# Patient Record
Sex: Female | Born: 1935 | Race: White | Hispanic: No | Smoking: Former smoker
Health system: Southern US, Community
[De-identification: ages and names within clinical notes are randomized; demographics above are authoritative.]

## PROBLEM LIST (undated history)

## (undated) DIAGNOSIS — R002 Palpitations: Secondary | ICD-10-CM

## (undated) DIAGNOSIS — C50919 Malignant neoplasm of unspecified site of unspecified female breast: Secondary | ICD-10-CM

## (undated) DIAGNOSIS — I129 Hypertensive chronic kidney disease with stage 1 through stage 4 chronic kidney disease, or unspecified chronic kidney disease: Secondary | ICD-10-CM

## (undated) DIAGNOSIS — E782 Mixed hyperlipidemia: Secondary | ICD-10-CM

## (undated) DIAGNOSIS — R7303 Prediabetes: Secondary | ICD-10-CM

## (undated) DIAGNOSIS — I4891 Unspecified atrial fibrillation: Secondary | ICD-10-CM

## (undated) DIAGNOSIS — F419 Anxiety disorder, unspecified: Secondary | ICD-10-CM

## (undated) HISTORY — PX: OTHER SURGICAL HISTORY: SHX169

## (undated) HISTORY — PX: BREAST LUMPECTOMY: SHX2

## (undated) HISTORY — DX: Hypertensive chronic kidney disease with stage 1 through stage 4 chronic kidney disease, or unspecified chronic kidney disease: I12.9

## (undated) HISTORY — DX: Malignant neoplasm of unspecified site of unspecified female breast: C50.919

## (undated) HISTORY — DX: Mixed hyperlipidemia: E78.2

## (undated) HISTORY — DX: Anxiety disorder, unspecified: F41.9

## (undated) HISTORY — PX: CARDIOVASCULAR STRESS TEST: SHX262

## (undated) HISTORY — DX: Palpitations: R00.2

---

## 1996-04-02 HISTORY — PX: BREAST LUMPECTOMY: SHX2

## 1997-09-10 ENCOUNTER — Other Ambulatory Visit: Admission: RE | Admit: 1997-09-10 | Discharge: 1997-09-10 | Payer: Self-pay | Admitting: Hematology and Oncology

## 1998-06-20 ENCOUNTER — Other Ambulatory Visit: Admission: RE | Admit: 1998-06-20 | Discharge: 1998-06-20 | Payer: Self-pay | Admitting: Emergency Medicine

## 1998-09-30 ENCOUNTER — Ambulatory Visit (HOSPITAL_COMMUNITY): Admission: RE | Admit: 1998-09-30 | Discharge: 1998-09-30 | Payer: Self-pay | Admitting: Gastroenterology

## 1998-09-30 ENCOUNTER — Encounter: Payer: Self-pay | Admitting: Gastroenterology

## 1999-06-12 ENCOUNTER — Other Ambulatory Visit: Admission: RE | Admit: 1999-06-12 | Discharge: 1999-06-12 | Payer: Self-pay | Admitting: Emergency Medicine

## 1999-06-27 ENCOUNTER — Encounter: Payer: Self-pay | Admitting: Hematology and Oncology

## 1999-06-27 ENCOUNTER — Encounter: Admission: RE | Admit: 1999-06-27 | Discharge: 1999-06-27 | Payer: Self-pay | Admitting: Hematology and Oncology

## 1999-12-19 ENCOUNTER — Encounter: Admission: RE | Admit: 1999-12-19 | Discharge: 1999-12-19 | Payer: Self-pay | Admitting: Hematology and Oncology

## 1999-12-19 ENCOUNTER — Encounter: Payer: Self-pay | Admitting: Hematology and Oncology

## 2000-06-19 ENCOUNTER — Other Ambulatory Visit: Admission: RE | Admit: 2000-06-19 | Discharge: 2000-06-19 | Payer: Self-pay | Admitting: Emergency Medicine

## 2000-07-02 ENCOUNTER — Encounter: Payer: Self-pay | Admitting: Hematology and Oncology

## 2000-07-02 ENCOUNTER — Ambulatory Visit (HOSPITAL_COMMUNITY): Admission: RE | Admit: 2000-07-02 | Discharge: 2000-07-02 | Payer: Self-pay | Admitting: Hematology and Oncology

## 2000-08-17 ENCOUNTER — Encounter: Payer: Self-pay | Admitting: Emergency Medicine

## 2000-08-17 ENCOUNTER — Emergency Department (HOSPITAL_COMMUNITY): Admission: EM | Admit: 2000-08-17 | Discharge: 2000-08-17 | Payer: Self-pay | Admitting: Emergency Medicine

## 2000-12-19 ENCOUNTER — Encounter: Admission: RE | Admit: 2000-12-19 | Discharge: 2000-12-19 | Payer: Self-pay | Admitting: Hematology and Oncology

## 2000-12-19 ENCOUNTER — Encounter: Payer: Self-pay | Admitting: Hematology and Oncology

## 2001-12-03 ENCOUNTER — Encounter: Payer: Self-pay | Admitting: Hematology and Oncology

## 2001-12-03 ENCOUNTER — Encounter: Admission: RE | Admit: 2001-12-03 | Discharge: 2001-12-03 | Payer: Self-pay | Admitting: Hematology and Oncology

## 2002-05-22 ENCOUNTER — Encounter: Admission: RE | Admit: 2002-05-22 | Discharge: 2002-05-22 | Payer: Self-pay | Admitting: Emergency Medicine

## 2002-05-22 ENCOUNTER — Encounter: Payer: Self-pay | Admitting: Emergency Medicine

## 2002-10-02 ENCOUNTER — Other Ambulatory Visit: Admission: RE | Admit: 2002-10-02 | Discharge: 2002-10-02 | Payer: Self-pay | Admitting: Emergency Medicine

## 2002-12-29 ENCOUNTER — Encounter: Admission: RE | Admit: 2002-12-29 | Discharge: 2002-12-29 | Payer: Self-pay | Admitting: *Deleted

## 2002-12-29 ENCOUNTER — Encounter: Payer: Self-pay | Admitting: *Deleted

## 2003-06-05 ENCOUNTER — Emergency Department (HOSPITAL_COMMUNITY): Admission: EM | Admit: 2003-06-05 | Discharge: 2003-06-05 | Payer: Self-pay | Admitting: Emergency Medicine

## 2004-01-04 ENCOUNTER — Encounter: Admission: RE | Admit: 2004-01-04 | Discharge: 2004-01-04 | Payer: Self-pay | Admitting: Oncology

## 2004-08-16 ENCOUNTER — Ambulatory Visit: Payer: Self-pay | Admitting: Internal Medicine

## 2004-11-05 ENCOUNTER — Emergency Department (HOSPITAL_COMMUNITY): Admission: EM | Admit: 2004-11-05 | Discharge: 2004-11-05 | Payer: Self-pay | Admitting: Emergency Medicine

## 2005-01-31 ENCOUNTER — Encounter: Admission: RE | Admit: 2005-01-31 | Discharge: 2005-01-31 | Payer: Self-pay | Admitting: Internal Medicine

## 2005-02-07 ENCOUNTER — Ambulatory Visit: Payer: Self-pay | Admitting: Internal Medicine

## 2005-02-08 ENCOUNTER — Ambulatory Visit (HOSPITAL_COMMUNITY): Admission: RE | Admit: 2005-02-08 | Discharge: 2005-02-08 | Payer: Self-pay | Admitting: Internal Medicine

## 2005-03-07 DIAGNOSIS — I129 Hypertensive chronic kidney disease with stage 1 through stage 4 chronic kidney disease, or unspecified chronic kidney disease: Secondary | ICD-10-CM

## 2005-03-07 HISTORY — DX: Hypertensive chronic kidney disease with stage 1 through stage 4 chronic kidney disease, or unspecified chronic kidney disease: I12.9

## 2005-08-07 ENCOUNTER — Ambulatory Visit: Payer: Self-pay | Admitting: Internal Medicine

## 2005-08-09 LAB — CBC WITH DIFFERENTIAL/PLATELET
BASO%: 1.1 % (ref 0.0–2.0)
HCT: 36.7 % (ref 34.8–46.6)
LYMPH%: 32.2 % (ref 14.0–48.0)
MCHC: 34.3 g/dL (ref 32.0–36.0)
MCV: 92 fL (ref 81.0–101.0)
MONO#: 0.6 10*3/uL (ref 0.1–0.9)
MONO%: 14 % — ABNORMAL HIGH (ref 0.0–13.0)
NEUT%: 47.4 % (ref 39.6–76.8)
Platelets: 168 10*3/uL (ref 145–400)
RBC: 3.99 10*6/uL (ref 3.70–5.32)

## 2005-08-09 LAB — COMPREHENSIVE METABOLIC PANEL
Alkaline Phosphatase: 65 U/L (ref 39–117)
CO2: 28 mEq/L (ref 19–32)
Creatinine, Ser: 0.8 mg/dL (ref 0.4–1.2)
Glucose, Bld: 123 mg/dL — ABNORMAL HIGH (ref 70–99)
Sodium: 140 mEq/L (ref 135–145)
Total Bilirubin: 0.4 mg/dL (ref 0.3–1.2)
Total Protein: 6.3 g/dL (ref 6.0–8.3)

## 2005-08-09 LAB — CANCER ANTIGEN 27.29: CA 27.29: 43 U/mL — ABNORMAL HIGH (ref 0–39)

## 2006-02-01 ENCOUNTER — Encounter: Admission: RE | Admit: 2006-02-01 | Discharge: 2006-02-01 | Payer: Self-pay | Admitting: Emergency Medicine

## 2006-02-05 ENCOUNTER — Ambulatory Visit: Payer: Self-pay | Admitting: Internal Medicine

## 2006-02-07 ENCOUNTER — Ambulatory Visit (HOSPITAL_COMMUNITY): Admission: RE | Admit: 2006-02-07 | Discharge: 2006-02-07 | Payer: Self-pay | Admitting: Internal Medicine

## 2006-02-07 LAB — CBC WITH DIFFERENTIAL/PLATELET
BASO%: 0.4 % (ref 0.0–2.0)
Basophils Absolute: 0 10*3/uL (ref 0.0–0.1)
EOS%: 2.8 % (ref 0.0–7.0)
Eosinophils Absolute: 0.2 10*3/uL (ref 0.0–0.5)
HCT: 36 % (ref 34.8–46.6)
HGB: 12.4 g/dL (ref 11.6–15.9)
LYMPH%: 23.4 % (ref 14.0–48.0)
MCH: 31.8 pg (ref 26.0–34.0)
MCHC: 34.3 g/dL (ref 32.0–36.0)
MCV: 92.5 fL (ref 81.0–101.0)
MONO#: 0.6 10*3/uL (ref 0.1–0.9)
MONO%: 9.4 % (ref 0.0–13.0)
NEUT#: 3.9 10*3/uL (ref 1.5–6.5)
NEUT%: 64 % (ref 39.6–76.8)
Platelets: 195 10*3/uL (ref 145–400)
RBC: 3.89 10*6/uL (ref 3.70–5.32)
RDW: 13.1 % (ref 11.3–14.5)
WBC: 6.1 10*3/uL (ref 3.9–10.0)
lymph#: 1.4 10*3/uL (ref 0.9–3.3)

## 2006-02-07 LAB — COMPREHENSIVE METABOLIC PANEL
ALT: 16 U/L (ref 0–35)
AST: 18 U/L (ref 0–37)
Albumin: 4.1 g/dL (ref 3.5–5.2)
Alkaline Phosphatase: 70 U/L (ref 39–117)
BUN: 17 mg/dL (ref 6–23)
CO2: 30 mEq/L (ref 19–32)
Calcium: 9.8 mg/dL (ref 8.4–10.5)
Chloride: 101 mEq/L (ref 96–112)
Creatinine, Ser: 0.74 mg/dL (ref 0.40–1.20)
Glucose, Bld: 109 mg/dL — ABNORMAL HIGH (ref 70–99)
Potassium: 4.5 mEq/L (ref 3.5–5.3)
Sodium: 139 mEq/L (ref 135–145)
Total Bilirubin: 0.4 mg/dL (ref 0.3–1.2)
Total Protein: 6.5 g/dL (ref 6.0–8.3)

## 2006-02-07 LAB — CANCER ANTIGEN 27.29: CA 27.29: 34 U/mL (ref 0–39)

## 2006-08-06 ENCOUNTER — Ambulatory Visit: Payer: Self-pay | Admitting: Internal Medicine

## 2006-08-08 LAB — CBC WITH DIFFERENTIAL/PLATELET
Basophils Absolute: 0 10*3/uL (ref 0.0–0.1)
Eosinophils Absolute: 0.2 10*3/uL (ref 0.0–0.5)
HCT: 36 % (ref 34.8–46.6)
HGB: 12.7 g/dL (ref 11.6–15.9)
LYMPH%: 25 % (ref 14.0–48.0)
MONO#: 0.8 10*3/uL (ref 0.1–0.9)
NEUT%: 54.2 % (ref 39.6–76.8)
Platelets: 176 10*3/uL (ref 145–400)
WBC: 4.9 10*3/uL (ref 3.9–10.0)
lymph#: 1.2 10*3/uL (ref 0.9–3.3)

## 2006-08-08 LAB — COMPREHENSIVE METABOLIC PANEL
ALT: 17 U/L (ref 0–35)
BUN: 16 mg/dL (ref 6–23)
CO2: 30 mEq/L (ref 19–32)
Calcium: 9.7 mg/dL (ref 8.4–10.5)
Chloride: 103 mEq/L (ref 96–112)
Creatinine, Ser: 0.73 mg/dL (ref 0.40–1.20)
Glucose, Bld: 79 mg/dL (ref 70–99)
Total Bilirubin: 0.5 mg/dL (ref 0.3–1.2)

## 2006-08-08 LAB — CANCER ANTIGEN 27.29: CA 27.29: 39 U/mL (ref 0–39)

## 2007-02-03 ENCOUNTER — Ambulatory Visit: Payer: Self-pay | Admitting: Internal Medicine

## 2007-02-04 ENCOUNTER — Encounter: Admission: RE | Admit: 2007-02-04 | Discharge: 2007-02-04 | Payer: Self-pay | Admitting: Emergency Medicine

## 2007-02-05 ENCOUNTER — Ambulatory Visit (HOSPITAL_COMMUNITY): Admission: RE | Admit: 2007-02-05 | Discharge: 2007-02-05 | Payer: Self-pay | Admitting: Internal Medicine

## 2007-02-05 LAB — CBC WITH DIFFERENTIAL/PLATELET
BASO%: 1.1 % (ref 0.0–2.0)
Basophils Absolute: 0.1 10*3/uL (ref 0.0–0.1)
EOS%: 4 % (ref 0.0–7.0)
HGB: 12.8 g/dL (ref 11.6–15.9)
MCH: 31.9 pg (ref 26.0–34.0)
MCV: 91.3 fL (ref 81.0–101.0)
MONO%: 16.2 % — ABNORMAL HIGH (ref 0.0–13.0)
RBC: 4.02 10*6/uL (ref 3.70–5.32)
RDW: 13.1 % (ref 11.3–14.5)
lymph#: 1.4 10*3/uL (ref 0.9–3.3)

## 2007-02-05 LAB — COMPREHENSIVE METABOLIC PANEL
ALT: 18 U/L (ref 0–35)
AST: 20 U/L (ref 0–37)
Albumin: 4.2 g/dL (ref 3.5–5.2)
Alkaline Phosphatase: 76 U/L (ref 39–117)
BUN: 21 mg/dL (ref 6–23)
Calcium: 9.8 mg/dL (ref 8.4–10.5)
Chloride: 103 mEq/L (ref 96–112)
Potassium: 3.4 mEq/L — ABNORMAL LOW (ref 3.5–5.3)
Sodium: 141 mEq/L (ref 135–145)
Total Protein: 6.8 g/dL (ref 6.0–8.3)

## 2007-11-03 ENCOUNTER — Encounter: Admission: RE | Admit: 2007-11-03 | Discharge: 2007-11-03 | Payer: Self-pay | Admitting: Family Medicine

## 2008-02-05 ENCOUNTER — Encounter: Admission: RE | Admit: 2008-02-05 | Discharge: 2008-02-05 | Payer: Self-pay | Admitting: Internal Medicine

## 2008-02-10 ENCOUNTER — Ambulatory Visit: Payer: Self-pay | Admitting: Internal Medicine

## 2008-02-12 LAB — CBC WITH DIFFERENTIAL/PLATELET
BASO%: 0.8 % (ref 0.0–2.0)
Basophils Absolute: 0 10*3/uL (ref 0.0–0.1)
EOS%: 3.7 % (ref 0.0–7.0)
Eosinophils Absolute: 0.2 10*3/uL (ref 0.0–0.5)
HCT: 36 % (ref 34.8–46.6)
HGB: 12.4 g/dL (ref 11.6–15.9)
LYMPH%: 22.8 % (ref 14.0–48.0)
MCH: 32 pg (ref 26.0–34.0)
MCHC: 34.4 g/dL (ref 32.0–36.0)
MCV: 92.9 fL (ref 81.0–101.0)
MONO#: 0.9 10*3/uL (ref 0.1–0.9)
MONO%: 15.9 % — ABNORMAL HIGH (ref 0.0–13.0)
NEUT#: 3.1 10*3/uL (ref 1.5–6.5)
NEUT%: 56.8 % (ref 39.6–76.8)
Platelets: 182 10*3/uL (ref 145–400)
RBC: 3.87 10*6/uL (ref 3.70–5.32)
RDW: 13.4 % (ref 11.3–14.5)
WBC: 5.5 10*3/uL (ref 3.9–10.0)
lymph#: 1.2 10*3/uL (ref 0.9–3.3)

## 2008-02-12 LAB — COMPREHENSIVE METABOLIC PANEL
ALT: 18 U/L (ref 0–35)
AST: 20 U/L (ref 0–37)
Albumin: 4.1 g/dL (ref 3.5–5.2)
Alkaline Phosphatase: 63 U/L (ref 39–117)
BUN: 19 mg/dL (ref 6–23)
CO2: 29 mEq/L (ref 19–32)
Calcium: 9.7 mg/dL (ref 8.4–10.5)
Chloride: 100 mEq/L (ref 96–112)
Creatinine, Ser: 0.79 mg/dL (ref 0.40–1.20)
Glucose, Bld: 87 mg/dL (ref 70–99)
Potassium: 3.8 mEq/L (ref 3.5–5.3)
Sodium: 137 mEq/L (ref 135–145)
Total Bilirubin: 0.5 mg/dL (ref 0.3–1.2)
Total Protein: 6.3 g/dL (ref 6.0–8.3)

## 2008-02-12 LAB — CANCER ANTIGEN 27.29: CA 27.29: 42 U/mL — ABNORMAL HIGH (ref 0–39)

## 2008-02-19 ENCOUNTER — Ambulatory Visit (HOSPITAL_COMMUNITY): Admission: RE | Admit: 2008-02-19 | Discharge: 2008-02-19 | Payer: Self-pay | Admitting: Internal Medicine

## 2009-02-03 ENCOUNTER — Ambulatory Visit: Payer: Self-pay | Admitting: Internal Medicine

## 2009-02-07 ENCOUNTER — Ambulatory Visit (HOSPITAL_COMMUNITY): Admission: RE | Admit: 2009-02-07 | Discharge: 2009-02-07 | Payer: Self-pay | Admitting: Internal Medicine

## 2009-02-07 LAB — CBC WITH DIFFERENTIAL/PLATELET
BASO%: 0.7 % (ref 0.0–2.0)
Basophils Absolute: 0 10*3/uL (ref 0.0–0.1)
EOS%: 4.9 % (ref 0.0–7.0)
Eosinophils Absolute: 0.2 10*3/uL (ref 0.0–0.5)
HCT: 36.5 % (ref 34.8–46.6)
HGB: 12.5 g/dL (ref 11.6–15.9)
LYMPH%: 25.8 % (ref 14.0–49.7)
MCH: 32.2 pg (ref 25.1–34.0)
MCHC: 34.3 g/dL (ref 31.5–36.0)
MCV: 93.8 fL (ref 79.5–101.0)
MONO#: 0.8 10*3/uL (ref 0.1–0.9)
MONO%: 15.3 % — ABNORMAL HIGH (ref 0.0–14.0)
NEUT#: 2.7 10*3/uL (ref 1.5–6.5)
NEUT%: 53.3 % (ref 38.4–76.8)
Platelets: 162 10*3/uL (ref 145–400)
RBC: 3.89 10*6/uL (ref 3.70–5.45)
RDW: 13.5 % (ref 11.2–14.5)
WBC: 5 10*3/uL (ref 3.9–10.3)
lymph#: 1.3 10*3/uL (ref 0.9–3.3)

## 2009-02-07 LAB — COMPREHENSIVE METABOLIC PANEL
ALT: 12 U/L (ref 0–35)
AST: 17 U/L (ref 0–37)
Albumin: 4 g/dL (ref 3.5–5.2)
Alkaline Phosphatase: 64 U/L (ref 39–117)
BUN: 19 mg/dL (ref 6–23)
CO2: 31 mEq/L (ref 19–32)
Calcium: 9.6 mg/dL (ref 8.4–10.5)
Chloride: 104 mEq/L (ref 96–112)
Creatinine, Ser: 0.75 mg/dL (ref 0.40–1.20)
Glucose, Bld: 112 mg/dL — ABNORMAL HIGH (ref 70–99)
Potassium: 3.9 mEq/L (ref 3.5–5.3)
Sodium: 141 mEq/L (ref 135–145)
Total Bilirubin: 0.4 mg/dL (ref 0.3–1.2)
Total Protein: 6 g/dL (ref 6.0–8.3)

## 2009-02-07 LAB — CANCER ANTIGEN 27.29: CA 27.29: 38 U/mL (ref 0–39)

## 2009-02-08 ENCOUNTER — Encounter: Admission: RE | Admit: 2009-02-08 | Discharge: 2009-02-08 | Payer: Self-pay | Admitting: Internal Medicine

## 2010-02-09 ENCOUNTER — Encounter: Admission: RE | Admit: 2010-02-09 | Discharge: 2010-02-09 | Payer: Self-pay | Admitting: Internal Medicine

## 2010-03-07 ENCOUNTER — Ambulatory Visit: Payer: Self-pay | Admitting: Internal Medicine

## 2010-03-09 ENCOUNTER — Ambulatory Visit (HOSPITAL_COMMUNITY)
Admission: RE | Admit: 2010-03-09 | Discharge: 2010-03-09 | Payer: Self-pay | Source: Home / Self Care | Admitting: Internal Medicine

## 2010-03-09 LAB — CBC WITH DIFFERENTIAL/PLATELET
BASO%: 0.6 % (ref 0.0–2.0)
Basophils Absolute: 0 10*3/uL (ref 0.0–0.1)
EOS%: 2.5 % (ref 0.0–7.0)
Eosinophils Absolute: 0.2 10*3/uL (ref 0.0–0.5)
HCT: 37.9 % (ref 34.8–46.6)
HGB: 12.7 g/dL (ref 11.6–15.9)
LYMPH%: 34 % (ref 14.0–49.7)
MCH: 30.8 pg (ref 25.1–34.0)
MCHC: 33.5 g/dL (ref 31.5–36.0)
MCV: 92 fL (ref 79.5–101.0)
MONO#: 0.9 10*3/uL (ref 0.1–0.9)
MONO%: 13.7 % (ref 0.0–14.0)
NEUT#: 3.1 10*3/uL (ref 1.5–6.5)
NEUT%: 49.2 % (ref 38.4–76.8)
Platelets: 185 10*3/uL (ref 145–400)
RBC: 4.12 10*6/uL (ref 3.70–5.45)
RDW: 13.4 % (ref 11.2–14.5)
WBC: 6.4 10*3/uL (ref 3.9–10.3)
lymph#: 2.2 10*3/uL (ref 0.9–3.3)

## 2010-03-09 LAB — COMPREHENSIVE METABOLIC PANEL
ALT: 15 U/L (ref 0–35)
AST: 21 U/L (ref 0–37)
Albumin: 4.2 g/dL (ref 3.5–5.2)
Alkaline Phosphatase: 58 U/L (ref 39–117)
BUN: 18 mg/dL (ref 6–23)
CO2: 31 mEq/L (ref 19–32)
Calcium: 9.6 mg/dL (ref 8.4–10.5)
Chloride: 100 mEq/L (ref 96–112)
Creatinine, Ser: 0.67 mg/dL (ref 0.40–1.20)
Glucose, Bld: 115 mg/dL — ABNORMAL HIGH (ref 70–99)
Potassium: 3.9 mEq/L (ref 3.5–5.3)
Sodium: 140 mEq/L (ref 135–145)
Total Bilirubin: 0.4 mg/dL (ref 0.3–1.2)
Total Protein: 6.2 g/dL (ref 6.0–8.3)

## 2010-03-09 LAB — CANCER ANTIGEN 27.29: CA 27.29: 42 U/mL — ABNORMAL HIGH (ref 0–39)

## 2011-01-30 ENCOUNTER — Other Ambulatory Visit: Payer: Self-pay | Admitting: Internal Medicine

## 2011-01-30 DIAGNOSIS — Z1231 Encounter for screening mammogram for malignant neoplasm of breast: Secondary | ICD-10-CM

## 2011-03-13 ENCOUNTER — Ambulatory Visit
Admission: RE | Admit: 2011-03-13 | Discharge: 2011-03-13 | Disposition: A | Discharge status: Home / Self Care | Payer: Medicare Other | Source: Ambulatory Visit | Attending: Internal Medicine | Admitting: Internal Medicine

## 2011-03-13 DIAGNOSIS — Z1231 Encounter for screening mammogram for malignant neoplasm of breast: Secondary | ICD-10-CM

## 2011-03-16 ENCOUNTER — Ambulatory Visit (HOSPITAL_COMMUNITY)
Admission: RE | Admit: 2011-03-16 | Discharge: 2011-03-16 | Disposition: A | Discharge status: Home / Self Care | Payer: Medicare Other | Source: Ambulatory Visit | Attending: Internal Medicine | Admitting: Internal Medicine

## 2011-03-16 ENCOUNTER — Other Ambulatory Visit: Payer: Self-pay | Admitting: Internal Medicine

## 2011-03-16 ENCOUNTER — Other Ambulatory Visit (HOSPITAL_BASED_OUTPATIENT_CLINIC_OR_DEPARTMENT_OTHER): Payer: Medicare Other | Admitting: Lab

## 2011-03-16 DIAGNOSIS — C50919 Malignant neoplasm of unspecified site of unspecified female breast: Secondary | ICD-10-CM | POA: Insufficient documentation

## 2011-03-16 DIAGNOSIS — Z170 Estrogen receptor positive status [ER+]: Secondary | ICD-10-CM

## 2011-03-16 LAB — CBC WITH DIFFERENTIAL/PLATELET
EOS%: 2.3 % (ref 0.0–7.0)
Eosinophils Absolute: 0.2 10*3/uL (ref 0.0–0.5)
LYMPH%: 25.2 % (ref 14.0–49.7)
MCH: 30.3 pg (ref 25.1–34.0)
MCHC: 33.2 g/dL (ref 31.5–36.0)
MCV: 91 fL (ref 79.5–101.0)
MONO%: 13.9 % (ref 0.0–14.0)
NEUT#: 4.1 10*3/uL (ref 1.5–6.5)
Platelets: 185 10*3/uL (ref 145–400)
RBC: 4.23 10*6/uL (ref 3.70–5.45)
RDW: 13.4 % (ref 11.2–14.5)

## 2011-03-16 LAB — COMPREHENSIVE METABOLIC PANEL
ALT: 16 U/L (ref 0–35)
AST: 26 U/L (ref 0–37)
CO2: 28 mEq/L (ref 19–32)
Creatinine, Ser: 0.76 mg/dL (ref 0.50–1.10)
Total Bilirubin: 0.4 mg/dL (ref 0.3–1.2)

## 2011-03-16 LAB — CANCER ANTIGEN 27.29: CA 27.29: 39 U/mL (ref 0–39)

## 2011-03-19 ENCOUNTER — Encounter: Payer: Self-pay | Admitting: Internal Medicine

## 2011-03-19 ENCOUNTER — Telehealth: Payer: Self-pay | Admitting: Internal Medicine

## 2011-03-19 ENCOUNTER — Ambulatory Visit (HOSPITAL_BASED_OUTPATIENT_CLINIC_OR_DEPARTMENT_OTHER): Payer: Medicare Other | Admitting: Internal Medicine

## 2011-03-19 VITALS — BP 138/74 | HR 72 | Temp 96.8°F | Wt 197.7 lb

## 2011-03-19 DIAGNOSIS — C50919 Malignant neoplasm of unspecified site of unspecified female breast: Secondary | ICD-10-CM

## 2011-03-19 DIAGNOSIS — Z853 Personal history of malignant neoplasm of breast: Secondary | ICD-10-CM

## 2011-03-19 DIAGNOSIS — Z923 Personal history of irradiation: Secondary | ICD-10-CM

## 2011-03-19 NOTE — Progress Notes (Signed)
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  PRIOR THERAPY:  1. Status post left breast lumpectomy on 01/31/1998 with left axillary lymph node dissection, and the tumor was ER/PR positive, and there was no positive lymph node identified. 2. Status post radiotherapy to the left breast. 3. Status post 5 years of treatment with tamoxifen, ended 04/2002. 4. Status post 5 years of treatment with Femara 2.5 mg p.o. daily, completed 04/2007.  CURRENT THERAPY: Observation.  INTERVAL HISTORY: Alexandra Luna 77 y.o. female returns to the clinic today for annual followup visit. The patient is doing well today. She has no specific complaints. She denied having any significant chest pain or shortness breath, no cough or hemoptysis, no weight loss or night sweats. She had a recent chest x-ray as well as annual mammogram performaned in addition to blood work and she is here for evaluation and discussion of her lab and imaging studies.  MEDICAL HISTORY: Past Medical History  Diagnosis Date  . Breast cancer     ALLERGIES:  is allergic to adhesive; other; and penicillins.  MEDICATIONS:  Current Outpatient Prescriptions  Medication Sig Dispense Refill  . acetaminophen (TYLENOL) 500 MG tablet Take 500 mg by mouth every 6 (six) hours as needed.        . ALPRAZolam (XANAX) 0.25 MG tablet Take 0.25 mg by mouth at bedtime as needed.        Marland Kitchen atenolol (TENORMIN) 100 MG tablet Take 100 mg by mouth daily.        . calcium carbonate (OS-CAL) 600 MG TABS Take 600 mg by mouth 2 (two) times daily with a meal.        . fish oil-omega-3 fatty acids 1000 MG capsule Take 900 mg by mouth daily.        . hydrochlorothiazide (HYDRODIURIL) 25 MG tablet Take 25 mg by mouth daily.        Marland Kitchen loratadine (CLARITIN) 10 MG tablet Take 10 mg by mouth daily.        . Multiple Vitamins-Minerals (CENTRUM PO) Take by mouth daily.        .  rosuvastatin (CRESTOR) 10 MG tablet Take 10 mg by mouth daily.        . verapamil (COVERA HS) 240 MG (CO) 24 hr tablet Take 240 mg by mouth daily.          SURGICAL HISTORY:  Past Surgical History  Procedure Date  . Breast lumpectomy   . Tonsillectomy   . Cesarean section     x 3    REVIEW OF SYSTEMS:  A comprehensive review of systems was negative.   PHYSICAL EXAMINATION: General appearance: alert, cooperative and no distress Head: Normocephalic, without obvious abnormality, atraumatic Neck: no adenopathy Lymph nodes: Cervical, supraclavicular, and axillary nodes normal. Resp: clear to auscultation bilaterally Cardio: regular rate and rhythm, S1, S2 normal, no murmur, click, rub or gallop GI: soft, non-tender; bowel sounds normal; no masses,  no organomegaly Extremities: extremities normal, atraumatic, no cyanosis or edema Neurologic: Alert and oriented X 3, normal strength and tone. Normal symmetric reflexes. Normal coordination and gait Breast: no palpable masses. ECOG PERFORMANCE STATUS: 0 - Asymptomatic  Blood pressure 138/74, pulse 72, temperature 96.8 F (36 C), weight 197 lb 11.2 oz (89.676 kg).  LABORATORY DATA: Lab Results  Component Value Date   WBC 7.0 03/16/2011   HGB 12.8 03/16/2011   HCT 38.5 03/16/2011   MCV  91.0 03/16/2011   PLT 185 03/16/2011      Chemistry      Component Value Date/Time   NA 137 03/16/2011 1355   NA 137 03/16/2011 1355   K 4.1 03/16/2011 1355   K 4.1 03/16/2011 1355   CL 99 03/16/2011 1355   CL 99 03/16/2011 1355   CO2 28 03/16/2011 1355   CO2 28 03/16/2011 1355   BUN 20 03/16/2011 1355   BUN 20 03/16/2011 1355   CREATININE 0.76 03/16/2011 1355   CREATININE 0.76 03/16/2011 1355      Component Value Date/Time   CALCIUM 10.1 03/16/2011 1355   CALCIUM 10.1 03/16/2011 1355   ALKPHOS 55 03/16/2011 1355   ALKPHOS 55 03/16/2011 1355   AST 26 03/16/2011 1355   AST 26 03/16/2011 1355   ALT 16 03/16/2011 1355   ALT 16  03/16/2011 1355   BILITOT 0.4 03/16/2011 1355   BILITOT 0.4 03/16/2011 1355       RADIOGRAPHIC STUDIES: Dg Chest 2 View  03/16/2011  *RADIOLOGY REPORT*  Clinical Data: Breast cancer.  Chest two-view  Comparison:  03/09/2010.  Findings: Trachea is midline.  Heart size stable.  Biapical pleural thickening.  Probable mild scarring at the lung bases.  Lungs are otherwise clear.  No pleural fluid.  Surgical clips in the left axilla with postoperative changes in the left breast.  IMPRESSION: No acute findings.  Original Report Authenticated By: Reyes Ivan, M.D.   Mm Digital Screening  03/16/2011  DG SCREEN MAMMOGRAM BILATERAL Bilateral CC and MLO view(s) were taken.  DIGITAL SCREENING MAMMOGRAM WITH CAD: There are scattered fibroglandular densities.  No masses or malignant type calcifications are  identified.  Compared with prior studies.  Images were processed with CAD.  IMPRESSION: No specific mammographic evidence of malignancy.  Next screening mammogram is recommended in one  year.  A result letter of this screening mammogram will be mailed directly to the patient.  ASSESSMENT: Negative - BI-RADS 1  Screening mammogram in 1 year. ,    ASSESSMENT: This is a very pleasant 77 years old white female with a history of stage I invasive left breast carcinoma status post left breast lumpectomy followed by radiotherapy as well as 77 years old hormonal treatment. The patient is doing fine and she has no evidence for disease recurrence. I discussed the lab as well as the imaging studies was The patient.   PLAN: I recommended for the patient in followup visit in one year with repeat chest x-ray, lab work as well as mammogram. The patient was advised to call me immediately if she has any concerning symptoms in the interval.   All questions were answered. The patient knows to call the clinic with any problems, questions or concerns. We can certainly see the patient much sooner if necessary.

## 2011-03-19 NOTE — Telephone Encounter (Signed)
gv pt appts for jan 2014 including cxr to be done same day as lab 04/15/2012 @ 2 pm. Per MM/pt lb f/u jan 2014. Per MM no lb needed feb 2013 - lb/cxr/fu jan 2014.

## 2011-08-29 HISTORY — PX: DOPPLER ECHOCARDIOGRAPHY: SHX263

## 2011-10-16 ENCOUNTER — Other Ambulatory Visit: Payer: Self-pay | Admitting: *Deleted

## 2012-03-10 ENCOUNTER — Telehealth: Payer: Self-pay | Admitting: Internal Medicine

## 2012-03-10 ENCOUNTER — Other Ambulatory Visit: Payer: Self-pay | Admitting: Internal Medicine

## 2012-03-10 ENCOUNTER — Other Ambulatory Visit: Payer: Self-pay | Admitting: Medical Oncology

## 2012-03-10 DIAGNOSIS — Z1231 Encounter for screening mammogram for malignant neoplasm of breast: Secondary | ICD-10-CM

## 2012-03-10 NOTE — Progress Notes (Signed)
Pt requests to r.s lab and follow up to a few dasy after her  Mammogram on  jan 17th-Onc tx request sent

## 2012-03-10 NOTE — Telephone Encounter (Signed)
called pt as she had lm to r/s appts to after her mammo....done/aware      Alexandra Luna

## 2012-04-14 ENCOUNTER — Other Ambulatory Visit: Payer: Medicare Other | Admitting: Lab

## 2012-04-17 ENCOUNTER — Encounter (INDEPENDENT_AMBULATORY_CARE_PROVIDER_SITE_OTHER): Payer: Medicare Other | Admitting: Ophthalmology

## 2012-04-17 ENCOUNTER — Ambulatory Visit: Payer: Medicare Other | Admitting: Internal Medicine

## 2012-04-17 DIAGNOSIS — H35039 Hypertensive retinopathy, unspecified eye: Secondary | ICD-10-CM

## 2012-04-17 DIAGNOSIS — H43819 Vitreous degeneration, unspecified eye: Secondary | ICD-10-CM

## 2012-04-17 DIAGNOSIS — H3530 Unspecified macular degeneration: Secondary | ICD-10-CM

## 2012-04-17 DIAGNOSIS — I10 Essential (primary) hypertension: Secondary | ICD-10-CM

## 2012-04-18 ENCOUNTER — Ambulatory Visit
Admission: RE | Admit: 2012-04-18 | Discharge: 2012-04-18 | Disposition: A | Discharge status: Home / Self Care | Payer: Medicare Other | Source: Ambulatory Visit | Attending: Internal Medicine | Admitting: Internal Medicine

## 2012-04-18 ENCOUNTER — Telehealth: Payer: Self-pay | Admitting: Internal Medicine

## 2012-04-18 DIAGNOSIS — Z1231 Encounter for screening mammogram for malignant neoplasm of breast: Secondary | ICD-10-CM

## 2012-04-18 NOTE — Telephone Encounter (Signed)
s/w pt and r/s her appt to 1/27 as dr mkm is out of the office on 1/23    anne

## 2012-04-21 ENCOUNTER — Other Ambulatory Visit (HOSPITAL_BASED_OUTPATIENT_CLINIC_OR_DEPARTMENT_OTHER): Payer: Medicare Other | Admitting: Lab

## 2012-04-21 ENCOUNTER — Ambulatory Visit (HOSPITAL_COMMUNITY)
Admission: RE | Admit: 2012-04-21 | Discharge: 2012-04-21 | Disposition: A | Discharge status: Home / Self Care | Payer: Medicare Other | Source: Ambulatory Visit | Attending: Internal Medicine | Admitting: Internal Medicine

## 2012-04-21 DIAGNOSIS — C50919 Malignant neoplasm of unspecified site of unspecified female breast: Secondary | ICD-10-CM

## 2012-04-21 DIAGNOSIS — Z853 Personal history of malignant neoplasm of breast: Secondary | ICD-10-CM | POA: Insufficient documentation

## 2012-04-21 LAB — COMPREHENSIVE METABOLIC PANEL (CC13)
ALT: 12 U/L (ref 0–55)
Albumin: 3.1 g/dL — ABNORMAL LOW (ref 3.5–5.0)
CO2: 34 mEq/L — ABNORMAL HIGH (ref 22–29)
Calcium: 10.1 mg/dL (ref 8.4–10.4)
Chloride: 102 mEq/L (ref 98–107)
Creatinine: 0.8 mg/dL (ref 0.6–1.1)

## 2012-04-21 LAB — CBC WITH DIFFERENTIAL/PLATELET
BASO%: 0.8 % (ref 0.0–2.0)
HCT: 38.3 % (ref 34.8–46.6)
MCHC: 32.9 g/dL (ref 31.5–36.0)
MONO#: 0.7 10*3/uL (ref 0.1–0.9)
RBC: 4.04 10*6/uL (ref 3.70–5.45)
RDW: 13.7 % (ref 11.2–14.5)
WBC: 4.7 10*3/uL (ref 3.9–10.3)
lymph#: 1.6 10*3/uL (ref 0.9–3.3)
nRBC: 0 % (ref 0–0)

## 2012-04-24 ENCOUNTER — Ambulatory Visit: Payer: Medicare Other | Admitting: Internal Medicine

## 2012-04-28 ENCOUNTER — Ambulatory Visit (HOSPITAL_BASED_OUTPATIENT_CLINIC_OR_DEPARTMENT_OTHER): Payer: Medicare Other | Admitting: Internal Medicine

## 2012-04-28 ENCOUNTER — Encounter: Payer: Self-pay | Admitting: Internal Medicine

## 2012-04-28 ENCOUNTER — Telehealth: Payer: Self-pay | Admitting: Internal Medicine

## 2012-04-28 VITALS — BP 146/76 | HR 72 | Temp 98.7°F | Resp 20 | Ht 66.0 in | Wt 184.9 lb

## 2012-04-28 DIAGNOSIS — Z853 Personal history of malignant neoplasm of breast: Secondary | ICD-10-CM

## 2012-04-28 NOTE — Progress Notes (Signed)
[facility] Telephone:([phone]   [phone]  OFFICE PROGRESS NOTE  Emeterio Reeve, MD [address] 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   PRIOR THERAPY:  1. Status post left breast lumpectomy on 01/31/1998 with left axillary lymph node dissection, and the tumor was ER/PR positive, and there was no positive lymph node identified.  2. Status post radiotherapy to the left breast.  3. Status post 5 years of treatment with tamoxifen, ended 04/2002.  4. Status post 5 years of treatment with Femara 2.5 mg p.o. daily, completed 04/2007.   CURRENT THERAPY: Observation.  INTERVAL HISTORY: Alexandra Luna 79 y.o. female returns to the clinic today for annual followup visit. The patient is feeling fine today with no specific complaints. Unfortunately she lost her husband in October of 2013. The patient denied having any significant weight loss or night sweats. She denied having any chest pain, shortness breath, cough or hemoptysis. She had repeat CBC, comprehensive metabolic panel and chest x-ray performed recently and she is here for evaluation and discussion of her lab and imaging results. The patient also had a recent mammogram that was unremarkable.  MEDICAL HISTORY: Past Medical History  Diagnosis Date  . Breast cancer     ALLERGIES:  is allergic to adhesive; other; and penicillins.  MEDICATIONS:  Current Outpatient Prescriptions  Medication Sig Dispense Refill  . acetaminophen (TYLENOL) 500 MG tablet Take 500 mg by mouth every 6 (six) hours as needed.        . ALPRAZolam (XANAX) 0.25 MG tablet Take 0.25 mg by mouth at bedtime as needed.        Marland Kitchen atenolol (TENORMIN) 100 MG tablet Take 125 mg by mouth daily.       . calcium carbonate (OS-CAL) 600 MG TABS Take 600 mg by mouth 2 (two) times daily with a meal.        . fish oil-omega-3 fatty acids 1000 MG capsule Take 2,400 mg by  mouth daily.       . hydrochlorothiazide (HYDRODIURIL) 25 MG tablet Take 25 mg by mouth daily.        Marland Kitchen loratadine (CLARITIN) 10 MG tablet Take 10 mg by mouth daily.        . Multiple Vitamins-Minerals (ICAPS MV) TABS Take 1 tablet by mouth daily.      . rosuvastatin (CRESTOR) 10 MG tablet Take 10 mg by mouth daily.        . valsartan (DIOVAN) 40 MG tablet Take 40 mg by mouth daily.      . verapamil (COVERA HS) 240 MG (CO) 24 hr tablet Take 240 mg by mouth daily.          SURGICAL HISTORY:  Past Surgical History  Procedure Date  . Breast lumpectomy   . Tonsillectomy   . Cesarean section     x 3    REVIEW OF SYSTEMS:  A comprehensive review of systems was negative.   PHYSICAL EXAMINATION: General appearance: alert, cooperative and no distress Head: Normocephalic, without obvious abnormality, atraumatic Neck: no adenopathy Lymph nodes: Cervical, supraclavicular, and axillary nodes normal. Resp: clear to auscultation bilaterally Cardio: regular rate and rhythm, S1, S2 normal, no murmur, click, rub or gallop GI: soft, non-tender; bowel sounds normal; no masses,  no organomegaly Extremities: extremities normal, atraumatic, no cyanosis or edema Breast exam done with the chappron of my nurse Willette Pa showed no palpable masses  bilaterally and no palpable lymphadenopathy in the axilla.  ECOG PERFORMANCE STATUS: 1 - Symptomatic but completely ambulatory  There were no vitals taken for this visit.  LABORATORY DATA: Lab Results  Component Value Date   WBC 4.7 04/21/2012   HGB 12.6 04/21/2012   HCT 38.3 04/21/2012   MCV 94.8 04/21/2012   PLT 154 04/21/2012      Chemistry      Component Value Date/Time   NA 141 04/21/2012 0925   NA 137 03/16/2011 1355   NA 137 03/16/2011 1355   K 3.6 04/21/2012 0925   K 4.1 03/16/2011 1355   K 4.1 03/16/2011 1355   CL 102 04/21/2012 0925   CL 99 03/16/2011 1355   CL 99 03/16/2011 1355   CO2 34* 04/21/2012 0925   CO2 28 03/16/2011 1355   CO2 28  03/16/2011 1355   BUN 18.0 04/21/2012 0925   BUN 20 03/16/2011 1355   BUN 20 03/16/2011 1355   CREATININE 0.8 04/21/2012 0925   CREATININE 0.76 03/16/2011 1355   CREATININE 0.76 03/16/2011 1355      Component Value Date/Time   CALCIUM 10.1 04/21/2012 0925   CALCIUM 10.1 03/16/2011 1355   CALCIUM 10.1 03/16/2011 1355   ALKPHOS 59 04/21/2012 0925   ALKPHOS 55 03/16/2011 1355   ALKPHOS 55 03/16/2011 1355   AST 19 04/21/2012 0925   AST 26 03/16/2011 1355   AST 26 03/16/2011 1355   ALT 12 04/21/2012 0925   ALT 16 03/16/2011 1355   ALT 16 03/16/2011 1355   BILITOT 0.50 04/21/2012 0925   BILITOT 0.4 03/16/2011 1355   BILITOT 0.4 03/16/2011 1355       RADIOGRAPHIC STUDIES: Dg Chest 2 View  04/21/2012  *RADIOLOGY REPORT*  Clinical Data: Personal history of carcinoma of the breast  CHEST - 2 VIEW  Comparison: 03/16/2011  Findings: The heart and pulmonary vascularity are within normal limits.  The lungs are clear bilaterally.  Postsurgical changes are again noted in the left axilla.  No pneumothorax or sizable effusion is seen.  No bony abnormality is noted.  IMPRESSION: No acute abnormality noted.   Original Report Authenticated By: Alcide Clever, M.D.    Mm Digital Screening  04/18/2012  *RADIOLOGY REPORT*  Clinical Data: Screening.  DIGITAL BILATERAL SCREENING MAMMOGRAM WITH CAD  Comparison:  Previous exams.  FINDINGS:  ACR Breast Density Category 2: There is a scattered fibroglandular pattern.  No suspicious masses, architectural distortion, or calcifications are present.  Images were processed with CAD.  IMPRESSION: No mammographic evidence of malignancy.  A result letter of this screening mammogram will be mailed directly to the patient.  RECOMMENDATION: Screening mammogram in one year. (Code:SM-B-01Y)  BI-RADS CATEGORY 2:  Benign finding(s).   Original Report Authenticated By: Sherian Rein, M.D.     ASSESSMENT: This is a very pleasant 79 years old white female with history of stage I invasive  left breast carcinoma with DCIS diagnosed in 1998 and has been observation since January of 2009 with no evidence for disease recurrence.  PLAN: I discussed the lab and imaging results with the patient. I recommended for her to continue on observation for now with repeat CBC, comprehensive metabolic panel, CA 27.29 as well as chest x-ray in one year. The patient will also continue with her annual mammogram as scheduled.  All questions were answered. The patient knows to call the clinic with any problems, questions or concerns. We can certainly see the patient much sooner if necessary.

## 2012-04-28 NOTE — Patient Instructions (Signed)
No evidence for disease recurrence based on the recent lab and imaging results. Followup in 1 year.

## 2012-04-28 NOTE — Telephone Encounter (Signed)
appts made and printed for pt pt aware to get chest xray day of lab     Alexandra Luna

## 2012-09-01 ENCOUNTER — Other Ambulatory Visit: Payer: Self-pay | Admitting: *Deleted

## 2012-09-01 DIAGNOSIS — Z853 Personal history of malignant neoplasm of breast: Secondary | ICD-10-CM

## 2012-09-01 MED ORDER — VALSARTAN 40 MG PO TABS: 40.0000 mg | ORAL_TABLET | Freq: Every day | ORAL | Status: DC

## 2012-09-21 ENCOUNTER — Encounter: Payer: Self-pay | Admitting: Pharmacist Clinician (PhC)/ Clinical Pharmacy Specialist

## 2012-09-22 ENCOUNTER — Ambulatory Visit (INDEPENDENT_AMBULATORY_CARE_PROVIDER_SITE_OTHER): Payer: Medicare Other | Admitting: Cardiovascular Disease

## 2012-09-22 ENCOUNTER — Encounter: Payer: Self-pay | Admitting: Cardiovascular Disease

## 2012-09-22 VITALS — BP 130/80 | HR 70 | Ht 66.0 in | Wt 175.4 lb

## 2012-09-22 DIAGNOSIS — E785 Hyperlipidemia, unspecified: Secondary | ICD-10-CM

## 2012-09-22 DIAGNOSIS — Z853 Personal history of malignant neoplasm of breast: Secondary | ICD-10-CM

## 2012-09-22 DIAGNOSIS — I10 Essential (primary) hypertension: Secondary | ICD-10-CM

## 2012-09-22 DIAGNOSIS — I4891 Unspecified atrial fibrillation: Secondary | ICD-10-CM

## 2012-09-22 DIAGNOSIS — E782 Mixed hyperlipidemia: Secondary | ICD-10-CM

## 2012-09-22 MED ORDER — APIXABAN 5 MG PO TABS: ORAL_TABLET | ORAL | Status: DC

## 2012-09-22 MED ORDER — HYDROCHLOROTHIAZIDE 25 MG PO TABS: 25.0000 mg | ORAL_TABLET | Freq: Every day | ORAL | Status: DC

## 2012-09-22 MED ORDER — ATENOLOL 50 MG PO TABS: ORAL_TABLET | ORAL | Status: DC

## 2012-09-22 MED ORDER — APIXABAN 2.5 MG PO TABS: 2.5000 mg | ORAL_TABLET | Freq: Two times a day (BID) | ORAL | Status: DC

## 2012-09-22 NOTE — Progress Notes (Signed)
Patient ID: Alexandra Luna, female   DOB: March 01, 1927, 79 y.o.   MRN: [id number]     HPI: Alexandra Luna, is a 79 y.o. female presents to the office today today for cardiology evaluation. I last saw her in December 2013  Alexandra Luna is a history of palpitations, hypertension, as well as mixed hyperlipidemia. Her palpitations have been well controlled with atenolol. Her husband passed away in Jan 16, 2012. Over the past 6 months, she feels that she has been stable from a cardiac standpoint. She takes her blood pressure at home and she states that these readings typically have been within normal limits. She does admit to some issues with sinus problems she denies chest pressure. She denies wheezing. In the past she does some issues with anxiety of her husband's death but this didn't has improved.  Past Medical History  Diagnosis Date  . Breast cancer   . Renal hypertension 03/07/2005    no evidence of significant diameter reduction, dissection, tortuosity, FMD or orther vascular abnormality; kidneys equal in size, symmetry, with normal cortex and medulla  . Palpitations   . Mixed hyperlipidemia   . Anxiety     Past Surgical History  Procedure Laterality Date  . Breast lumpectomy    . Tonsillectomy    . Cesarean section      x 3  . Doppler echocardiography  08/29/2011    EF >55%;  LV systolic fcn normal; doppler flow pattern suggestive of impaired LV relaxation; LA mildly dilated; aortic valve mildly sclerotic, no aortic stenosis  . Cardiovascular stress test  526/2011    R/Dipyridamole - EF 71%; normal myocaridal perfusion w/o evidence of ischemia or infarct; no schintigraphic evidence of inducible myocaridal ischemia; global LV function normal; EKG negative for ischemia; low risk scan     Allergies  Allergen Reactions  . Adhesive (Tape)     Adhesive bandaids  . Lipitor (Atorvastatin)   . Lovaza (Omega-3-Acid Ethyl Esters)   . Other     Z-Pack  . Penicillins   . Tarka  (Trandolapril-Verapamil Hcl Er)     Current Outpatient Prescriptions  Medication Sig Dispense Refill  . acetaminophen (TYLENOL) 500 MG tablet Take 500 mg by mouth every 6 (six) hours as needed.        . ALPRAZolam (XANAX) 0.25 MG tablet Take 0.25 mg by mouth at bedtime as needed.        Marland Kitchen aspirin 81 MG chewable tablet Chew 81 mg by mouth daily.      . calcium carbonate (OS-CAL) 600 MG TABS Take 600 mg by mouth 2 (two) times daily with a meal.        . fish oil-omega-3 fatty acids 1000 MG capsule Take 2,400 mg by mouth daily.       Marland Kitchen loratadine (CLARITIN) 10 MG tablet Take 10 mg by mouth daily.        . Multiple Vitamins-Minerals (ICAPS MV) TABS Take 1 tablet by mouth daily.      . rosuvastatin (CRESTOR) 10 MG tablet Take 10 mg by mouth daily.        . valsartan (DIOVAN) 40 MG tablet Take 1 tablet (40 mg total) by mouth daily.  30 tablet  6  . verapamil (COVERA HS) 240 MG (CO) 24 hr tablet Take 240 mg by mouth daily.        . fluticasone (FLONASE) 50 MCG/ACT nasal spray       . hydrochlorothiazide (HYDRODIURIL) 25 MG tablet Take 1 tablet (25 mg  total) by mouth daily.  90 tablet  3   No current facility-administered medications for this visit.    Socially she is widowed. 3 children 5 grandchildren. She has not been actively exercising. There is no tobacco or alcohol abuse.  ROS is negative for fever chills or night sweats. She does wear glasses. She is unaware of her heart rhythm becoming irregular. She denies shortness of breath. She denies chest pressure. She denies PND orthopnea. There is no indigestion, nausea vomiting. She denies melena. She is unaware of significant leg swelling. She denies paresthesias. Other system review is negative  PE BP 130/80  Pulse 70  Ht 5\' 6"  (1.676 m)  Wt 175 lb 6.4 oz (79.561 kg)  BMI 28.32 kg/m2  General: Alert, oriented, no distress.  Skin: normal turgor, no rashes HEENT: Normocephalic, atraumatic. Pupils round and reactive; sclera anicteric;no lid  lag,  Nose without nasal septal hypertrophy Mouth/Parynx benign; Mallinpatti scale 2/3 Neck: No JVD, no carotid briuts Lungs: clear to ausculatation and percussion; no wheezing or rales Heart:  Irregularly irregular rhythm, s1 s2 normal 1/6 systolic murmur. Abdomen: soft, nontender; no hepatosplenomehaly, BS+; abdominal aorta nontender and not dilated by palpation. Pulses 2+ Extremities: no clubbing cyanosis or edema, Homan's sign negative  Neurologic: grossly nonfocal  ECG: Reveals atrial fibrillation/flutter with coarse fibrillatory waves and a ventricular rate in the 70s. QTc interval 440 ms  LABS:  BMET    Component Value Date/Time   NA 141 04/21/2012 0925   NA 137 03/16/2011 1355   K 3.6 04/21/2012 0925   K 4.1 03/16/2011 1355   CL 102 04/21/2012 0925   CL 99 03/16/2011 1355   CO2 34* 04/21/2012 0925   CO2 28 03/16/2011 1355   GLUCOSE 125* 04/21/2012 0925   GLUCOSE 100* 03/16/2011 1355   BUN 18.0 04/21/2012 0925   BUN 20 03/16/2011 1355   CREATININE 0.8 04/21/2012 0925   CREATININE 0.76 03/16/2011 1355   CALCIUM 10.1 04/21/2012 0925   CALCIUM 10.1 03/16/2011 1355     Hepatic Function Panel     Component Value Date/Time   PROT 6.1* 04/21/2012 0925   PROT 6.2 03/16/2011 1355   ALBUMIN 3.1* 04/21/2012 0925   ALBUMIN 4.0 03/16/2011 1355   AST 19 04/21/2012 0925   AST 26 03/16/2011 1355   ALT 12 04/21/2012 0925   ALT 16 03/16/2011 1355   ALKPHOS 59 04/21/2012 0925   ALKPHOS 55 03/16/2011 1355   BILITOT 0.50 04/21/2012 0925   BILITOT 0.4 03/16/2011 1355     CBC    Component Value Date/Time   WBC 4.7 04/21/2012 1527   RBC 4.04 04/21/2012 1527   HGB 12.6 04/21/2012 1527   HCT 38.3 04/21/2012 1527   PLT 154 04/21/2012 1527   MCV 94.8 04/21/2012 1527   MCH 31.2 04/21/2012 1527   MCHC 32.9 04/21/2012 1527   RDW 13.7 04/21/2012 1527   LYMPHSABS 1.6 04/21/2012 1527   MONOABS 0.7 04/21/2012 1527   EOSABS 0.1 04/21/2012 1527   BASOSABS 0.0 04/21/2012 1527     BNP No results  found for this basename: probnp    Lipid Panel  No results found for this basename: chol, trig, hdl, cholhdl, vldl, ldlcalc     RADIOLOGY: No results found.    ASSESSMENT AND PLAN: Alexandra Luna these is in atrial fibrillation of questionable duration. When I saw her last in December she was in sinus rhythm. I am now further titrating her atenolol to 75 mg twice a day. I  had ong discussion with her regarding anticoagulation therapy. She is greater than 80 but she at times she does walk with a cane for support. I elected to start her on eloquence but at low dose 2.5 twice a day rather than 5 twice a day regimen. I also did have Baxter Hire speak with her today profoundly perspective I will schedule her for a 2-D echo Doppler study as well as a complete set of laboratory including thyroid and magnesium level in addition to electrolytes,CBC, lipid studies in one week I will see her back in the office in 3-4 weeks for followup evaluation after one month of anticoagulation, if she is still in atrial fibrillation we will make plans for elective cardioversion for restoration of sinus rhythm.     Lennette Bihari, MD, [facility]  09/22/2012 12:10 PM

## 2012-09-22 NOTE — Patient Instructions (Signed)
Your physician recommends that you schedule a follow-up appointment in: 4 weeks  Your physician has recommended you make the following change in your medication: start Eliquis twice daily. This prescription has already been sent to your pharmacy.  Sample of the Eliquis 5 mg samples has been provided. Take 1/2 tablet twice daily.  Your physician has requested that you have an echocardiogram. Echocardiography is a painless test that uses sound waves to create images of your heart. It provides your doctor with information about the size and shape of your heart and how well your heart's chambers and valves are working. This procedure takes approximately one hour. There are no restrictions for this procedure.

## 2012-10-02 ENCOUNTER — Ambulatory Visit (HOSPITAL_COMMUNITY)
Admission: RE | Admit: 2012-10-02 | Discharge: 2012-10-02 | Disposition: A | Discharge status: Home / Self Care | Payer: Medicare Other | Source: Ambulatory Visit | Attending: Cardiovascular Disease | Admitting: Cardiovascular Disease

## 2012-10-02 DIAGNOSIS — E785 Hyperlipidemia, unspecified: Secondary | ICD-10-CM | POA: Insufficient documentation

## 2012-10-02 DIAGNOSIS — I079 Rheumatic tricuspid valve disease, unspecified: Secondary | ICD-10-CM | POA: Insufficient documentation

## 2012-10-02 DIAGNOSIS — Z853 Personal history of malignant neoplasm of breast: Secondary | ICD-10-CM | POA: Insufficient documentation

## 2012-10-02 DIAGNOSIS — I10 Essential (primary) hypertension: Secondary | ICD-10-CM | POA: Insufficient documentation

## 2012-10-02 DIAGNOSIS — I379 Nonrheumatic pulmonary valve disorder, unspecified: Secondary | ICD-10-CM | POA: Insufficient documentation

## 2012-10-02 DIAGNOSIS — R002 Palpitations: Secondary | ICD-10-CM | POA: Insufficient documentation

## 2012-10-02 DIAGNOSIS — I359 Nonrheumatic aortic valve disorder, unspecified: Secondary | ICD-10-CM | POA: Insufficient documentation

## 2012-10-02 DIAGNOSIS — I059 Rheumatic mitral valve disease, unspecified: Secondary | ICD-10-CM | POA: Insufficient documentation

## 2012-10-02 DIAGNOSIS — I517 Cardiomegaly: Secondary | ICD-10-CM | POA: Insufficient documentation

## 2012-10-02 DIAGNOSIS — I4891 Unspecified atrial fibrillation: Secondary | ICD-10-CM

## 2012-10-02 NOTE — Progress Notes (Signed)
[facility]   2D echo completed 10/02/2012.   Cindy Alvin Rubano, RDCS  

## 2012-10-08 LAB — COMPREHENSIVE METABOLIC PANEL
ALT: 13 U/L (ref 0–35)
AST: 18 U/L (ref 0–37)
Albumin: 4.1 g/dL (ref 3.5–5.2)
Alkaline Phosphatase: 54 U/L (ref 39–117)
Calcium: 9.9 mg/dL (ref 8.4–10.5)
Chloride: 102 mEq/L (ref 96–112)
Potassium: 3.6 mEq/L (ref 3.5–5.3)
Sodium: 142 mEq/L (ref 135–145)

## 2012-10-08 LAB — CBC
MCH: 30.3 pg (ref 26.0–34.0)
MCHC: 33.9 g/dL (ref 30.0–36.0)
RDW: 14.9 % (ref 11.5–15.5)

## 2012-10-08 LAB — LIPID PANEL
Cholesterol: 118 mg/dL (ref 0–200)
VLDL: 25 mg/dL (ref 0–40)

## 2012-10-08 LAB — MAGNESIUM: Magnesium: 2 mg/dL (ref 1.5–2.5)

## 2012-10-08 LAB — TSH: TSH: 2.451 u[IU]/mL (ref 0.350–4.500)

## 2012-10-10 ENCOUNTER — Encounter: Payer: Self-pay | Admitting: Cardiovascular Disease

## 2012-10-13 ENCOUNTER — Encounter: Payer: Self-pay | Admitting: Cardiovascular Disease

## 2012-10-13 ENCOUNTER — Ambulatory Visit (INDEPENDENT_AMBULATORY_CARE_PROVIDER_SITE_OTHER): Payer: Medicare Other | Admitting: Cardiovascular Disease

## 2012-10-13 VITALS — BP 130/68 | HR 62 | Ht 66.0 in | Wt 177.7 lb

## 2012-10-13 DIAGNOSIS — I4891 Unspecified atrial fibrillation: Secondary | ICD-10-CM

## 2012-10-13 DIAGNOSIS — I10 Essential (primary) hypertension: Secondary | ICD-10-CM

## 2012-10-13 DIAGNOSIS — E782 Mixed hyperlipidemia: Secondary | ICD-10-CM

## 2012-10-13 DIAGNOSIS — Z853 Personal history of malignant neoplasm of breast: Secondary | ICD-10-CM

## 2012-10-13 MED ORDER — PROPAFENONE HCL 150 MG PO TABS: 150.0000 mg | ORAL_TABLET | Freq: Three times a day (TID) | ORAL | Status: DC

## 2012-10-13 NOTE — Patient Instructions (Signed)
Your physician has recommended you make the following change in your medication: START NEW PRESCRIPTION GIVEN FOR PROPAFENONE. This has already been sent to your pharmacy. Decrease your atenolol to 50 mg twice daily. If your pulse gets  ( and stays) below 55 then decrease the atenolol to 25 mg twice daily.  Your physician recommends that you schedule a follow-up appointment in: 2-3 WEEKS.

## 2012-10-13 NOTE — Progress Notes (Signed)
Patient ID: Alexandra Luna, female   DOB: 1926-04-27, 79 y.o.   MRN: [id number]     HPI: Alexandra Luna, is a 79 y.o. female presents to the office today for cardiology followup evaluation. I last saw her on 09/22/2012 at which time she was found to be in atrial fibrillation of questionable duration. When I saw her previously in December 2013, she was in sinus rhythm. Her last office visit, further titrated her atenolol to 75 mg twice a day and started her on anticoagulation therapy with Eliquis 2.5 mg bid. She did undergo a 2-D echo Doppler study which was done on 10/02/2012. This showed vigorous systolic function with an ejection fraction of 65-70%. Abnormal tissue Doppler suggesting markedly elevated LV filling pressures. She had mildly thickened mildly calcified mitral valve leaflet with calcified nodule the tip of the anterior leaflet and moderate posteriorly directed mitral regurgitation. Her LA was moderately dilated with a volume of 36.5 mL's per meter squared. Her RV was moderatey dilated. Right atrium was mildly dilated. Over the past month, Alexandra Luna has felt well. She is unaware that her rhythm is irregular. She denies shortness of breath. She denies chest pain. She denies tachycardia palpitations. She denies bleeding. She denies significant edema. She is unable to be very active.  Past Medical History  Diagnosis Date  . Breast cancer   . Renal hypertension 03/07/2005    no evidence of significant diameter reduction, dissection, tortuosity, FMD or orther vascular abnormality; kidneys equal in size, symmetry, with normal cortex and medulla  . Palpitations   . Mixed hyperlipidemia   . Anxiety     Past Surgical History  Procedure Laterality Date  . Breast lumpectomy    . Tonsillectomy    . Cesarean section      x 3  . Doppler echocardiography  08/29/2011    EF >55%;  LV systolic fcn normal; doppler flow pattern suggestive of impaired LV relaxation; LA mildly dilated; aortic valve mildly  sclerotic, no aortic stenosis  . Cardiovascular stress test  526/2011    R/Dipyridamole - EF 71%; normal myocaridal perfusion w/o evidence of ischemia or infarct; no schintigraphic evidence of inducible myocaridal ischemia; global LV function normal; EKG negative for ischemia; low risk scan     Allergies  Allergen Reactions  . Adhesive (Tape)     Adhesive bandaids  . Lipitor (Atorvastatin)   . Lovaza (Omega-3-Acid Ethyl Esters)   . Other     Z-Pack  . Penicillins   . Tarka (Trandolapril-Verapamil Hcl Er)     Current Outpatient Prescriptions  Medication Sig Dispense Refill  . acetaminophen (TYLENOL) 500 MG tablet Take 500 mg by mouth every 6 (six) hours as needed.        . ALPRAZolam (XANAX) 0.25 MG tablet Take 0.25 mg by mouth at bedtime as needed.        Marland Kitchen apixaban (ELIQUIS) 2.5 MG TABS tablet Take 1 tablet (2.5 mg total) by mouth 2 (two) times daily.  60 tablet  6  . apixaban (ELIQUIS) 5 MG TABS tablet Take 1/2 tablet twicw daily  42 tablet  0  . aspirin 81 MG chewable tablet Chew 81 mg by mouth daily.      Marland Kitchen atenolol (TENORMIN) 50 MG tablet Take 1 & 1/2 tablet twice daily  90 tablet  3  . calcium carbonate (OS-CAL) 600 MG TABS Take 600 mg by mouth 2 (two) times daily with a meal.        . desonide (DESOWEN)  0.05 % ointment       . fish oil-omega-3 fatty acids 1000 MG capsule Take 2,400 mg by mouth daily.       . fluticasone (FLONASE) 50 MCG/ACT nasal spray       . hydrochlorothiazide (HYDRODIURIL) 25 MG tablet Take 1 tablet (25 mg total) by mouth daily.  90 tablet  3  . loratadine (CLARITIN) 10 MG tablet Take 10 mg by mouth daily.        . Multiple Vitamins-Minerals (ICAPS MV) TABS Take 1 tablet by mouth daily.      . rosuvastatin (CRESTOR) 10 MG tablet Take 10 mg by mouth daily.        . valsartan (DIOVAN) 40 MG tablet Take 1 tablet (40 mg total) by mouth daily.  30 tablet  6  . verapamil (COVERA HS) 240 MG (CO) 24 hr tablet Take 240 mg by mouth daily.        . propafenone  (RYTHMOL) 150 MG tablet Take 1 tablet (150 mg total) by mouth every 8 (eight) hours.  60 tablet  6   No current facility-administered medications for this visit.    Socially she is widowed she's here in the office today with her daughter as no tobacco alcohol use. She has 3 children 5 grandchildren.  ROS is negative for fevers, chills or night sweats. She denies awareness of tachycardia palpitations. She denies PND or orthopnea. She doesn't some mild shortness of breath with activity but is not active. She denies chest pressure. She denies bleeding. She denies nausea vomiting or diarrhea. She denies significant edema.   Other system review is negative.  PE BP 130/68  Pulse 62  Ht 5\' 6"  (1.676 m)  Wt 177 lb 11.2 oz (80.604 kg)  BMI 28.7 kg/m2  General: Alert, oriented, no distress.  Skin: normal turgor, no rashes HEENT: Normocephalic, atraumatic. Pupils round and reactive; sclera anicteric;no lid lag.  Nose without nasal septal hypertrophy Mouth/Parynx benign; Mallinpatti scale 2/3 Neck: No JVD, no carotid briuts Lungs: clear to ausculatation and percussion; no wheezing or rales Heart: Irregularly irregular rhythm with controlled ventricular rate in the 60s, s1 s2 normal  Abdomen: soft, nontender; no hepatosplenomehaly, BS+; abdominal aorta nontender and not dilated by palpation. Pulses 2+ Extremities: no clubbing cyanosis or edema, Homan's sign negative  Neurologic: grossly nonfocal  ECG: Atrial fibrillation at 62 beats per minute. The non-diagnostic STT abnormalities  LABS:  BMET    Component Value Date/Time   NA 142 10/08/2012 0910   NA 141 04/21/2012 0925   K 3.6 10/08/2012 0910   K 3.6 04/21/2012 0925   CL 102 10/08/2012 0910   CL 102 04/21/2012 0925   CO2 31 10/08/2012 0910   CO2 34* 04/21/2012 0925   GLUCOSE 107* 10/08/2012 0910   GLUCOSE 125* 04/21/2012 0925   BUN 15 10/08/2012 0910   BUN 18.0 04/21/2012 0925   CREATININE 0.71 10/08/2012 0910   CREATININE 0.8 04/21/2012 0925    CREATININE 0.76 03/16/2011 1355   CALCIUM 9.9 10/08/2012 0910   CALCIUM 10.1 04/21/2012 0925     Hepatic Function Panel     Component Value Date/Time   PROT 6.6 10/08/2012 0910   PROT 6.1* 04/21/2012 0925   ALBUMIN 4.1 10/08/2012 0910   ALBUMIN 3.1* 04/21/2012 0925   AST 18 10/08/2012 0910   AST 19 04/21/2012 0925   ALT 13 10/08/2012 0910   ALT 12 04/21/2012 0925   ALKPHOS 54 10/08/2012 0910   ALKPHOS 59 04/21/2012 0925  BILITOT 0.4 10/08/2012 0910   BILITOT 0.50 04/21/2012 0925     CBC    Component Value Date/Time   WBC 4.2 10/08/2012 0910   WBC 4.7 04/21/2012 1527   RBC 4.35 10/08/2012 0910   RBC 4.04 04/21/2012 1527   HGB 13.2 10/08/2012 0910   HGB 12.6 04/21/2012 1527   HCT 38.9 10/08/2012 0910   HCT 38.3 04/21/2012 1527   PLT 158 10/08/2012 0910   PLT 154 04/21/2012 1527   MCV 89.4 10/08/2012 0910   MCV 94.8 04/21/2012 1527   MCH 30.3 10/08/2012 0910   MCH 31.2 04/21/2012 1527   MCHC 33.9 10/08/2012 0910   MCHC 32.9 04/21/2012 1527   RDW 14.9 10/08/2012 0910   RDW 13.7 04/21/2012 1527   LYMPHSABS 1.6 04/21/2012 1527   MONOABS 0.7 04/21/2012 1527   EOSABS 0.1 04/21/2012 1527   BASOSABS 0.0 04/21/2012 1527     BNP No results found for this basename: probnp    Lipid Panel     Component Value Date/Time   CHOL 118 10/08/2012 0910   TRIG 124 10/08/2012 0910   HDL 50 10/08/2012 0910   CHOLHDL 2.4 10/08/2012 0910   VLDL 25 10/08/2012 0910   LDLCALC 43 10/08/2012 0910     RADIOLOGY: No results found.    ASSESSMENT AND PLAN: Alexandra Luna is remains in atrial fibrillation which is now persistent per high a long discussion with both she and her daughter today. She is now anticoagulation with Eliquis for approximately 3 weeks. I discussed different scenarios with her consisting of 10 at cardioverting her on her current regimen, and initiation of anti-arrhythmic therapy but this may pharmacologically convert her if not then performed cardioversion versus continuing with rate control and permanent atrial fibrillation.  After much discussion, she wishes to initiate a trial of antiarrhythmic therapy. His normal LV function. I will start her on Propofenone 150 mg every 8 hours and will decrease her atenolol back to 50 mg twice a day. However, if her resting pulse gets below 60 she will reduce this to 25 mg twice a day. I'll see her back in the office in several weeks for followup evaluation. If she still is in atrial fibrillation on propofenone  we will then make plans to attempt DC cardioversion.     Lennette Bihari, MD, [facility]  10/13/2012 10:26 PM

## 2012-10-20 ENCOUNTER — Encounter (HOSPITAL_COMMUNITY): Payer: Self-pay | Admitting: *Deleted

## 2012-10-20 DIAGNOSIS — Z87891 Personal history of nicotine dependence: Secondary | ICD-10-CM | POA: Insufficient documentation

## 2012-10-20 DIAGNOSIS — F411 Generalized anxiety disorder: Secondary | ICD-10-CM | POA: Insufficient documentation

## 2012-10-20 DIAGNOSIS — Z880 Allergy status to penicillin: Secondary | ICD-10-CM | POA: Insufficient documentation

## 2012-10-20 DIAGNOSIS — Z79899 Other long term (current) drug therapy: Secondary | ICD-10-CM | POA: Insufficient documentation

## 2012-10-20 DIAGNOSIS — T46901A Poisoning by unspecified agents primarily affecting the cardiovascular system, accidental (unintentional), initial encounter: Secondary | ICD-10-CM | POA: Insufficient documentation

## 2012-10-20 DIAGNOSIS — Y92009 Unspecified place in unspecified non-institutional (private) residence as the place of occurrence of the external cause: Secondary | ICD-10-CM | POA: Insufficient documentation

## 2012-10-20 DIAGNOSIS — Z853 Personal history of malignant neoplasm of breast: Secondary | ICD-10-CM | POA: Insufficient documentation

## 2012-10-20 DIAGNOSIS — T46904A Poisoning by unspecified agents primarily affecting the cardiovascular system, undetermined, initial encounter: Secondary | ICD-10-CM | POA: Insufficient documentation

## 2012-10-20 DIAGNOSIS — E782 Mixed hyperlipidemia: Secondary | ICD-10-CM | POA: Insufficient documentation

## 2012-10-20 DIAGNOSIS — I129 Hypertensive chronic kidney disease with stage 1 through stage 4 chronic kidney disease, or unspecified chronic kidney disease: Secondary | ICD-10-CM | POA: Insufficient documentation

## 2012-10-20 DIAGNOSIS — Y939 Activity, unspecified: Secondary | ICD-10-CM | POA: Insufficient documentation

## 2012-10-20 DIAGNOSIS — I4891 Unspecified atrial fibrillation: Secondary | ICD-10-CM | POA: Insufficient documentation

## 2012-10-20 DIAGNOSIS — Z888 Allergy status to other drugs, medicaments and biological substances status: Secondary | ICD-10-CM | POA: Insufficient documentation

## 2012-10-20 NOTE — ED Notes (Addendum)
Pt states that she normally takes 2 tylenol at night and she was in the dark and took 2 propafenone 150mg  instead of the tyenlol by mistake. Pt states that she is worried about taking the medication because she was suppose to start taking the medication tomorrow. Pt states that she took the medication at 20:30. Pt called pioson control and they responsed by stating to watch for hypotension and bradycardia. Pt alert and oriented currently and deneis pain.

## 2012-10-20 NOTE — ED Notes (Signed)
Nurse First Rounds : Pt. Ambulated to restroom , respirations unlabored , denies pain , nurse explained delay , process and wait time .

## 2012-10-21 ENCOUNTER — Encounter (HOSPITAL_COMMUNITY): Payer: Self-pay | Admitting: Emergency Medicine

## 2012-10-21 ENCOUNTER — Emergency Department (HOSPITAL_COMMUNITY)
Admission: EM | Admit: 2012-10-21 | Discharge: 2012-10-21 | Disposition: A | Discharge status: Home / Self Care | Payer: Medicare Other | Attending: Emergency Medicine | Admitting: Emergency Medicine

## 2012-10-21 DIAGNOSIS — T50901A Poisoning by unspecified drugs, medicaments and biological substances, accidental (unintentional), initial encounter: Secondary | ICD-10-CM

## 2012-10-21 HISTORY — DX: Unspecified atrial fibrillation: I48.91

## 2012-10-21 NOTE — ED Provider Notes (Signed)
History    CSN: [id number] Arrival date & time 10/20/12  2141  First MD Initiated Contact with Patient 10/21/12 0205     Chief Complaint  Patient presents with  . Took Wrong Medication    (Consider location/radiation/quality/duration/timing/severity/associated sxs/prior Treatment) HPI This is an 79 year old female who is supposed to start taking Rythmol today. The power went out at her house yesterday evening and she accidentally took too Rythmol tablets at 8:45 PM mistaking them for Tylenol. She became concerned and called poison control who advised that she be evaluated in the ED. She has been completely asymptomatic. She has had no palpitations, chest pain or shortness of breath. Severity is thus minimal and there are no specific exacerbating or mitigating factors.  Past Medical History  Diagnosis Date  . Breast cancer   . Renal hypertension 03/07/2005    no evidence of significant diameter reduction, dissection, tortuosity, FMD or orther vascular abnormality; kidneys equal in size, symmetry, with normal cortex and medulla  . Palpitations   . Mixed hyperlipidemia   . Anxiety    Past Surgical History  Procedure Laterality Date  . Breast lumpectomy    . Tonsillectomy    . Cesarean section      x 3  . Doppler echocardiography  08/29/2011    EF >55%;  LV systolic fcn normal; doppler flow pattern suggestive of impaired LV relaxation; LA mildly dilated; aortic valve mildly sclerotic, no aortic stenosis  . Cardiovascular stress test  526/2011    R/Dipyridamole - EF 71%; normal myocaridal perfusion w/o evidence of ischemia or infarct; no schintigraphic evidence of inducible myocaridal ischemia; global LV function normal; EKG negative for ischemia; low risk scan    Family History  Problem Relation Age of Onset  . Heart failure Mother   . Breast cancer Mother   . Hypertension Father   . Kidney cancer Father   . Stroke Maternal Grandmother    History  Substance Use Topics  .  Smoking status: Former Smoker    Types: Cigarettes  . Smokeless tobacco: Not on file     Comment: patient quit smoking around 1994  . Alcohol Use: No     Comment: 1/4 glass of wine every 2 weeks   OB History   Grav Para Term Preterm Abortions TAB SAB Ect Mult Living                 Review of Systems  All other systems reviewed and are negative.    Allergies  Adhesive; Lipitor; Lovaza; Other; Penicillins; and Tarka  Home Medications   Current Outpatient Rx  Name  Route  Sig  Dispense  Refill  . acetaminophen (TYLENOL) 500 MG tablet   Oral   Take 1,000 mg by mouth at bedtime.          . ALPRAZolam (XANAX) 0.25 MG tablet   Oral   Take 0.25 mg by mouth at bedtime as needed for sleep.          Marland Kitchen apixaban (ELIQUIS) 2.5 MG TABS tablet   Oral   Take 1 tablet (2.5 mg total) by mouth 2 (two) times daily.   60 tablet   6   . atenolol (TENORMIN) 50 MG tablet   Oral   Take 50 mg by mouth 2 (two) times daily.         . calcium carbonate (OS-CAL) 600 MG TABS   Oral   Take 600 mg by mouth 2 (two) times daily with a meal.           .  desonide (DESOWEN) 0.05 % ointment   Topical   Apply 1 application topically 2 (two) times daily.          . fish oil-omega-3 fatty acids 1000 MG capsule   Oral   Take 2 g by mouth daily.          . fluticasone (FLONASE) 50 MCG/ACT nasal spray   Nasal   Place 1 spray into the nose daily.          . hydrochlorothiazide (HYDRODIURIL) 25 MG tablet   Oral   Take 1 tablet (25 mg total) by mouth daily.   90 tablet   3   . loratadine (CLARITIN) 10 MG tablet   Oral   Take 10 mg by mouth daily.           . Multiple Vitamins-Minerals (ICAPS MV) TABS   Oral   Take 1 tablet by mouth daily.         . propafenone (RYTHMOL) 150 MG tablet   Oral   Take 1 tablet (150 mg total) by mouth every 8 (eight) hours.   60 tablet   6   . rosuvastatin (CRESTOR) 10 MG tablet   Oral   Take 10 mg by mouth daily.           . valsartan  (DIOVAN) 40 MG tablet   Oral   Take 1 tablet (40 mg total) by mouth daily.   30 tablet   6   . verapamil (COVERA HS) 240 MG (CO) 24 hr tablet   Oral   Take 240 mg by mouth daily.            BP 150/91  Pulse 84  Temp(Src) 98.3 F (36.8 C) (Oral)  Resp 14  SpO2 98%  Physical Exam General: Well-developed, well-nourished female in no acute distress; appearance consistent with age of record HENT: normocephalic, atraumatic Eyes: pupils equal round and reactive to light; extraocular muscles intact Neck: supple Heart: Irregular rhythm Lungs: clear to auscultation bilaterally Abdomen: soft; nondistended; nontender; no masses or hepatosplenomegaly; bowel sounds present Extremities: No deformity; full range of motion; pulses normal; no edema Neurologic: Awake, alert and oriented; motor function intact in all extremities and symmetric; no facial droop Skin: Warm and dry Psychiatric: Normal mood and affect    ED Course  Procedures (including critical care time)    MDM  2:55 AM Patient continues to be asymptomatic  EKG Interpretation:  Date & Time: 10/21/2012 2:20 AM  Rate: 84  Rhythm: atrial fibrillation with PVC  QRS Axis: right  Intervals: normal  ST/T Wave abnormalities: nonspecific T wave changes  Conduction Disutrbances:none  Narrative Interpretation:   Old EKG Reviewed: No significant changes except PVC not seen previously     Hanley Seamen, MD 10/21/12 [phone]

## 2012-10-24 ENCOUNTER — Telehealth: Payer: Self-pay | Admitting: Cardiovascular Disease

## 2012-10-24 NOTE — Telephone Encounter (Signed)
Notified patient  Decrease  Atenolol 25 mg bid ,hold tonight dose  Start tomorrow. Pt voiced understanding

## 2012-10-24 NOTE — Telephone Encounter (Signed)
Pt is concerned about her blood pressure range sbp 90's x 2 today, less than 110 yesterday.  She  Takes Atenolol 50mg  bid.  Can't sleep well for the past few days.Little weak she states.

## 2012-10-24 NOTE — Telephone Encounter (Signed)
Has questions about the instructions  On how to take her medication .Marland Kitchen Please call...  Thanks

## 2012-10-24 NOTE — Telephone Encounter (Signed)
Decrease Atenolol to 25 mg BID.  But hold tonight's dose and begin 25 mg BID in AM.  Call if further problems.

## 2012-11-04 ENCOUNTER — Ambulatory Visit (INDEPENDENT_AMBULATORY_CARE_PROVIDER_SITE_OTHER): Payer: Medicare Other | Admitting: Cardiovascular Disease

## 2012-11-04 ENCOUNTER — Encounter: Payer: Self-pay | Admitting: Cardiovascular Disease

## 2012-11-04 VITALS — BP 118/64 | HR 61 | Ht 66.0 in | Wt 176.5 lb

## 2012-11-04 DIAGNOSIS — I4891 Unspecified atrial fibrillation: Secondary | ICD-10-CM

## 2012-11-04 DIAGNOSIS — Z01818 Encounter for other preprocedural examination: Secondary | ICD-10-CM

## 2012-11-04 DIAGNOSIS — D689 Coagulation defect, unspecified: Secondary | ICD-10-CM

## 2012-11-04 DIAGNOSIS — Z853 Personal history of malignant neoplasm of breast: Secondary | ICD-10-CM

## 2012-11-04 DIAGNOSIS — I10 Essential (primary) hypertension: Secondary | ICD-10-CM

## 2012-11-04 DIAGNOSIS — E782 Mixed hyperlipidemia: Secondary | ICD-10-CM

## 2012-11-04 NOTE — Progress Notes (Signed)
Patient ID: Alexandra Luna, female   DOB: 18-Sep-1926, 79 y.o.   MRN: [id number]     HPI: Alexandra Luna, is a 79 y.o. female presents to the office today for cardiology followup evaluation. When I saw her on 09/22/2012 she was found to be in atrial fibrillation of questionable duration.  In December 2013, she was in sinus rhythm. In June I further titrated her atenolol to 75 mg twice a day and started her on anticoagulation therapy with Eliquis 2.5 mg bid. She did undergo a 2-D echo Doppler study which was done on 10/02/2012. This showed vigorous systolic function with an ejection fraction of 65-70%. Abnormal tissue Doppler suggesting markedly elevated LV filling pressures. She had mildly thickened mildly calcified mitral valve leaflet with calcified nodule the tip of the anterior leaflet and moderate posteriorly directed mitral regurgitation. Her LA was moderately dilated with a volume of 36.5 mL's per meter squared. Her RV was moderatey dilated. Right atrium was mildly dilated. Over the past month, [name] has felt well. She is unaware that her rhythm is irregular. She denies shortness of breath. She denies chest pain. She denies tachycardia palpitations. She denies bleeding.  She was seen last on 10/13/2012 at which time she was still in atrial fibrillation. At that time we had a long discussion concerning attempt at pharmacologic cardioversion with potential need for DC cardioversion versus continued atrial fibrillation. After much discussion with both she and her daughter, I elected to initiate propofenone 225 mg 3 times a day and subsequently reduced her atenolol dose. Her atenolol dose has been further reduced down to 25 mg 3 times a day and she has been maintained on Verapamil 240 mg daily. On his combination therapy, she has felt well. She is unaware of her heart being irregular. She denies significant shortness of breath. She denies chest pressure. She denies bleeding. Past Medical History    Diagnosis Date  . Breast cancer   . Renal hypertension 03/07/2005    no evidence of significant diameter reduction, dissection, tortuosity, FMD or orther vascular abnormality; kidneys equal in size, symmetry, with normal cortex and medulla  . Palpitations   . Mixed hyperlipidemia   . Anxiety   . Atrial fibrillation     Past Surgical History  Procedure Laterality Date  . Breast lumpectomy    . Tonsillectomy    . Cesarean section      x 3  . Doppler echocardiography  08/29/2011    EF >55%;  LV systolic fcn normal; doppler flow pattern suggestive of impaired LV relaxation; LA mildly dilated; aortic valve mildly sclerotic, no aortic stenosis  . Cardiovascular stress test  526/2011    R/Dipyridamole - EF 71%; normal myocaridal perfusion w/o evidence of ischemia or infarct; no schintigraphic evidence of inducible myocaridal ischemia; global LV function normal; EKG negative for ischemia; low risk scan     Allergies  Allergen Reactions  . Adhesive (Tape)     Adhesive bandaids  . Lipitor (Atorvastatin)   . Lovaza (Omega-3-Acid Ethyl Esters)   . Other     Z-Pack  . Penicillins   . Tarka (Trandolapril-Verapamil Hcl Er)     Current Outpatient Prescriptions  Medication Sig Dispense Refill  . acetaminophen (TYLENOL) 500 MG tablet Take 1,000 mg by mouth at bedtime.       . ALPRAZolam (XANAX) 0.25 MG tablet Take 0.25 mg by mouth at bedtime as needed for sleep.       Marland Kitchen apixaban (ELIQUIS) 2.5 MG TABS tablet  Take 1 tablet (2.5 mg total) by mouth 2 (two) times daily.  60 tablet  6  . atenolol (TENORMIN) 50 MG tablet Take 25 mg by mouth 2 (two) times daily.       . calcium carbonate (OS-CAL) 600 MG TABS Take 600 mg by mouth 2 (two) times daily with a meal.        . desonide (DESOWEN) 0.05 % ointment Apply 1 application topically 2 (two) times daily.       . fish oil-omega-3 fatty acids 1000 MG capsule Take 2 g by mouth daily.       . fluticasone (FLONASE) 50 MCG/ACT nasal spray Place 1 spray  into the nose daily.       . hydrochlorothiazide (HYDRODIURIL) 25 MG tablet Take 1 tablet (25 mg total) by mouth daily.  90 tablet  3  . loratadine (CLARITIN) 10 MG tablet Take 10 mg by mouth daily.        . Multiple Vitamins-Minerals (ICAPS MV) TABS Take 1 tablet by mouth daily.      . propafenone (RYTHMOL) 150 MG tablet Take 1 tablet (150 mg total) by mouth every 8 (eight) hours.  60 tablet  6  . rosuvastatin (CRESTOR) 10 MG tablet Take 10 mg by mouth daily.        . valsartan (DIOVAN) 40 MG tablet Take 1 tablet (40 mg total) by mouth daily.  30 tablet  6  . verapamil (COVERA HS) 240 MG (CO) 24 hr tablet Take 240 mg by mouth daily.         No current facility-administered medications for this visit.    Socially she is widowed she's here in the office today with her daughter as no tobacco alcohol use. She has 3 children 5 grandchildren.  ROS is negative for fevers, chills or night sweats. She denies awareness of tachycardia palpitations. She denies PND or orthopnea. She doesn't some mild shortness of breath with activity but is not active. She denies chest pressure. She denies bleeding. She denies nausea vomiting or diarrhea. She denies significant edema.   Other system review is negative.  PE BP 118/64  Pulse 61  Ht 5\' 6"  (1.676 m)  Wt 176 lb 8 oz (80.06 kg)  BMI 28.5 kg/m2  General: Alert, oriented, no distress.  Skin: normal turgor, no rashes HEENT: Normocephalic, atraumatic. Pupils round and reactive; sclera anicteric;no lid lag.  Nose without nasal septal hypertrophy Mouth/Parynx benign; Mallinpatti scale 2/3 Neck: No JVD, no carotid briuts Lungs: clear to ausculatation and percussion; no wheezing or rales Heart: Irregularly irregular rhythm with controlled ventricular rate in the 60s, s1 s2 normal  Abdomen: soft, nontender; no hepatosplenomehaly, BS+; abdominal aorta nontender and not dilated by palpation. Pulses 2+ Extremities: no clubbing cyanosis or edema, Homan's sign  negative  Neurologic: grossly nonfocal  ECG: Atrial fibrillation at 61 beats per minute; non-diagnostic STT abnormalities  LABS:  BMET    Component Value Date/Time   NA 142 10/08/2012 0910   NA 141 04/21/2012 0925   K 3.6 10/08/2012 0910   K 3.6 04/21/2012 0925   CL 102 10/08/2012 0910   CL 102 04/21/2012 0925   CO2 31 10/08/2012 0910   CO2 34* 04/21/2012 0925   GLUCOSE 107* 10/08/2012 0910   GLUCOSE 125* 04/21/2012 0925   BUN 15 10/08/2012 0910   BUN 18.0 04/21/2012 0925   CREATININE 0.71 10/08/2012 0910   CREATININE 0.8 04/21/2012 0925   CREATININE 0.76 03/16/2011 1355   CALCIUM 9.9 10/08/2012  0910   CALCIUM 10.1 04/21/2012 0925     Hepatic Function Panel     Component Value Date/Time   PROT 6.6 10/08/2012 0910   PROT 6.1* 04/21/2012 0925   ALBUMIN 4.1 10/08/2012 0910   ALBUMIN 3.1* 04/21/2012 0925   AST 18 10/08/2012 0910   AST 19 04/21/2012 0925   ALT 13 10/08/2012 0910   ALT 12 04/21/2012 0925   ALKPHOS 54 10/08/2012 0910   ALKPHOS 59 04/21/2012 0925   BILITOT 0.4 10/08/2012 0910   BILITOT 0.50 04/21/2012 0925     CBC    Component Value Date/Time   WBC 4.2 10/08/2012 0910   WBC 4.7 04/21/2012 1527   RBC 4.35 10/08/2012 0910   RBC 4.04 04/21/2012 1527   HGB 13.2 10/08/2012 0910   HGB 12.6 04/21/2012 1527   HCT 38.9 10/08/2012 0910   HCT 38.3 04/21/2012 1527   PLT 158 10/08/2012 0910   PLT 154 04/21/2012 1527   MCV 89.4 10/08/2012 0910   MCV 94.8 04/21/2012 1527   MCH 30.3 10/08/2012 0910   MCH 31.2 04/21/2012 1527   MCHC 33.9 10/08/2012 0910   MCHC 32.9 04/21/2012 1527   RDW 14.9 10/08/2012 0910   RDW 13.7 04/21/2012 1527   LYMPHSABS 1.6 04/21/2012 1527   MONOABS 0.7 04/21/2012 1527   EOSABS 0.1 04/21/2012 1527   BASOSABS 0.0 04/21/2012 1527     BNP No results found for this basename: probnp    Lipid Panel     Component Value Date/Time   CHOL 118 10/08/2012 0910   TRIG 124 10/08/2012 0910   HDL 50 10/08/2012 0910   CHOLHDL 2.4 10/08/2012 0910   VLDL 25 10/08/2012 0910   LDLCALC 43 10/08/2012 0910      RADIOLOGY: No results found.    ASSESSMENT AND PLAN: Ms. Edinger  remains in atrial fibrillation which is now persistent over several months duration. I again had a long discussion with both she and her daughter today. She is now anticoagulation with Eliquis for approximately 7 weeks. Her heart rate is well-controlled now on Propulsid 02 and 25 mg every 8 hours, atenolol 25 mg twice a day in addition to her verapamil 240 mg daily. After further discussion, we will make plans for outpatient attempt at DC cardioversion for restoration of sinus rhythm. Hopefully she will be successfully cardioverted and with problems will  have a greater likelihood of maintaining sinus rhythm. If she does develop recurrent AF, I suspect we will then settle for permanent AF and not reattempt cardioversion. We will try to schedule this outpatient cardioversion to be done at [place] next week. Laboratory checked prior to that evaluation.    Lennette Bihari, MD, [facility]  11/04/2012 10:59 AM

## 2012-11-04 NOTE — Patient Instructions (Addendum)
Your physician has recommended that you have a Cardioversion (DCCV). Electrical Cardioversion uses a jolt of electricity to your heart either through paddles or wired patches attached to your chest. This is a controlled, usually prescheduled, procedure. Defibrillation is done under light anesthesia in the hospital, and you usually go home the day of the procedure. This is done to get your heart back into a normal rhythm. You are not awake for the procedure. Please see the instruction sheet given to you today.  Your physician recommends that you return for lab work CMP, CBC, INR, PTT  A chest x-ray takes a picture of the organs and structures inside the chest, including the heart, lungs, and blood vessels. This test can show several things, including, whether the heart is enlarges; whether fluid is building up in the lungs; and whether pacemaker / defibrillator leads are still in place.

## 2012-11-05 ENCOUNTER — Other Ambulatory Visit: Payer: Self-pay | Admitting: *Deleted

## 2012-11-05 ENCOUNTER — Other Ambulatory Visit: Payer: Self-pay

## 2012-11-05 DIAGNOSIS — Z01818 Encounter for other preprocedural examination: Secondary | ICD-10-CM

## 2012-11-06 ENCOUNTER — Ambulatory Visit
Admission: RE | Admit: 2012-11-06 | Discharge: 2012-11-06 | Disposition: A | Discharge status: Home / Self Care | Payer: Medicare Other | Source: Ambulatory Visit | Attending: Cardiovascular Disease | Admitting: Cardiovascular Disease

## 2012-11-06 DIAGNOSIS — Z01818 Encounter for other preprocedural examination: Secondary | ICD-10-CM

## 2012-11-06 LAB — CBC
MCV: 89.7 fL (ref 78.0–100.0)
Platelets: 140 10*3/uL — ABNORMAL LOW (ref 150–400)
RBC: 4.37 MIL/uL (ref 3.87–5.11)
WBC: 5.8 10*3/uL (ref 4.0–10.5)

## 2012-11-06 LAB — PROTIME-INR: INR: 1.11 (ref <1.50)

## 2012-11-06 LAB — COMPREHENSIVE METABOLIC PANEL
CO2: 31 mEq/L (ref 19–32)
Creat: 0.81 mg/dL (ref 0.50–1.10)
Glucose, Bld: 97 mg/dL (ref 70–99)
Sodium: 138 mEq/L (ref 135–145)
Total Bilirubin: 0.4 mg/dL (ref 0.3–1.2)
Total Protein: 6.3 g/dL (ref 6.0–8.3)

## 2012-11-06 LAB — APTT: aPTT: 30 seconds (ref 24–37)

## 2012-11-12 ENCOUNTER — Encounter (HOSPITAL_COMMUNITY): Payer: Self-pay | Admitting: Certified Registered"

## 2012-11-12 ENCOUNTER — Ambulatory Visit (HOSPITAL_COMMUNITY)
Admission: RE | Admit: 2012-11-12 | Discharge: 2012-11-12 | Disposition: A | Discharge status: Home / Self Care | Payer: Medicare Other | Source: Ambulatory Visit | Attending: Cardiovascular Disease | Admitting: Cardiovascular Disease

## 2012-11-12 ENCOUNTER — Encounter (HOSPITAL_COMMUNITY)
Admission: RE | Disposition: A | Discharge status: Home / Self Care | Payer: Self-pay | Source: Ambulatory Visit | Attending: Cardiovascular Disease

## 2012-11-12 ENCOUNTER — Encounter (HOSPITAL_COMMUNITY): Payer: Self-pay | Admitting: Gastroenterology

## 2012-11-12 ENCOUNTER — Ambulatory Visit (HOSPITAL_COMMUNITY): Payer: Medicare Other | Admitting: Certified Registered"

## 2012-11-12 DIAGNOSIS — Z880 Allergy status to penicillin: Secondary | ICD-10-CM | POA: Insufficient documentation

## 2012-11-12 DIAGNOSIS — I129 Hypertensive chronic kidney disease with stage 1 through stage 4 chronic kidney disease, or unspecified chronic kidney disease: Secondary | ICD-10-CM | POA: Insufficient documentation

## 2012-11-12 DIAGNOSIS — Z01818 Encounter for other preprocedural examination: Secondary | ICD-10-CM

## 2012-11-12 DIAGNOSIS — Z79899 Other long term (current) drug therapy: Secondary | ICD-10-CM | POA: Insufficient documentation

## 2012-11-12 DIAGNOSIS — Z9109 Other allergy status, other than to drugs and biological substances: Secondary | ICD-10-CM | POA: Insufficient documentation

## 2012-11-12 DIAGNOSIS — Z7901 Long term (current) use of anticoagulants: Secondary | ICD-10-CM | POA: Insufficient documentation

## 2012-11-12 DIAGNOSIS — Z881 Allergy status to other antibiotic agents status: Secondary | ICD-10-CM | POA: Insufficient documentation

## 2012-11-12 DIAGNOSIS — F411 Generalized anxiety disorder: Secondary | ICD-10-CM | POA: Insufficient documentation

## 2012-11-12 DIAGNOSIS — E782 Mixed hyperlipidemia: Secondary | ICD-10-CM | POA: Insufficient documentation

## 2012-11-12 DIAGNOSIS — N189 Chronic kidney disease, unspecified: Secondary | ICD-10-CM | POA: Insufficient documentation

## 2012-11-12 DIAGNOSIS — Z888 Allergy status to other drugs, medicaments and biological substances status: Secondary | ICD-10-CM | POA: Insufficient documentation

## 2012-11-12 DIAGNOSIS — I4891 Unspecified atrial fibrillation: Secondary | ICD-10-CM

## 2012-11-12 DIAGNOSIS — Z853 Personal history of malignant neoplasm of breast: Secondary | ICD-10-CM | POA: Insufficient documentation

## 2012-11-12 HISTORY — PX: CARDIOVERSION: SHX1299

## 2012-11-12 SURGERY — CARDIOVERSION
Anesthesia: General | Wound class: Clean

## 2012-11-12 MED ORDER — DEXTROSE-NACL 5-0.45 % IV SOLN: INTRAVENOUS | Status: DC

## 2012-11-12 MED ORDER — SODIUM CHLORIDE 0.9 % IV SOLN
INTRAVENOUS | Status: DC
  Administered 2012-11-12: 500 mL via INTRAVENOUS

## 2012-11-12 MED ORDER — SODIUM CHLORIDE 0.9 % IV SOLN
INTRAVENOUS | Status: DC | PRN
  Administered 2012-11-12: 13:00:00 via INTRAVENOUS

## 2012-11-12 MED ORDER — PROPOFOL 10 MG/ML IV BOLUS
INTRAVENOUS | Status: DC | PRN
  Administered 2012-11-12: 100 mg via INTRAVENOUS

## 2012-11-12 NOTE — Transfer of Care (Signed)
Immediate Anesthesia Transfer of Care Note  Patient: Alexandra Luna  Procedure(s) Performed: Procedure(s): CARDIOVERSION (N/A)  Patient Location: PACU and Endoscopy Unit  Anesthesia Type:General  Level of Consciousness: awake, oriented, patient cooperative and responds to stimulation  Airway & Oxygen Therapy: Patient Spontanous Breathing and Patient connected to nasal cannula oxygen  Post-op Assessment: Report given to PACU RN and Post -op Vital signs reviewed and stable  Post vital signs: Reviewed and stable  Complications: No apparent anesthesia complications

## 2012-11-12 NOTE — Anesthesia Preprocedure Evaluation (Signed)
Anesthesia Evaluation  Patient identified by MRN, date of birth, ID band Patient awake    Reviewed: Allergy & Precautions, H&P , NPO status , Patient's Chart, lab work & pertinent test results, reviewed documented beta blocker date and time   Airway Mallampati: II TM Distance: >3 FB Neck ROM: full    Dental   Pulmonary neg pulmonary ROS,  breath sounds clear to auscultation        Cardiovascular hypertension, On Medications and On Home Beta Blockers + dysrhythmias Atrial Fibrillation Rhythm:regular     Neuro/Psych negative neurological ROS  negative psych ROS   GI/Hepatic negative GI ROS, Neg liver ROS,   Endo/Other  negative endocrine ROS  Renal/GU negative Renal ROS  negative genitourinary   Musculoskeletal   Abdominal   Peds  Hematology negative hematology ROS (+)   Anesthesia Other Findings See surgeon's H&P   Reproductive/Obstetrics negative OB ROS                           Anesthesia Physical Anesthesia Plan  ASA: III  Anesthesia Plan: General   Post-op Pain Management:    Induction: Intravenous  Airway Management Planned: Mask  Additional Equipment:   Intra-op Plan:   Post-operative Plan:   Informed Consent: I have reviewed the patients History and Physical, chart, labs and discussed the procedure including the risks, benefits and alternatives for the proposed anesthesia with the patient or authorized representative who has indicated his/her understanding and acceptance.   Dental Advisory Given  Plan Discussed with: CRNA and Surgeon  Anesthesia Plan Comments:         Anesthesia Quick Evaluation

## 2012-11-12 NOTE — H&P (View-Only) (Signed)
Patient ID: Alexandra Luna, female   DOB: 08/26/1926, 79 y.o.   MRN: [id number]     HPI: Alexandra Luna, is a 79 y.o. female presents to the office today for cardiology followup evaluation. When I saw her on 09/22/2012 she was found to be in atrial fibrillation of questionable duration.  In December 2013, she was in sinus rhythm. In June I further titrated her atenolol to 75 mg twice a day and started her on anticoagulation therapy with Eliquis 2.5 mg bid. She did undergo a 2-D echo Doppler study which was done on 10/02/2012. This showed vigorous systolic function with an ejection fraction of 65-70%. Abnormal tissue Doppler suggesting markedly elevated LV filling pressures. She had mildly thickened mildly calcified mitral valve leaflet with calcified nodule the tip of the anterior leaflet and moderate posteriorly directed mitral regurgitation. Her LA was moderately dilated with a volume of 36.5 mL's per meter squared. Her RV was moderatey dilated. Right atrium was mildly dilated. Over the past month, Alexandra Luna has felt well. She is unaware that her rhythm is irregular. She denies shortness of breath. She denies chest pain. She denies tachycardia palpitations. She denies bleeding.  She was seen last on 10/13/2012 at which time she was still in atrial fibrillation. At that time we had a long discussion concerning attempt at pharmacologic cardioversion with potential need for DC cardioversion versus continued atrial fibrillation. After much discussion with both she and her daughter, I elected to initiate propofenone 225 mg 3 times a day and subsequently reduced her atenolol dose. Her atenolol dose has been further reduced down to 25 mg 3 times a day and she has been maintained on Verapamil 240 mg daily. On his combination therapy, she has felt well. She is unaware of her heart being irregular. She denies significant shortness of breath. She denies chest pressure. She denies bleeding. Past Medical History    Diagnosis Date  . Breast cancer   . Renal hypertension 03/07/2005    no evidence of significant diameter reduction, dissection, tortuosity, FMD or orther vascular abnormality; kidneys equal in size, symmetry, with normal cortex and medulla  . Palpitations   . Mixed hyperlipidemia   . Anxiety   . Atrial fibrillation     Past Surgical History  Procedure Laterality Date  . Breast lumpectomy    . Tonsillectomy    . Cesarean section      x 3  . Doppler echocardiography  08/29/2011    EF >55%;  LV systolic fcn normal; doppler flow pattern suggestive of impaired LV relaxation; LA mildly dilated; aortic valve mildly sclerotic, no aortic stenosis  . Cardiovascular stress test  526/2011    R/Dipyridamole - EF 71%; normal myocaridal perfusion w/o evidence of ischemia or infarct; no schintigraphic evidence of inducible myocaridal ischemia; global LV function normal; EKG negative for ischemia; low risk scan     Allergies  Allergen Reactions  . Adhesive (Tape)     Adhesive bandaids  . Lipitor (Atorvastatin)   . Lovaza (Omega-3-Acid Ethyl Esters)   . Other     Z-Pack  . Penicillins   . Tarka (Trandolapril-Verapamil Hcl Er)     Current Outpatient Prescriptions  Medication Sig Dispense Refill  . acetaminophen (TYLENOL) 500 MG tablet Take 1,000 mg by mouth at bedtime.       . ALPRAZolam (XANAX) 0.25 MG tablet Take 0.25 mg by mouth at bedtime as needed for sleep.       . apixaban (ELIQUIS) 2.5 MG TABS tablet   Take 1 tablet (2.5 mg total) by mouth 2 (two) times daily.  60 tablet  6  . atenolol (TENORMIN) 50 MG tablet Take 25 mg by mouth 2 (two) times daily.       . calcium carbonate (OS-CAL) 600 MG TABS Take 600 mg by mouth 2 (two) times daily with a meal.        . desonide (DESOWEN) 0.05 % ointment Apply 1 application topically 2 (two) times daily.       . fish oil-omega-3 fatty acids 1000 MG capsule Take 2 g by mouth daily.       . fluticasone (FLONASE) 50 MCG/ACT nasal spray Place 1 spray  into the nose daily.       . hydrochlorothiazide (HYDRODIURIL) 25 MG tablet Take 1 tablet (25 mg total) by mouth daily.  90 tablet  3  . loratadine (CLARITIN) 10 MG tablet Take 10 mg by mouth daily.        . Multiple Vitamins-Minerals (ICAPS MV) TABS Take 1 tablet by mouth daily.      . propafenone (RYTHMOL) 150 MG tablet Take 1 tablet (150 mg total) by mouth every 8 (eight) hours.  60 tablet  6  . rosuvastatin (CRESTOR) 10 MG tablet Take 10 mg by mouth daily.        . valsartan (DIOVAN) 40 MG tablet Take 1 tablet (40 mg total) by mouth daily.  30 tablet  6  . verapamil (COVERA HS) 240 MG (CO) 24 hr tablet Take 240 mg by mouth daily.         No current facility-administered medications for this visit.    Socially she is widowed she's here in the office today with her daughter as no tobacco alcohol use. She has 3 children 5 grandchildren.  ROS is negative for fevers, chills or night sweats. She denies awareness of tachycardia palpitations. She denies PND or orthopnea. She doesn't some mild shortness of breath with activity but is not active. She denies chest pressure. She denies bleeding. She denies nausea vomiting or diarrhea. She denies significant edema.   Other system review is negative.  PE BP 118/64  Pulse 61  Ht 5' 6" (1.676 m)  Wt 176 lb 8 oz (80.06 kg)  BMI 28.5 kg/m2  General: Alert, oriented, no distress.  Skin: normal turgor, no rashes HEENT: Normocephalic, atraumatic. Pupils round and reactive; sclera anicteric;no lid lag.  Nose without nasal septal hypertrophy Mouth/Parynx benign; Mallinpatti scale 2/3 Neck: No JVD, no carotid briuts Lungs: clear to ausculatation and percussion; no wheezing or rales Heart: Irregularly irregular rhythm with controlled ventricular rate in the 60s, s1 s2 normal  Abdomen: soft, nontender; no hepatosplenomehaly, BS+; abdominal aorta nontender and not dilated by palpation. Pulses 2+ Extremities: no clubbing cyanosis or edema, Homan's sign  negative  Neurologic: grossly nonfocal  ECG: Atrial fibrillation at 61 beats per minute; non-diagnostic STT abnormalities  LABS:  BMET    Component Value Date/Time   NA 142 10/08/2012 0910   NA 141 04/21/2012 0925   K 3.6 10/08/2012 0910   K 3.6 04/21/2012 0925   CL 102 10/08/2012 0910   CL 102 04/21/2012 0925   CO2 31 10/08/2012 0910   CO2 34* 04/21/2012 0925   GLUCOSE 107* 10/08/2012 0910   GLUCOSE 125* 04/21/2012 0925   BUN 15 10/08/2012 0910   BUN 18.0 04/21/2012 0925   CREATININE 0.71 10/08/2012 0910   CREATININE 0.8 04/21/2012 0925   CREATININE 0.76 03/16/2011 1355   CALCIUM 9.9 10/08/2012   0910   CALCIUM 10.1 04/21/2012 0925     Hepatic Function Panel     Component Value Date/Time   PROT 6.6 10/08/2012 0910   PROT 6.1* 04/21/2012 0925   ALBUMIN 4.1 10/08/2012 0910   ALBUMIN 3.1* 04/21/2012 0925   AST 18 10/08/2012 0910   AST 19 04/21/2012 0925   ALT 13 10/08/2012 0910   ALT 12 04/21/2012 0925   ALKPHOS 54 10/08/2012 0910   ALKPHOS 59 04/21/2012 0925   BILITOT 0.4 10/08/2012 0910   BILITOT 0.50 04/21/2012 0925     CBC    Component Value Date/Time   WBC 4.2 10/08/2012 0910   WBC 4.7 04/21/2012 1527   RBC 4.35 10/08/2012 0910   RBC 4.04 04/21/2012 1527   HGB 13.2 10/08/2012 0910   HGB 12.6 04/21/2012 1527   HCT 38.9 10/08/2012 0910   HCT 38.3 04/21/2012 1527   PLT 158 10/08/2012 0910   PLT 154 04/21/2012 1527   MCV 89.4 10/08/2012 0910   MCV 94.8 04/21/2012 1527   MCH 30.3 10/08/2012 0910   MCH 31.2 04/21/2012 1527   MCHC 33.9 10/08/2012 0910   MCHC 32.9 04/21/2012 1527   RDW 14.9 10/08/2012 0910   RDW 13.7 04/21/2012 1527   LYMPHSABS 1.6 04/21/2012 1527   MONOABS 0.7 04/21/2012 1527   EOSABS 0.1 04/21/2012 1527   BASOSABS 0.0 04/21/2012 1527     BNP No results found for this basename: probnp    Lipid Panel     Component Value Date/Time   CHOL 118 10/08/2012 0910   TRIG 124 10/08/2012 0910   HDL 50 10/08/2012 0910   CHOLHDL 2.4 10/08/2012 0910   VLDL 25 10/08/2012 0910   LDLCALC 43 10/08/2012 0910      RADIOLOGY: No results found.    ASSESSMENT AND PLAN: Alexandra Luna  remains in atrial fibrillation which is now persistent over several months duration. I again had a long discussion with both she and her daughter today. She is now anticoagulation with Eliquis for approximately 7 weeks. Her heart rate is well-controlled now on Propulsid 02 and 25 mg every 8 hours, atenolol 25 mg twice a day in addition to her verapamil 240 mg daily. After further discussion, we will make plans for outpatient attempt at DC cardioversion for restoration of sinus rhythm. Hopefully she will be successfully cardioverted and with problems will  have a greater likelihood of maintaining sinus rhythm. If she does develop recurrent AF, I suspect we will then settle for permanent AF and not reattempt cardioversion. We will try to schedule this outpatient cardioversion to be done at [place] next week. Laboratory checked prior to that evaluation.    Nasreen Goedecke A. Jackston [place], MD, FACC  11/04/2012 10:59 AM    

## 2012-11-12 NOTE — CV Procedure (Signed)
[facility]  CARDIOVERSION NOTE   Procedure: Electrical Cardioversion Indications:  Atrial Fibrillation  Procedure Details:  Consent: Risks of procedure as well as the alternatives and risks of each were explained to the (patient/caregiver).  Consent for procedure obtained.  Time Out: Verified patient identification, verified procedure, site/side was marked, verified correct patient position, special equipment/implants available, medications/allergies/relevent history reviewed, required imaging and test results available.    Patient placed on cardiac monitor, pulse oximetry, supplemental Performedoxygen as necessary.  Sedation given: propofol 100 mg Pacer pads placed anterior and posterior chest.  Cardioverted 1 time(s).  Cardioverted at 120J.  Evaluation: Findings: Post procedure EKG shows: NSR Complications: None Patient did tolerate procedure well.    Lennette Bihari, MD, [facility] 11/12/2012 1:18 PM

## 2012-11-12 NOTE — Anesthesia Postprocedure Evaluation (Signed)
  Anesthesia Post-op Note  Patient: Alexandra Luna  Procedure(s) Performed: Procedure(s): CARDIOVERSION (N/A)  Patient Location: PACU and Endoscopy Unit  Anesthesia Type:General  Level of Consciousness: awake, oriented, patient cooperative and responds to stimulation  Airway and Oxygen Therapy: Patient Spontanous Breathing and Patient connected to nasal cannula oxygen  Post-op Pain: none  Post-op Assessment: Post-op Vital signs reviewed, Patient's Cardiovascular Status Stable and Respiratory Function Stable  Post-op Vital Signs: Reviewed and stable  Complications: No apparent anesthesia complications

## 2012-11-12 NOTE — Interval H&P Note (Signed)
History and Physical Interval Note: Pt presents for out patient cardioversion. She has been on eliquis in adition to propofenone 150 mg tid, atenolol  25 mg and verapamil 240 mg daily. For [facility] today. ECG shows AF in 80's.  11/12/2012 1:08 PM  Alexandra Luna  has presented today for surgery, with the diagnosis of afib  The various methods of treatment have been discussed with the patient and family. After consideration of risks, benefits and other options for treatment, the patient has consented to  Procedure(s): CARDIOVERSION (N/A) as a surgical intervention .  The patient's history has been reviewed, patient examined, no change in status, stable for surgery.  I have reviewed the patient's chart and labs.  Questions were answered to the patient's satisfaction.     KELLY,THOMAS A

## 2012-11-12 NOTE — Preoperative (Signed)
Beta Blockers   Reason not to administer Beta Blockers:Not Applicable 

## 2012-11-12 NOTE — Interval H&P Note (Signed)
History and Physical Interval Note:  11/12/2012 1:06 PM  Alexandra Luna  has presented today for surgery, with the diagnosis of afib  The various methods of treatment have been discussed with the patient and family. After consideration of risks, benefits and other options for treatment, the patient has consented to  Procedure(s): CARDIOVERSION (N/A) as a surgical intervention .  The patient's history has been reviewed, patient examined, no change in status, stable for surgery.  I have reviewed the patient's chart and labs.  Questions were answered to the patient's satisfaction.     KELLY,THOMAS A

## 2012-11-13 ENCOUNTER — Encounter (HOSPITAL_COMMUNITY): Payer: Self-pay | Admitting: Cardiovascular Disease

## 2012-11-14 NOTE — Progress Notes (Signed)
Quick Note:  Chest xray was pre procedure. Dr. Tresa Endo will discuss at time of the cardioversion. ______

## 2012-12-08 ENCOUNTER — Ambulatory Visit (INDEPENDENT_AMBULATORY_CARE_PROVIDER_SITE_OTHER): Payer: Medicare Other | Admitting: Cardiovascular Disease

## 2012-12-08 ENCOUNTER — Encounter: Payer: Self-pay | Admitting: Cardiovascular Disease

## 2012-12-08 VITALS — BP 100/60 | HR 70 | Ht 66.0 in | Wt 175.3 lb

## 2012-12-08 DIAGNOSIS — Z7901 Long term (current) use of anticoagulants: Secondary | ICD-10-CM | POA: Insufficient documentation

## 2012-12-08 DIAGNOSIS — R002 Palpitations: Secondary | ICD-10-CM

## 2012-12-08 DIAGNOSIS — Z853 Personal history of malignant neoplasm of breast: Secondary | ICD-10-CM

## 2012-12-08 DIAGNOSIS — I10 Essential (primary) hypertension: Secondary | ICD-10-CM

## 2012-12-08 DIAGNOSIS — I4891 Unspecified atrial fibrillation: Secondary | ICD-10-CM

## 2012-12-08 DIAGNOSIS — E782 Mixed hyperlipidemia: Secondary | ICD-10-CM

## 2012-12-08 MED ORDER — ATENOLOL 50 MG PO TABS: 50.0000 mg | ORAL_TABLET | Freq: Two times a day (BID) | ORAL | Status: DC

## 2012-12-08 NOTE — Progress Notes (Signed)
Patient ID: Alexandra Luna, female   DOB: Apr 17, 1926, 79 y.o.   MRN: [id number]     HPI: Alexandra Luna, is a 79 y.o. female to the office today for followup of her recent outpatient DC cardioversion which was done on 11/12/2012 4 atrial fibrillation of questionable duration.  [name] has a history of hypertension, palpitations, mixed hyperlipidemia and earlier this summer was found to be in atrial fibrillation of questionable duration. Her last documented sinus rhythm had been in December 2013 when she was seen in June 2014 she was in atrial fibrillation. At that time she was started on eloquence 2.5 twice a day for anticoagulation. An echo Doppler study showed vigorous systolic function. Tissue Doppler suggested elevated filling pressures. She had moderate mitral regurgitation, moderate LA dilatation, mild RA dilatation and moderate RV dilatation. We have started her on Propofenone which was titrated up to 225 twice a day. She was brought into [facility] on 11/12/2012 and underwent successful cardioversion with one shock of 120 J with restoration of sinus rhythm. Since that time, she states she has felt fairly well. She is unaware of any palpitations. She is unaware of any bleeding. She denies significant shortness of breath. Has been very busy and states she has not checked her blood pressure at home. She status he does note some lightheadedness if he stands up abruptly.  Past Medical History  Diagnosis Date  . Breast cancer   . Renal hypertension 03/07/2005    no evidence of significant diameter reduction, dissection, tortuosity, FMD or orther vascular abnormality; kidneys equal in size, symmetry, with normal cortex and medulla  . Palpitations   . Mixed hyperlipidemia   . Anxiety   . Atrial fibrillation     Past Surgical History  Procedure Laterality Date  . Breast lumpectomy    . Tonsillectomy    . Cesarean section      x 3  . Doppler echocardiography  08/29/2011    EF >55%;  LV  systolic fcn normal; doppler flow pattern suggestive of impaired LV relaxation; LA mildly dilated; aortic valve mildly sclerotic, no aortic stenosis  . Cardiovascular stress test  526/2011    R/Dipyridamole - EF 71%; normal myocaridal perfusion w/o evidence of ischemia or infarct; no schintigraphic evidence of inducible myocaridal ischemia; global LV function normal; EKG negative for ischemia; low risk scan   . Cardioversion N/A 11/12/2012    Procedure: CARDIOVERSION;  Surgeon: Lennette Bihari, MD;  Location: [facility] ENDOSCOPY;  Service: Cardiovascular;  Laterality: N/A;    Allergies  Allergen Reactions  . Adhesive [Tape]     Adhesive bandaids  . Lipitor [Atorvastatin]   . Lovaza [Omega-3-Acid Ethyl Esters]   . Other     Z-Pack  . Penicillins   . Tarka [Trandolapril-Verapamil Hcl Er]     Current Outpatient Prescriptions  Medication Sig Dispense Refill  . acetaminophen (TYLENOL) 500 MG tablet Take 1,000 mg by mouth at bedtime.       . ALPRAZolam (XANAX) 0.25 MG tablet Take 0.25 mg by mouth at bedtime as needed for sleep.       Marland Kitchen apixaban (ELIQUIS) 2.5 MG TABS tablet Take 1 tablet (2.5 mg total) by mouth 2 (two) times daily.  60 tablet  6  . atenolol (TENORMIN) 50 MG tablet Take 25 mg by mouth 2 (two) times daily.       . calcium carbonate (OS-CAL) 600 MG TABS Take 600 mg by mouth 2 (two) times daily with a meal.        .  fish oil-omega-3 fatty acids 1000 MG capsule Take 2 g by mouth daily.       . fluticasone (FLONASE) 50 MCG/ACT nasal spray Place 1 spray into the nose daily.       . hydrochlorothiazide (HYDRODIURIL) 25 MG tablet Take 1 tablet (25 mg total) by mouth daily.  90 tablet  3  . loratadine (CLARITIN) 10 MG tablet Take 10 mg by mouth daily.        . Multiple Vitamins-Minerals (ICAPS MV) TABS Take 1 tablet by mouth daily.      . propafenone (RYTHMOL) 150 MG tablet Take 1 tablet (150 mg total) by mouth every 8 (eight) hours.  60 tablet  6  . rosuvastatin (CRESTOR) 10 MG tablet Take 10  mg by mouth daily.        . valsartan (DIOVAN) 40 MG tablet Take 1 tablet (40 mg total) by mouth daily.  30 tablet  6  . verapamil (COVERA HS) 240 MG (CO) 24 hr tablet Take 240 mg by mouth daily.         No current facility-administered medications for this visit.    History   Social History  . Marital Status: Widowed    Spouse Name: N/A    Number of Children: N/A  . Years of Education: N/A   Occupational History  . Not on file.   Social History Main Topics  . Smoking status: Former Smoker    Types: Cigarettes  . Smokeless tobacco: Not on file     Comment: patient quit smoking around 1994  . Alcohol Use: Yes     Comment: 1/4 glass of wine every 2 weeks  . Drug Use: No  . Sexual Activity: Not on file   Other Topics Concern  . Not on file   Social History Narrative  . No narrative on file    Family History  Problem Relation Age of Onset  . Heart failure Mother   . Breast cancer Mother   . Hypertension Father   . Kidney cancer Father   . Stroke Maternal Grandmother     ROS is negative for fevers, chills or night sweats.  She is unaware of palpitations. She denies bleeding. She denies chest pressure. She denies abdominal pain. She denies change in bowel or bladder habits. She denies significant edema. She denies paresthesias Other system review is negative.  PE BP 100/60  Pulse 70  Ht 5\' 6"  (1.676 m)  Wt 175 lb 4.8 oz (79.516 kg)  BMI 28.31 kg/m2  Repeat blood pressure by me was 135/70 supine and actually was 140/70 standing without significant pulse rise. General: Alert, oriented, no distress.  Skin: normal turgor, no rashes HEENT: Normocephalic, atraumatic. Pupils round and reactive; sclera anicteric;no lid lag.  Nose without nasal septal hypertrophy Mouth/Parynx benign; Mallinpatti scale 3 Neck: No JVD, no carotid briuts Lungs: clear to ausculatation and percussion; no wheezing or rales Heart: Irregularly irregular rhythm with a rate in the 70s to 80s  concordant with recurrent atrial fibrillation, s1 s2 normal \; 1/6 systolic murmur Abdomen: soft, nontender; no hepatosplenomehaly, BS+; abdominal aorta nontender and not dilated by palpation. Pulses 2+ Extremities: no clubbing cyanosis or edema, Homan's sign negative  Neurologic: grossly nonfocal  ECG: Atrial fibrillation rate in the 70s. Nonspecific ST-T changes  LABS:  BMET    Component Value Date/Time   NA 138 11/06/2012 0929   NA 141 04/21/2012 0925   K 3.6 11/06/2012 0929   K 3.6 04/21/2012 0925   CL  103 11/06/2012 0929   CL 102 04/21/2012 0925   CO2 31 11/06/2012 0929   CO2 34* 04/21/2012 0925   GLUCOSE 97 11/06/2012 0929   GLUCOSE 125* 04/21/2012 0925   BUN 17 11/06/2012 0929   BUN 18.0 04/21/2012 0925   CREATININE 0.81 11/06/2012 0929   CREATININE 0.8 04/21/2012 0925   CREATININE 0.76 03/16/2011 1355   CALCIUM 9.9 11/06/2012 0929   CALCIUM 10.1 04/21/2012 0925     Hepatic Function Panel     Component Value Date/Time   PROT 6.3 11/06/2012 0929   PROT 6.1* 04/21/2012 0925   ALBUMIN 3.6 11/06/2012 0929   ALBUMIN 3.1* 04/21/2012 0925   AST 15 11/06/2012 0929   AST 19 04/21/2012 0925   ALT 11 11/06/2012 0929   ALT 12 04/21/2012 0925   ALKPHOS 48 11/06/2012 0929   ALKPHOS 59 04/21/2012 0925   BILITOT 0.4 11/06/2012 0929   BILITOT 0.50 04/21/2012 0925     CBC    Component Value Date/Time   WBC 5.8 11/06/2012 0929   WBC 4.7 04/21/2012 1527   RBC 4.37 11/06/2012 0929   RBC 4.04 04/21/2012 1527   HGB 13.1 11/06/2012 0929   HGB 12.6 04/21/2012 1527   HCT 39.2 11/06/2012 0929   HCT 38.3 04/21/2012 1527   PLT 140* 11/06/2012 0929   PLT 154 04/21/2012 1527   MCV 89.7 11/06/2012 0929   MCV 94.8 04/21/2012 1527   MCH 30.0 11/06/2012 0929   MCH 31.2 04/21/2012 1527   MCHC 33.4 11/06/2012 0929   MCHC 32.9 04/21/2012 1527   RDW 14.9 11/06/2012 0929   RDW 13.7 04/21/2012 1527   LYMPHSABS 1.6 04/21/2012 1527   MONOABS 0.7 04/21/2012 1527   EOSABS 0.1 04/21/2012 1527   BASOSABS 0.0 04/21/2012 1527     BNP No results found  for this basename: probnp    Lipid Panel     Component Value Date/Time   CHOL 118 10/08/2012 0910   TRIG 124 10/08/2012 0910   HDL 50 10/08/2012 0910   CHOLHDL 2.4 10/08/2012 0910   VLDL 25 10/08/2012 0910   LDLCALC 43 10/08/2012 0910     RADIOLOGY: No results found.    ASSESSMENT AND PLAN: Ms. Aine these underwent outpatient DC cardioversion on 11/12/2012 after having been in atrial fibrillation of questionable duration.  Doppler study had shown vigorous LV contractility with biatrial enlargement. EKG today confirms recurrent atrial fibrillation. She continues to be unaware of her tachycardia palpitations or rhythm abnormalities. At this point, it has been is my recommendation that we discontinue antiarrhythmic therapy and continue with rate control of now felt to be permanent atrial fibrillation. I will discontinue her Propafenone and further titrate her atenolol to 50 mg twice a day. She'll continue her current dose of verapamil to 240 mg daily as well as other medications. I will see her in 2 months for followup evaluation or sooner if problems arise.     Lennette Bihari, MD, [facility]  12/08/2012 10:34 AM

## 2012-12-08 NOTE — Patient Instructions (Signed)
Your physician has recommended you make the following change in your medication: STOP THE PROPAFENONE. INCREASE THE ATENOLOL TO 50 MG twice daily. Your physician recommends that you schedule a follow-up appointment in: 2 months.

## 2013-02-05 ENCOUNTER — Other Ambulatory Visit: Payer: Self-pay

## 2013-02-09 ENCOUNTER — Encounter: Payer: Self-pay | Admitting: Cardiovascular Disease

## 2013-02-09 ENCOUNTER — Ambulatory Visit (INDEPENDENT_AMBULATORY_CARE_PROVIDER_SITE_OTHER): Payer: Medicare Other | Admitting: Cardiovascular Disease

## 2013-02-09 VITALS — BP 110/68 | HR 70 | Ht 66.0 in | Wt 174.3 lb

## 2013-02-09 DIAGNOSIS — I10 Essential (primary) hypertension: Secondary | ICD-10-CM

## 2013-02-09 DIAGNOSIS — I4891 Unspecified atrial fibrillation: Secondary | ICD-10-CM

## 2013-02-09 DIAGNOSIS — E782 Mixed hyperlipidemia: Secondary | ICD-10-CM

## 2013-02-09 DIAGNOSIS — Z7901 Long term (current) use of anticoagulants: Secondary | ICD-10-CM

## 2013-02-09 NOTE — Patient Instructions (Signed)
Your physician recommends that you schedule a follow-up appointment in: 6 MONTHS. No changes were made today in your therapy. 

## 2013-02-09 NOTE — Progress Notes (Signed)
Patient ID: Alexandra Luna, female   DOB: [birth year], 79 y.o.   MRN: [id number]      HPI: Alexandra Luna, is a 79 y.o. female to the office today for followup cardiology evaluation.   Ms. Vermette has a history of hypertension, palpitations, mixed hyperlipidemia and earlier this summer was found to be in atrial fibrillation of questionable duration. Her last documented sinus rhythm had been in December 2013. When she was seen in June 2014 she was in atrial fibrillation. At that time she was started on Eliquis 2.5 twice a day for anticoagulation. An echo Doppler study showed vigorous systolic function. Tissue Doppler suggested elevated filling pressures. She had moderate mitral regurgitation, moderate LA dilatation, mild RA dilatation and moderate RV dilatation. She was initially treated with Propofenone which was titrated up to 225 twice a day. On 11/12/2012 she underwent successful cardioversion with one shock of 120 J with restoration of sinus rhythm. Since that time, she states she has felt fairly well. She is unaware of any palpitations. She is unaware of any bleeding. When I saw her back in the office in followup for cardioversion, unfortunately, she was back in atrial fibrillation with a controlled ventricular rate. At that time, we decided to discontinue her antiarrhythmic therapy and maintain her in permanent atrial fibrillation.  Proprofenone was dc'd and atenolol was titrated for rate control. The past several months, she has felt well. She believes her blood pressure has been controlled. Her valsartan dose had been reduced to 40 mg.  Past Medical History  Diagnosis Date  . Breast cancer   . Renal hypertension 03/07/2005    no evidence of significant diameter reduction, dissection, tortuosity, FMD or orther vascular abnormality; kidneys equal in size, symmetry, with normal cortex and medulla  . Palpitations   . Mixed hyperlipidemia   . Anxiety   . Atrial fibrillation     Past Surgical  History  Procedure Laterality Date  . Breast lumpectomy    . Tonsillectomy    . Cesarean section      x 3  . Doppler echocardiography  08/29/2011    EF >55%;  LV systolic fcn normal; doppler flow pattern suggestive of impaired LV relaxation; LA mildly dilated; aortic valve mildly sclerotic, no aortic stenosis  . Cardiovascular stress test  526/2011    R/Dipyridamole - EF 71%; normal myocaridal perfusion w/o evidence of ischemia or infarct; no schintigraphic evidence of inducible myocaridal ischemia; global LV function normal; EKG negative for ischemia; low risk scan   . Cardioversion N/A 11/12/2012    Procedure: CARDIOVERSION;  Surgeon: Lennette Bihari, MD;  Location: [facility] ENDOSCOPY;  Service: Cardiovascular;  Laterality: N/A;    Allergies  Allergen Reactions  . Adhesive [Tape]     Adhesive bandaids  . Lipitor [Atorvastatin]   . Lovaza [Omega-3-Acid Ethyl Esters]   . Other     Z-Pack  . Penicillins   . Tarka [Trandolapril-Verapamil Hcl Er]     Current Outpatient Prescriptions  Medication Sig Dispense Refill  . acetaminophen (TYLENOL) 500 MG tablet Take 1,000 mg by mouth at bedtime.       . ALPRAZolam (XANAX) 0.25 MG tablet Take 0.25 mg by mouth at bedtime as needed for sleep.       Marland Kitchen apixaban (ELIQUIS) 2.5 MG TABS tablet Take 1 tablet (2.5 mg total) by mouth 2 (two) times daily.  60 tablet  6  . atenolol (TENORMIN) 50 MG tablet Take 1 tablet (50 mg total) by mouth 2 (two) times  daily.  180 tablet  3  . calcium carbonate (OS-CAL) 600 MG TABS Take 600 mg by mouth 2 (two) times daily with a meal.        . fish oil-omega-3 fatty acids 1000 MG capsule Take 2 g by mouth daily.       . fluticasone (FLONASE) 50 MCG/ACT nasal spray Place 1 spray into the nose daily.       . hydrochlorothiazide (HYDRODIURIL) 25 MG tablet Take 1 tablet (25 mg total) by mouth daily.  90 tablet  3  . loratadine (CLARITIN) 10 MG tablet Take 10 mg by mouth daily.        . Multiple Vitamins-Minerals (ICAPS MV) TABS  Take 1 tablet by mouth daily.      . rosuvastatin (CRESTOR) 10 MG tablet Take 10 mg by mouth daily.        . valsartan (DIOVAN) 40 MG tablet Take 1 tablet (40 mg total) by mouth daily.  30 tablet  6  . verapamil (COVERA HS) 240 MG (CO) 24 hr tablet Take 240 mg by mouth daily.         No current facility-administered medications for this visit.    History   Social History  . Marital Status: Widowed    Spouse Name: N/A    Number of Children: N/A  . Years of Education: N/A   Occupational History  . Not on file.   Social History Main Topics  . Smoking status: Former Smoker    Types: Cigarettes  . Smokeless tobacco: Not on file     Comment: patient quit smoking around 1994  . Alcohol Use: Yes     Comment: 1/4 glass of wine every 2 weeks  . Drug Use: No  . Sexual Activity: Not on file   Other Topics Concern  . Not on file   Social History Narrative  . No narrative on file    Family History  Problem Relation Age of Onset  . Heart failure Mother   . Breast cancer Mother   . Hypertension Father   . Kidney cancer Father   . Stroke Maternal Grandmother     ROS is negative for fevers, chills or night sweats. She denies cough or sputum production. She is unaware of any wheezing. She denies skin changes. She is unaware of palpitations. She denies bleeding. She denies chest pressure. She denies abdominal pain. She denies change in bowel or bladder habits. She denies significant edema. She denies paresthesias. She denies myalgias. She denies any orthostatic symptoms. There are no tremors. Other contents of 12 system review is negative.  PE BP 110/68  Pulse 70  Ht 5\' 6"  (1.676 m)  Wt 174 lb 4.8 oz (79.062 kg)  BMI 28.15 kg/m2  Repeat blood pressure by me was 135/70 supine and actually was 140/70 standing without significant pulse rise. General: Alert, oriented, no distress.  Skin: normal turgor, no rashes HEENT: Normocephalic, atraumatic. Pupils round and reactive; sclera  anicteric;no lid lag.  Nose without nasal septal hypertrophy Mouth/Parynx benign; Mallinpatti scale 3 Neck: No JVD, no carotid briuts Lungs: clear to ausculatation and percussion; no wheezing or rales Heart: Irregularly irregular rhythm with a rate in the 70s  concordant with recurrent atrial fibrillation, s1 s2 normal ; 1/6 systolic murmur Abdomen: soft, nontender; no hepatosplenomehaly, BS+; abdominal aorta nontender and not dilated by palpation. Pulses 2+ Extremities: no clubbing cyanosis or edema, Homan's sign negative  Neurologic: grossly nonfocal  ECG: Atrial fibrillation rate ~ 70.  Nonspecific  ST-T changes; rare PVC.  LABS:  BMET    Component Value Date/Time   NA 138 11/06/2012 0929   NA 141 04/21/2012 0925   K 3.6 11/06/2012 0929   K 3.6 04/21/2012 0925   CL 103 11/06/2012 0929   CL 102 04/21/2012 0925   CO2 31 11/06/2012 0929   CO2 34* 04/21/2012 0925   GLUCOSE 97 11/06/2012 0929   GLUCOSE 125* 04/21/2012 0925   BUN 17 11/06/2012 0929   BUN 18.0 04/21/2012 0925   CREATININE 0.81 11/06/2012 0929   CREATININE 0.8 04/21/2012 0925   CREATININE 0.76 03/16/2011 1355   CALCIUM 9.9 11/06/2012 0929   CALCIUM 10.1 04/21/2012 0925     Hepatic Function Panel     Component Value Date/Time   PROT 6.3 11/06/2012 0929   PROT 6.1* 04/21/2012 0925   ALBUMIN 3.6 11/06/2012 0929   ALBUMIN 3.1* 04/21/2012 0925   AST 15 11/06/2012 0929   AST 19 04/21/2012 0925   ALT 11 11/06/2012 0929   ALT 12 04/21/2012 0925   ALKPHOS 48 11/06/2012 0929   ALKPHOS 59 04/21/2012 0925   BILITOT 0.4 11/06/2012 0929   BILITOT 0.50 04/21/2012 0925     CBC    Component Value Date/Time   WBC 5.8 11/06/2012 0929   WBC 4.7 04/21/2012 1527   RBC 4.37 11/06/2012 0929   RBC 4.04 04/21/2012 1527   HGB 13.1 11/06/2012 0929   HGB 12.6 04/21/2012 1527   HCT 39.2 11/06/2012 0929   HCT 38.3 04/21/2012 1527   PLT 140* 11/06/2012 0929   PLT 154 04/21/2012 1527   MCV 89.7 11/06/2012 0929   MCV 94.8 04/21/2012 1527   MCH 30.0 11/06/2012 0929   MCH 31.2  04/21/2012 1527   MCHC 33.4 11/06/2012 0929   MCHC 32.9 04/21/2012 1527   RDW 14.9 11/06/2012 0929   RDW 13.7 04/21/2012 1527   LYMPHSABS 1.6 04/21/2012 1527   MONOABS 0.7 04/21/2012 1527   EOSABS 0.1 04/21/2012 1527   BASOSABS 0.0 04/21/2012 1527     BNP No results found for this basename: probnp    Lipid Panel     Component Value Date/Time   CHOL 118 10/08/2012 0910   TRIG 124 10/08/2012 0910   HDL 50 10/08/2012 0910   CHOLHDL 2.4 10/08/2012 0910   VLDL 25 10/08/2012 0910   LDLCALC 43 10/08/2012 0910     RADIOLOGY: No results found.    ASSESSMENT AND PLAN: Ms. Sonnier is now in permanent atrial fibrillation with rate control. She is on chronic anticoagulation with Eliquis 2.5 mg bid and is tolerating this well without bleeding. Her blood pressure today is controlled. She is unaware of her rate going fast. She denies any issues with sleep. She is on Crestor for hyperlipidemia as well as fish oil. I have recommended continued activity and exercise. She'll continue the atenolol at 50 mg twice a day and Verapamil SR 240 mg daily for rate control. I will see her in 6 months for cardiology reevaluation or sooner if problems arise. Lennette Bihari, MD, [facility]  02/09/2013 10:53 AM

## 2013-02-10 ENCOUNTER — Encounter: Payer: Self-pay | Admitting: Cardiovascular Disease

## 2013-02-11 ENCOUNTER — Ambulatory Visit: Payer: Medicare Other | Admitting: Cardiovascular Disease

## 2013-03-11 ENCOUNTER — Other Ambulatory Visit: Payer: Self-pay | Admitting: *Deleted

## 2013-03-11 MED ORDER — VERAPAMIL HCL ER 240 MG PO TBCR: 240.0000 mg | EXTENDED_RELEASE_TABLET | Freq: Every day | ORAL | Status: DC

## 2013-03-19 ENCOUNTER — Telehealth: Payer: Self-pay | Admitting: Cardiovascular Disease

## 2013-03-19 MED ORDER — VERAPAMIL HCL ER 240 MG PO CP24: 240.0000 mg | ORAL_CAPSULE | Freq: Every day | ORAL | Status: DC

## 2013-03-19 NOTE — Telephone Encounter (Signed)
[facility] is calling to get clarification on a prescription refill written by [facility] .Marland Kitchen Verapamil ... Had been previously on the  Verapamil SR  Capsules and tablets were called in and the patient is worried about taking the tablets versus the capsules...  Please call to clarify ....   Thanks

## 2013-03-19 NOTE — Telephone Encounter (Signed)
Consulted w/ Belenda Cruise, PharmD and advised caps can be substituted for tabs as long as the same dose is rx'd.    Rx sent for caps instead of tabs w/ directions to d/c Rx for tabs.

## 2013-03-27 ENCOUNTER — Other Ambulatory Visit: Payer: Self-pay

## 2013-03-27 DIAGNOSIS — Z1231 Encounter for screening mammogram for malignant neoplasm of breast: Secondary | ICD-10-CM

## 2013-03-30 ENCOUNTER — Other Ambulatory Visit: Payer: Self-pay | Admitting: *Deleted

## 2013-03-30 DIAGNOSIS — Z853 Personal history of malignant neoplasm of breast: Secondary | ICD-10-CM

## 2013-03-30 DIAGNOSIS — C50919 Malignant neoplasm of unspecified site of unspecified female breast: Secondary | ICD-10-CM

## 2013-03-30 MED ORDER — ROSUVASTATIN CALCIUM 10 MG PO TABS: 10.0000 mg | ORAL_TABLET | Freq: Every day | ORAL | Status: DC

## 2013-03-30 MED ORDER — VALSARTAN 40 MG PO TABS: 40.0000 mg | ORAL_TABLET | Freq: Every day | ORAL | Status: DC

## 2013-03-30 NOTE — Telephone Encounter (Signed)
Rx was sent to pharmacy electronically. 

## 2013-04-23 ENCOUNTER — Ambulatory Visit
Admission: RE | Admit: 2013-04-23 | Discharge: 2013-04-23 | Disposition: A | Discharge status: Home / Self Care | Payer: Medicare Other | Source: Ambulatory Visit

## 2013-04-23 DIAGNOSIS — Z1231 Encounter for screening mammogram for malignant neoplasm of breast: Secondary | ICD-10-CM

## 2013-05-07 ENCOUNTER — Other Ambulatory Visit: Payer: Self-pay | Admitting: Medical Oncology

## 2013-05-07 DIAGNOSIS — Z853 Personal history of malignant neoplasm of breast: Secondary | ICD-10-CM

## 2013-05-08 ENCOUNTER — Other Ambulatory Visit (HOSPITAL_BASED_OUTPATIENT_CLINIC_OR_DEPARTMENT_OTHER): Payer: Medicare Other

## 2013-05-08 ENCOUNTER — Other Ambulatory Visit: Payer: Self-pay | Admitting: Internal Medicine

## 2013-05-08 ENCOUNTER — Ambulatory Visit (HOSPITAL_COMMUNITY)
Admission: RE | Admit: 2013-05-08 | Discharge: 2013-05-08 | Disposition: A | Discharge status: Home / Self Care | Payer: Medicare Other | Source: Ambulatory Visit | Attending: Internal Medicine | Admitting: Internal Medicine

## 2013-05-08 DIAGNOSIS — Z87891 Personal history of nicotine dependence: Secondary | ICD-10-CM | POA: Insufficient documentation

## 2013-05-08 DIAGNOSIS — Z853 Personal history of malignant neoplasm of breast: Secondary | ICD-10-CM

## 2013-05-08 DIAGNOSIS — I700 Atherosclerosis of aorta: Secondary | ICD-10-CM | POA: Insufficient documentation

## 2013-05-08 DIAGNOSIS — I10 Essential (primary) hypertension: Secondary | ICD-10-CM | POA: Insufficient documentation

## 2013-05-08 DIAGNOSIS — Z9010 Acquired absence of unspecified breast and nipple: Secondary | ICD-10-CM | POA: Insufficient documentation

## 2013-05-08 LAB — COMPREHENSIVE METABOLIC PANEL (CC13)
ALT: 16 U/L (ref 0–55)
AST: 18 U/L (ref 5–34)
Albumin: 3.8 g/dL (ref 3.5–5.0)
Alkaline Phosphatase: 66 U/L (ref 40–150)
Anion Gap: 8 mEq/L (ref 3–11)
BILIRUBIN TOTAL: 0.54 mg/dL (ref 0.20–1.20)
BUN: 17.2 mg/dL (ref 7.0–26.0)
CO2: 31 mEq/L — ABNORMAL HIGH (ref 22–29)
Calcium: 10.7 mg/dL — ABNORMAL HIGH (ref 8.4–10.4)
Chloride: 102 mEq/L (ref 98–109)
Creatinine: 0.8 mg/dL (ref 0.6–1.1)
GLUCOSE: 110 mg/dL (ref 70–140)
Potassium: 4 mEq/L (ref 3.5–5.1)
Sodium: 141 mEq/L (ref 136–145)
Total Protein: 6.8 g/dL (ref 6.4–8.3)

## 2013-05-08 LAB — CBC WITH DIFFERENTIAL/PLATELET
BASO%: 1.3 % (ref 0.0–2.0)
BASOS ABS: 0.1 10*3/uL (ref 0.0–0.1)
EOS ABS: 0.1 10*3/uL (ref 0.0–0.5)
EOS%: 2.8 % (ref 0.0–7.0)
HCT: 38.6 % (ref 34.8–46.6)
HEMOGLOBIN: 12.7 g/dL (ref 11.6–15.9)
LYMPH%: 29.1 % (ref 14.0–49.7)
MCH: 31.5 pg (ref 25.1–34.0)
MCHC: 33 g/dL (ref 31.5–36.0)
MCV: 95.5 fL (ref 79.5–101.0)
MONO#: 0.7 10*3/uL (ref 0.1–0.9)
MONO%: 14.8 % — ABNORMAL HIGH (ref 0.0–14.0)
NEUT#: 2.3 10*3/uL (ref 1.5–6.5)
NEUT%: 52 % (ref 38.4–76.8)
PLATELETS: 116 10*3/uL — AB (ref 145–400)
RBC: 4.04 10*6/uL (ref 3.70–5.45)
RDW: 14.7 % — ABNORMAL HIGH (ref 11.2–14.5)
WBC: 4.5 10*3/uL (ref 3.9–10.3)
lymph#: 1.3 10*3/uL (ref 0.9–3.3)

## 2013-05-09 LAB — CANCER ANTIGEN 27.29: CA 27.29: 27 U/mL (ref 0–39)

## 2013-05-11 ENCOUNTER — Encounter: Payer: Self-pay | Admitting: Internal Medicine

## 2013-05-11 ENCOUNTER — Ambulatory Visit (HOSPITAL_BASED_OUTPATIENT_CLINIC_OR_DEPARTMENT_OTHER): Payer: Medicare Other | Admitting: Internal Medicine

## 2013-05-11 ENCOUNTER — Telehealth: Payer: Self-pay | Admitting: *Deleted

## 2013-05-11 VITALS — BP 143/83 | HR 64 | Temp 97.3°F | Resp 20 | Ht 66.0 in | Wt 171.6 lb

## 2013-05-11 DIAGNOSIS — Z853 Personal history of malignant neoplasm of breast: Secondary | ICD-10-CM

## 2013-05-11 NOTE — Telephone Encounter (Signed)
sw pt gv appts for 05/11/14 to have labs@ 10am. i also gv ov for 05/18/14@9am . Pt is already aware of her cxr....td

## 2013-05-11 NOTE — Progress Notes (Signed)
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   PRIOR THERAPY:  1. Status post left breast lumpectomy on 01/31/1998 with left axillary lymph node dissection, and the tumor was ER/PR positive, and there was no positive lymph node identified.  2. Status post radiotherapy to the left breast.  3. Status post 5 years of treatment with tamoxifen, ended 04/2002.  4. Status post 5 years of treatment with Femara 2.5 mg p.o. daily, completed 04/2007.   CURRENT THERAPY: Observation.  INTERVAL HISTORY: Alexandra Luna 80 y.o. female returns to the clinic today for annual followup visit. The patient is feeling fine today with no specific complaints. The patient denied having any significant weight loss or night sweats. She denied having any chest pain, shortness of breath, cough or hemoptysis. She has atrial fibrillation She had repeat CBC, comprehensive metabolic panel and chest x-ray performed recently and she is here for evaluation and discussion of her lab and imaging results. The patient also had a recent mammogram that was unremarkable.  MEDICAL HISTORY: Past Medical History  Diagnosis Date  . Breast cancer   . Renal hypertension 03/07/2005    no evidence of significant diameter reduction, dissection, tortuosity, FMD or orther vascular abnormality; kidneys equal in size, symmetry, with normal cortex and medulla  . Palpitations   . Mixed hyperlipidemia   . Anxiety   . Atrial fibrillation     ALLERGIES:  is allergic to adhesive; lipitor; lovaza; other; penicillins; and tarka.  MEDICATIONS:  Current Outpatient Prescriptions  Medication Sig Dispense Refill  . acetaminophen (TYLENOL) 500 MG tablet Take 1,000 mg by mouth at bedtime.       . ALPRAZolam (XANAX) 0.25 MG tablet Take 0.25  mg by mouth at bedtime as needed for sleep.       Marland Kitchen apixaban (ELIQUIS) 2.5 MG TABS tablet Take 1 tablet (2.5 mg total) by mouth 2 (two) times daily.  60 tablet  6  . atenolol (TENORMIN) 50 MG tablet Take 1 tablet (50 mg total) by mouth 2 (two) times daily.  180 tablet  3  . calcium carbonate (OS-CAL) 600 MG TABS Take 600 mg by mouth 2 (two) times daily with a meal.        . fish oil-omega-3 fatty acids 1000 MG capsule Take 2 g by mouth daily.       . fluticasone (FLONASE) 50 MCG/ACT nasal spray Place 1 spray into the nose daily.       . hydrochlorothiazide (HYDRODIURIL) 25 MG tablet Take 1 tablet (25 mg total) by mouth daily.  90 tablet  3  . loratadine (CLARITIN) 10 MG tablet Take 10 mg by mouth daily.        . Multiple Vitamins-Minerals (ICAPS MV) TABS Take 1 tablet by mouth daily.      . rosuvastatin (CRESTOR) 10 MG tablet Take 1 tablet (10 mg total) by mouth daily.  30 tablet  10  . valsartan (DIOVAN) 40 MG tablet Take 1 tablet (40 mg total) by mouth daily.  30 tablet  10  . verapamil (VERELAN PM) 240 MG 24 hr capsule Take 1 capsule (240 mg total) by mouth at bedtime.  90 capsule  3   No current facility-administered medications for this visit.    SURGICAL HISTORY:  Past Surgical History  Procedure Laterality Date  . Breast lumpectomy    . Tonsillectomy    . Cesarean section      x 3  . Doppler echocardiography  08/29/2011    EF [id number];  LV systolic fcn normal; doppler flow pattern suggestive of impaired LV relaxation; LA mildly dilated; aortic valve mildly sclerotic, no aortic stenosis  . Cardiovascular stress test  526/2011    R/Dipyridamole - EF 71%; normal myocaridal perfusion w/o evidence of ischemia or infarct; no schintigraphic evidence of inducible myocaridal ischemia; global LV function normal; EKG negative for ischemia; low risk scan   . Cardioversion N/A 11/12/2012    Procedure: CARDIOVERSION;  Surgeon: Troy Sine, MD;  Location: [facility] ENDOSCOPY;  Service: Cardiovascular;   Laterality: N/A;    REVIEW OF SYSTEMS:  A comprehensive review of systems was negative.   PHYSICAL EXAMINATION: General appearance: alert, cooperative and no distress Head: Normocephalic, without obvious abnormality, atraumatic Neck: no adenopathy Lymph nodes: Cervical, supraclavicular, and axillary nodes normal. Resp: clear to auscultation bilaterally Cardio: irregularly irregular rhythm GI: soft, non-tender; bowel sounds normal; no masses,  no organomegaly Extremities: extremities normal, atraumatic, no cyanosis or edema Breast exam done with the chappron of my nurse Rudene Anda showed no palpable masses bilaterally and no palpable lymphadenopathy in the axilla.  ECOG PERFORMANCE STATUS: 1 - Symptomatic but completely ambulatory  Blood pressure 143/83, pulse 64, temperature 97.3 F (36.3 C), temperature source Oral, resp. rate 20, height 5\' 6"  (1.676 m), weight 171 lb 9.6 oz (77.837 kg), SpO2 100.00%.  LABORATORY DATA: Lab Results  Component Value Date   WBC 4.5 05/08/2013   HGB 12.7 05/08/2013   HCT 38.6 05/08/2013   MCV 95.5 05/08/2013   PLT 116* 05/08/2013      Chemistry      Component Value Date/Time   NA 141 05/08/2013 0950   NA 138 11/06/2012 0929   K 4.0 05/08/2013 0950   K 3.6 11/06/2012 0929   CL 103 11/06/2012 0929   CL 102 04/21/2012 0925   CO2 31* 05/08/2013 0950   CO2 31 11/06/2012 0929   BUN 17.2 05/08/2013 0950   BUN 17 11/06/2012 0929   CREATININE 0.8 05/08/2013 0950   CREATININE 0.81 11/06/2012 0929   CREATININE 0.76 03/16/2011 1355      Component Value Date/Time   CALCIUM 10.7* 05/08/2013 0950   CALCIUM 9.9 11/06/2012 0929   ALKPHOS 66 05/08/2013 0950   ALKPHOS 48 11/06/2012 0929   AST 18 05/08/2013 0950   AST 15 11/06/2012 0929   ALT 16 05/08/2013 0950   ALT 11 11/06/2012 0929   BILITOT 0.54 05/08/2013 0950   BILITOT 0.4 11/06/2012 0929       RADIOGRAPHIC STUDIES:  Dg Chest 2 View  05/08/2013   CLINICAL DATA:  Personal history of breast cancer in 1998. History of atrial  fibrillation, hypertension and smoking.  EXAM: CHEST  2 VIEW  COMPARISON:  DG CHEST 2 VIEW dated 11/06/2012; DG CHEST 2 VIEW dated 04/21/2012  FINDINGS: Mild cardiomegaly appears stable. The heart size is accentuated by a narrow AP diameter of the chest. There is aortic atherosclerosis. Increased interstitial markings are present at both lung bases compared with the prior study. There is no confluent airspace opacity or significant pleural effusion. Patient is status post left mastectomy and left axillary node dissection. There are no worrisome osseous findings.  IMPRESSION: Mildly increased interstitial prominence at both lung bases compared with the prior study. This could reflect mild interstitial edema or fibrosis. No  evidence of metastatic disease.   Electronically Signed   By: Camie Patience M.D.   On: 05/08/2013 12:31   Mm Digital Screening  04/23/2013   CLINICAL DATA:  Screening. History of left lumpectomy with radiation treatment in 1999.  EXAM: DIGITAL SCREENING BILATERAL MAMMOGRAM WITH CAD  COMPARISON:  Previous exam(s).  ACR Breast Density Category c: The breast tissue is heterogeneously dense, which may obscure small masses.  FINDINGS: There are no findings suspicious for malignancy. Postoperative changes are seen in the left breast. Images were processed with CAD.  IMPRESSION: No mammographic evidence of malignancy. A result letter of this screening mammogram will be mailed directly to the patient.  RECOMMENDATION: Screening mammogram in one year. (Code:SM-B-01Y)  BI-RADS CATEGORY  1: Negative.   Electronically Signed   By: Shon Hale M.D.   On: 04/23/2013 14:06   ASSESSMENT AND PLAN: This is a very pleasant 80 years old white female with history of stage I invasive left breast carcinoma with DCIS diagnosed in 1998 and has been observation since January of 2009 with no evidence for disease recurrence. I discussed the lab and imaging results with the patient. I recommended for her to continue on  observation for now with repeat CBC, comprehensive metabolic panel, CA 93.57 as well as chest x-ray in one year. The patient will also continue with her annual mammogram as scheduled.  All questions were answered. The patient knows to call the clinic with any problems, questions or concerns. We can certainly see the patient much sooner if necessary.  Disclaimer: This note was dictated with voice recognition software. Similar sounding words can inadvertently be transcribed and may not be corrected upon review.

## 2013-05-11 NOTE — Patient Instructions (Signed)
Followup visit in one year with repeat blood work and chest x-ray

## 2013-05-21 ENCOUNTER — Other Ambulatory Visit: Payer: Self-pay | Admitting: Pharmacist Clinician (PhC)/ Clinical Pharmacy Specialist

## 2013-05-21 MED ORDER — APIXABAN 2.5 MG PO TABS: 2.5000 mg | ORAL_TABLET | Freq: Two times a day (BID) | ORAL | Status: DC

## 2013-06-11 ENCOUNTER — Telehealth: Payer: Self-pay | Admitting: Cardiovascular Disease

## 2013-06-11 NOTE — Telephone Encounter (Signed)
Call to pt and verified x 2.  Pt informed of message from pharmacy.  RN asked if there was a reason why she cannot take tablets.  Pt stated they (pharmacy) filled tabs before, but she didn't take them.  Pt wanted to know the difference.  RN explained it would be the same medication and dosage, but a tablet instead of a capsule.  Pt verbalized understanding and stated that will be fine with her.  Stated she has enough to last through Wednesday plus 4 more pills.  Pt informed pharmacy will be notified.  If caps available before refill due, pt would like caps instead of tabs.  Call to pharmacy and spoke with [facility].  Informed message received and RN spoke w/ pt as well.  Informed pt is okay w/ tabs as long as same med/dose, which was explained to pt.  Alexandra Luna stated she does have old script from December 2014 for tabs that is still active although script sent for caps and directions to cancel tabs was received.  Stated she currently does not have caps, but will fill for caps if available when refill due or use script for tabs if not.

## 2013-06-11 NOTE — Telephone Encounter (Signed)
Prescription was written for the Verapamil 240 mg capsules, The capsules are on back order. Pharmacist would like a new prescription for Verapamil 240mg  tablets please.This is if this all right with Dr Claiborne Billings.

## 2013-09-11 ENCOUNTER — Other Ambulatory Visit: Payer: Self-pay | Admitting: *Deleted

## 2013-09-11 MED ORDER — HYDROCHLOROTHIAZIDE 25 MG PO TABS: 25.0000 mg | ORAL_TABLET | Freq: Every day | ORAL | Status: DC

## 2013-09-11 NOTE — Telephone Encounter (Signed)
Rx refill sent to patient pharmacy   

## 2013-09-30 ENCOUNTER — Encounter: Payer: Self-pay | Admitting: Cardiovascular Disease

## 2013-09-30 ENCOUNTER — Ambulatory Visit (INDEPENDENT_AMBULATORY_CARE_PROVIDER_SITE_OTHER): Payer: Medicare Other | Admitting: Cardiovascular Disease

## 2013-09-30 VITALS — BP 140/88 | HR 52 | Ht 66.0 in | Wt 165.7 lb

## 2013-09-30 DIAGNOSIS — Z7901 Long term (current) use of anticoagulants: Secondary | ICD-10-CM

## 2013-09-30 DIAGNOSIS — E782 Mixed hyperlipidemia: Secondary | ICD-10-CM

## 2013-09-30 DIAGNOSIS — I10 Essential (primary) hypertension: Secondary | ICD-10-CM

## 2013-09-30 DIAGNOSIS — I4891 Unspecified atrial fibrillation: Secondary | ICD-10-CM

## 2013-09-30 DIAGNOSIS — I480 Paroxysmal atrial fibrillation: Secondary | ICD-10-CM

## 2013-09-30 NOTE — Patient Instructions (Signed)
Your physician has recommended you make the following change in your medication: adjust the atenolol as directed by Dr. Claiborne Billings.  Your physician recommends that you schedule a follow-up appointment in: 6 months

## 2013-09-30 NOTE — Progress Notes (Signed)
Patient ID: Alexandra Luna, female   DOB: 1926-09-15, 80 y.o.   MRN: [id number]      HPI: Alexandra Luna is a 80 y.o. female to the office today for 8 month followup cardiology evaluation.   Alexandra Luna has a history of hypertension, palpitations, mixed hyperlipidemia and was found to be in atrial fibrillation of questionable duration last summer. Her last documented sinus rhythm had been in December 2013. When she was seen in June 2014 she was in atrial fibrillation. At that time she was started on Eliquis 2.5 twice a day for anticoagulation. An echo Doppler study showed vigorous systolic function. Tissue Doppler suggested elevated filling pressures. She had moderate mitral regurgitation, moderate LA dilatation, mild RA dilatation and moderate RV dilatation. She was initially treated with Propofenone which was titrated up to 225 twice a day. On 11/12/2012 she underwent successful cardioversion with one shock of 120 J with restoration of sinus rhythm.   When I saw her back in the office in followup for cardioversion, unfortunately, she was back in atrial fibrillation with a controlled ventricular rate. At that time, she was entirely asymptomatic we decided to discontinue her antiarrhythmic therapy and maintain her in permanent atrial fibrillation.  Proprofenone was dc'd and atenolol was titrated for rate control.   Over the past 8 months, she has continued to do well.  At times.  She does note that her pulse is in the low 50s.  She denies dizziness.  She denies chest pain.  She is unaware of palpitations.  She is unaware that her rhythm is irregular.  She denies bleeding.  She states Dr. Cheron Schaumann.  Check a complete set of lab work on her recently and she was told that her labs were good.  Past Medical History  Diagnosis Date  . Breast cancer   . Renal hypertension 03/07/2005    no evidence of significant diameter reduction, dissection, tortuosity, FMD or orther vascular abnormality; kidneys equal in size,  symmetry, with normal cortex and medulla  . Palpitations   . Mixed hyperlipidemia   . Anxiety   . Atrial fibrillation     Past Surgical History  Procedure Laterality Date  . Breast lumpectomy    . Tonsillectomy    . Cesarean section      x 3  . Doppler echocardiography  08/29/2011    EF >65%;  LV systolic fcn normal; doppler flow pattern suggestive of impaired LV relaxation; LA mildly dilated; aortic valve mildly sclerotic, no aortic stenosis  . Cardiovascular stress test  526/2011    R/Dipyridamole - EF 71%; normal myocaridal perfusion w/o evidence of ischemia or infarct; no schintigraphic evidence of inducible myocaridal ischemia; global LV function normal; EKG negative for ischemia; low risk scan   . Cardioversion N/A 11/12/2012    Procedure: CARDIOVERSION;  Surgeon: Troy Sine, MD;  Location: [facility] ENDOSCOPY;  Service: Cardiovascular;  Laterality: N/A;    Allergies  Allergen Reactions  . Adhesive [Tape]     Adhesive bandaids  . Lipitor [Atorvastatin]   . Lovaza [Omega-3-Acid Ethyl Esters]   . Other     Z-Pack  . Penicillins   . Tarka [Trandolapril-Verapamil Hcl Er]     Current Outpatient Prescriptions  Medication Sig Dispense Refill  . acetaminophen (TYLENOL) 500 MG tablet Take 1,000 mg by mouth at bedtime.       . ALPRAZolam (XANAX) 0.25 MG tablet Take 0.25 mg by mouth 3 (three) times daily as needed for sleep.       Marland Kitchen  apixaban (ELIQUIS) 2.5 MG TABS tablet Take 1 tablet (2.5 mg total) by mouth 2 (two) times daily.  60 tablet  6  . atenolol (TENORMIN) 50 MG tablet Take 1 tablet (50 mg total) by mouth 2 (two) times daily.  180 tablet  3  . calcium carbonate (OS-CAL) 600 MG TABS Take 600 mg by mouth 2 (two) times daily with a meal.        . DimenhyDRINATE (DRAMAMINE PO) Take by mouth at bedtime as needed.      . fish oil-omega-3 fatty acids 1000 MG capsule Take 2 g by mouth daily.       . fluticasone (FLONASE) 50 MCG/ACT nasal spray Place 1 spray into the nose daily.         . hydrochlorothiazide (HYDRODIURIL) 25 MG tablet Take 1 tablet (25 mg total) by mouth daily.  90 tablet  1  . loratadine (CLARITIN) 10 MG tablet Take 10 mg by mouth daily.        . Multiple Vitamins-Minerals (ICAPS MV) TABS Take 1 tablet by mouth daily.      . rosuvastatin (CRESTOR) 10 MG tablet Take 1 tablet (10 mg total) by mouth daily.  30 tablet  10  . valsartan (DIOVAN) 40 MG tablet Take 1 tablet (40 mg total) by mouth daily.  30 tablet  10  . verapamil (VERELAN PM) 240 MG 24 hr capsule Take 1 capsule (240 mg total) by mouth at bedtime.  90 capsule  3   No current facility-administered medications for this visit.    History   Social History  . Marital Status: Widowed    Spouse Name: N/A    Number of Children: N/A  . Years of Education: N/A   Occupational History  . Not on file.   Social History Main Topics  . Smoking status: Former Smoker    Types: Cigarettes    Quit date: 04/02/1993  . Smokeless tobacco: Never Used     Comment: patient quit smoking around 1994  . Alcohol Use: Yes     Comment: 1/4 glass of wine every 2 weeks  . Drug Use: No  . Sexual Activity: Not on file   Other Topics Concern  . Not on file   Social History Narrative  . No narrative on file   She is widowed.  She quit tobacco in 1994.  She does read avidly.  Family History  Problem Relation Age of Onset  . Heart failure Mother   . Breast cancer Mother   . Hypertension Father   . Kidney cancer Father   . Stroke Maternal Grandmother    ROS General: Negative; No fevers, chills, or night sweats;  HEENT: Negative; No changes in vision or hearing, sinus congestion, difficulty swallowing Pulmonary: Negative; No cough, wheezing, shortness of breath, hemoptysis Cardiovascular: Negative; No chest pain, presyncope, syncope, palpitations GI: Negative; No nausea, vomiting, diarrhea, or abdominal pain GU: Negative; No dysuria, hematuria, or difficulty voiding Musculoskeletal: Negative; no myalgias,  joint pain, or weakness Hematologic/Oncology: Negative; no easy bruising, bleeding , on eloquent Endocrine: Negative; no heat/cold intolerance; no diabetes Neuro: Negative; no changes in balance, headaches Skin: Negative; No rashes or skin lesions Psychiatric: Negative; No behavioral problems, depression Sleep: Negative; No snoring, daytime sleepiness, hypersomnolence, bruxism, restless legs, hypnogognic hallucinations, no cataplexy Other comprehensive 14 point system review is negative.   PE BP 140/88  Pulse 52  Ht 5\' 6"  (1.676 m)  Wt 165 lb 11.2 oz (75.161 kg)  BMI 26.76 kg/m2  Repeat blood pressure by me was 135/70 supine and actually was 140/70 standing without significant pulse rise. General: Alert, oriented, no distress.  Skin: normal turgor, no rashes HEENT: Normocephalic, atraumatic. Pupils round and reactive; sclera anicteric;no lid lag.  Nose without nasal septal hypertrophy Mouth/Parynx benign; Mallinpatti scale 3 Neck: No JVD, no carotid bruits with normal upstroke Lungs: clear to ausculatation and percussion; no wheezing or rales Chest wall: Nontender to palpation Heart: Irregularly irregular rhythm with a rate in the 50s  concordant with recurrent atrial fibrillation, s1 s2 normal ; 1/6 systolic murmur; no diastolic murmur.  No rubs, thrills or heaves Abdomen: soft, nontender; no hepatosplenomehaly, BS+; abdominal aorta nontender and not dilated by palpation. Back: No CVA tenderness Pulses 2+ Extremities: no clubbing cyanosis or edema, Homan's sign negative  Neurologic: grossly nonfocal Psychological: Normal affect and mood.  She seems to be in significantly improved spirits since several years have elapsed since her husbands death.  ECG (independently read by me): Atrial fibrillation with a slow ventricular rate at 52.  Nonspecific ST changes.  QTc interval 402 ms.  November 2014 ECG: Atrial fibrillation rate ~ 70.  Nonspecific ST-T changes; rare  PVC.  LABS:  BMET    Component Value Date/Time   NA 141 05/08/2013 0950   NA 138 11/06/2012 0929   K 4.0 05/08/2013 0950   K 3.6 11/06/2012 0929   CL 103 11/06/2012 0929   CL 102 04/21/2012 0925   CO2 31* 05/08/2013 0950   CO2 31 11/06/2012 0929   GLUCOSE 110 05/08/2013 0950   GLUCOSE 97 11/06/2012 0929   GLUCOSE 125* 04/21/2012 0925   BUN 17.2 05/08/2013 0950   BUN 17 11/06/2012 0929   CREATININE 0.8 05/08/2013 0950   CREATININE 0.81 11/06/2012 0929   CREATININE 0.76 03/16/2011 1355   CALCIUM 10.7* 05/08/2013 0950   CALCIUM 9.9 11/06/2012 0929     Hepatic Function Panel     Component Value Date/Time   PROT 6.8 05/08/2013 0950   PROT 6.3 11/06/2012 0929   ALBUMIN 3.8 05/08/2013 0950   ALBUMIN 3.6 11/06/2012 0929   AST 18 05/08/2013 0950   AST 15 11/06/2012 0929   ALT 16 05/08/2013 0950   ALT 11 11/06/2012 0929   ALKPHOS 66 05/08/2013 0950   ALKPHOS 48 11/06/2012 0929   BILITOT 0.54 05/08/2013 0950   BILITOT 0.4 11/06/2012 0929     CBC    Component Value Date/Time   WBC 4.5 05/08/2013 0950   WBC 5.8 11/06/2012 0929   RBC 4.04 05/08/2013 0950   RBC 4.37 11/06/2012 0929   HGB 12.7 05/08/2013 0950   HGB 13.1 11/06/2012 0929   HCT 38.6 05/08/2013 0950   HCT 39.2 11/06/2012 0929   PLT 116* 05/08/2013 0950   PLT 140* 11/06/2012 0929   MCV 95.5 05/08/2013 0950   MCV 89.7 11/06/2012 0929   MCH 31.5 05/08/2013 0950   MCH 30.0 11/06/2012 0929   MCHC 33.0 05/08/2013 0950   MCHC 33.4 11/06/2012 0929   RDW 14.7* 05/08/2013 0950   RDW 14.9 11/06/2012 0929   LYMPHSABS 1.3 05/08/2013 0950   MONOABS 0.7 05/08/2013 0950   EOSABS 0.1 05/08/2013 0950   BASOSABS 0.1 05/08/2013 0950     BNP No results found for this basename: probnp    Lipid Panel     Component Value Date/Time   CHOL 118 10/08/2012 0910   TRIG 124 10/08/2012 0910   HDL 50 10/08/2012 0910   CHOLHDL 2.4 10/08/2012 0910   VLDL  25 10/08/2012 0910   LDLCALC 43 10/08/2012 0910     RADIOLOGY: No results found.    ASSESSMENT AND PLAN:  Ms. Luper is now in permanent atrial fibrillation with rate  control. She is on chronic anticoagulation with Eliquis 2.5 mg bid and is tolerating this well without bleeding. Her blood pressure today is controlled. She is unaware of her rate going fast , but she tells her when she was seen by Dr. Morton Stall.  Her heart rate was also in the low 50s.  I am recommending she reduce her atenolol dose from 50 mg twice a day 2, initially, 50 mg in the morning and 25 mg at night.  If her resting pulse is still consistently in the low 50s.  After 2, weeks, we'll further reduce this to 25 mg twice a day.  She will continue her present dose of verapamil as well as valsartan.  She also takes HCTZ for blood pressure.  There no signs of peripheral edema today.  She denies any issues with sleep. She is on Crestor for hyperlipidemia as well as fish oil. I have recommended continued activity and exercise. I will see her in 6 months for cardiology reevaluation or sooner if problems arise.   Troy Sine, MD, [facility]  09/30/2013 11:21 AM

## 2013-11-02 ENCOUNTER — Telehealth: Payer: Self-pay | Admitting: *Deleted

## 2013-11-02 NOTE — Telephone Encounter (Signed)
Faxed Eliquis 2.5 mg approval to pharmacy. ( [facility])

## 2013-12-23 ENCOUNTER — Other Ambulatory Visit: Payer: Self-pay | Admitting: Pharmacist Clinician (PhC)/ Clinical Pharmacy Specialist

## 2013-12-23 MED ORDER — APIXABAN 2.5 MG PO TABS: 2.5000 mg | ORAL_TABLET | Freq: Two times a day (BID) | ORAL | Status: DC

## 2013-12-25 ENCOUNTER — Telehealth: Payer: Self-pay | Admitting: Cardiovascular Disease

## 2013-12-25 NOTE — Telephone Encounter (Signed)
Pt called in stating that her script for Eliquis is being denied by her insurance and would like a generic brand called in for her because she will be out of the medication on 9/28. Please call  Thanks

## 2013-12-25 NOTE — Telephone Encounter (Signed)
INFORMED PATIENT   MEDICATION HAS BEEN AUTHORIZE FOR ONE YEAR  PHARMACY  AWARE

## 2013-12-25 NOTE — Telephone Encounter (Signed)
Prior authorization  Completed for ELIQUIS DX 427.31  APPORVED FOR ONE YEAR.

## 2014-01-15 ENCOUNTER — Other Ambulatory Visit: Payer: Self-pay

## 2014-02-15 ENCOUNTER — Other Ambulatory Visit: Payer: Self-pay

## 2014-02-15 ENCOUNTER — Telehealth: Payer: Self-pay | Admitting: Cardiovascular Disease

## 2014-02-15 DIAGNOSIS — Z853 Personal history of malignant neoplasm of breast: Secondary | ICD-10-CM

## 2014-02-15 MED ORDER — ATENOLOL 50 MG PO TABS: ORAL_TABLET | ORAL | Status: DC

## 2014-02-15 MED ORDER — ATENOLOL 50 MG PO TABS: 50.0000 mg | ORAL_TABLET | Freq: Two times a day (BID) | ORAL | Status: DC

## 2014-02-15 MED ORDER — VALSARTAN 40 MG PO TABS: 40.0000 mg | ORAL_TABLET | Freq: Every day | ORAL | Status: DC

## 2014-02-15 NOTE — Telephone Encounter (Signed)
Pt says [facility] need more information to refill her Eliquis. Please call today to [place]

## 2014-02-15 NOTE — Telephone Encounter (Signed)
Rx sent to pharmacy   

## 2014-02-15 NOTE — Telephone Encounter (Signed)
Spoke with pt PHARM, THEY ARE QUESTIONING THE DOSAGE OF ATENOLOL. Confirmed atenolol is 50 mg in the am and 25 mg in the pm

## 2014-03-11 ENCOUNTER — Ambulatory Visit (INDEPENDENT_AMBULATORY_CARE_PROVIDER_SITE_OTHER): Payer: Medicare Other | Admitting: Cardiovascular Disease

## 2014-03-11 ENCOUNTER — Encounter: Payer: Self-pay | Admitting: Cardiovascular Disease

## 2014-03-11 VITALS — BP 120/72 | HR 51 | Ht 66.0 in | Wt 165.7 lb

## 2014-03-11 DIAGNOSIS — E782 Mixed hyperlipidemia: Secondary | ICD-10-CM

## 2014-03-11 DIAGNOSIS — I482 Chronic atrial fibrillation, unspecified: Secondary | ICD-10-CM

## 2014-03-11 DIAGNOSIS — I10 Essential (primary) hypertension: Secondary | ICD-10-CM

## 2014-03-11 DIAGNOSIS — Z7901 Long term (current) use of anticoagulants: Secondary | ICD-10-CM

## 2014-03-11 NOTE — Patient Instructions (Signed)
Your physician has recommended you make the following change in your medication: decrease the tenormin to 1/2 tablet in the morning ONLY.   Your physician wants you to follow-up in: 6 months or sooner if needed. You will receive a reminder letter in the mail two months in advance. If you don't receive a letter, please call our office to schedule the follow-up appointment.

## 2014-03-11 NOTE — Progress Notes (Signed)
Patient ID: SIRIAH TREAT, female   DOB: [birth year], 80 y.o.   MRN: [id number]      HPI: KHRISTINA JANOTA is a 80 y.o. female to the office today for a 5 month followup cardiology evaluation.   Ms. Grills has a history of hypertension, palpitations, mixed hyperlipidemia and was found to be in atrial fibrillation of questionable duration last summer. Her last documented sinus rhythm had been in December 2013. When she was seen in June 2014 she was in atrial fibrillation. At that time she was started on Eliquis 2.5 twice a day for anticoagulation. An echo Doppler study showed vigorous systolic function. Tissue Doppler suggested elevated filling pressures. She had moderate mitral regurgitation, moderate LA dilatation, mild RA dilatation and moderate RV dilatation. She was initially treated with Propofenone which was subsequently titrated up to 225 twice a day. On 11/12/2012 she underwent successful cardioversion with one shock of 120 J with restoration of sinus rhythm.   When I saw her back in the office in followup for cardioversion, unfortunately, she was back in atrial fibrillation with a controlled ventricular rate. At that time, she was entirely asymptomatic we decided to discontinue her antiarrhythmic therapy and maintain her in permanent atrial fibrillation.  Proprofenone was dc'd and atenolol was titrated for rate control.   Since I last saw her, she has continued to feel well.  She is taking atenolol 25 mg in the morning, HCTZ 25 mg daily.  Valsartan 40 mg daily and verapamil 240 mg at bedtime for hypertension and rate control.  She continues to be on Eliquis 2.5 mg twice a day for anticoagulation.  She denies recent bleeding.  She is on Crestor 10 mg and is taking this every other day for hyperlipidemia.  She presents for follow-up evaluation.  Past Medical History  Diagnosis Date  . Breast cancer   . Renal hypertension 03/07/2005    no evidence of significant diameter reduction, dissection,  tortuosity, FMD or orther vascular abnormality; kidneys equal in size, symmetry, with normal cortex and medulla  . Palpitations   . Mixed hyperlipidemia   . Anxiety   . Atrial fibrillation     Past Surgical History  Procedure Laterality Date  . Breast lumpectomy    . Tonsillectomy    . Cesarean section      x 3  . Doppler echocardiography  08/29/2011    EF >72%;  LV systolic fcn normal; doppler flow pattern suggestive of impaired LV relaxation; LA mildly dilated; aortic valve mildly sclerotic, no aortic stenosis  . Cardiovascular stress test  526/2011    R/Dipyridamole - EF 71%; normal myocaridal perfusion w/o evidence of ischemia or infarct; no schintigraphic evidence of inducible myocaridal ischemia; global LV function normal; EKG negative for ischemia; low risk scan   . Cardioversion N/A 11/12/2012    Procedure: CARDIOVERSION;  Surgeon: Troy Sine, MD;  Location: [facility] ENDOSCOPY;  Service: Cardiovascular;  Laterality: N/A;    Allergies  Allergen Reactions  . Adhesive [Tape]     Adhesive bandaids  . Lipitor [Atorvastatin]   . Lovaza [Omega-3-Acid Ethyl Esters]   . Other     Z-Pack  . Penicillins   . Tarka [Trandolapril-Verapamil Hcl Er]     Current Outpatient Prescriptions  Medication Sig Dispense Refill  . acetaminophen (TYLENOL) 500 MG tablet Take 1,000 mg by mouth at bedtime.     . ALPRAZolam (XANAX) 0.25 MG tablet Take 0.25 mg by mouth 3 (three) times daily as needed for sleep.     Marland Kitchen  apixaban (ELIQUIS) 2.5 MG TABS tablet Take 1 tablet (2.5 mg total) by mouth 2 (two) times daily. 60 tablet 6  . atenolol (TENORMIN) 50 MG tablet Take 25 mg by mouth every morning.    . calcium carbonate (OS-CAL) 600 MG TABS Take 600 mg by mouth 2 (two) times daily with a meal.      . DimenhyDRINATE (DRAMAMINE PO) Take by mouth at bedtime as needed.    . fish oil-omega-3 fatty acids 1000 MG capsule Take 2 g by mouth daily.     . fluticasone (FLONASE) 50 MCG/ACT nasal spray Place 1 spray into  the nose daily.     . hydrochlorothiazide (HYDRODIURIL) 25 MG tablet Take 1 tablet (25 mg total) by mouth daily. 90 tablet 1  . loratadine (CLARITIN) 10 MG tablet Take 10 mg by mouth daily.      . Multiple Vitamins-Minerals (ICAPS MV) TABS Take 1 tablet by mouth daily.    . rosuvastatin (CRESTOR) 10 MG tablet Take 1 tablet (10 mg total) by mouth daily. (Patient taking differently: Take 10 mg by mouth every other day. ) 30 tablet 10  . valsartan (DIOVAN) 40 MG tablet Take 1 tablet (40 mg total) by mouth daily. 30 tablet 8  . verapamil (VERELAN PM) 240 MG 24 hr capsule Take 1 capsule (240 mg total) by mouth at bedtime. 90 capsule 3   No current facility-administered medications for this visit.    History   Social History  . Marital Status: Widowed    Spouse Name: N/A    Number of Children: N/A  . Years of Education: N/A   Occupational History  . Not on file.   Social History Main Topics  . Smoking status: Former Smoker    Types: Cigarettes    Quit date: 04/02/1993  . Smokeless tobacco: Never Used     Comment: patient quit smoking around 1994  . Alcohol Use: Yes     Comment: 1/4 glass of wine every 2 weeks  . Drug Use: No  . Sexual Activity: Not on file   Other Topics Concern  . Not on file   Social History Narrative   She is widowed.  She quit tobacco in 1994.  She does read avidly.  Family History  Problem Relation Age of Onset  . Heart failure Mother   . Breast cancer Mother   . Hypertension Father   . Kidney cancer Father   . Stroke Maternal Grandmother    ROS General: Negative; No fevers, chills, or night sweats;  HEENT: Negative; No changes in vision or hearing, sinus congestion, difficulty swallowing Pulmonary: Negative; No cough, wheezing, shortness of breath, hemoptysis Cardiovascular: Negative; No chest pain, presyncope, syncope, palpitations GI: Negative; No nausea, vomiting, diarrhea, or abdominal pain GU: Negative; No dysuria, hematuria, or difficulty  voiding Musculoskeletal: Negative; no myalgias, joint pain, or weakness Hematologic/Oncology: Negative; no easy bruising, bleeding , on eloquent Endocrine: Negative; no heat/cold intolerance; no diabetes Neuro: Negative; no changes in balance, headaches Skin: Negative; No rashes or skin lesions Psychiatric: Negative; No behavioral problems, depression Sleep: Negative; No snoring, daytime sleepiness, hypersomnolence, bruxism, restless legs, hypnogognic hallucinations, no cataplexy Other comprehensive 14 point system review is negative.   PE BP 120/72 mmHg  Pulse 51  Ht 5\' 6"  (1.676 m)  Wt 165 lb 11.2 oz (75.161 kg)  BMI 26.76 kg/m2  General: Alert, oriented, no distress.  Skin: normal turgor, no rashes HEENT: Normocephalic, atraumatic. Pupils round and reactive; sclera anicteric;no lid lag.  Nose  without nasal septal hypertrophy Mouth/Parynx benign; Mallinpatti scale 3 Neck: No JVD, no carotid bruits with normal upstroke Lungs: clear to ausculatation and percussion; no wheezing or rales Chest wall: Nontender to palpation Heart: Irregularly irregular rhythm with a rate in the 50s in atrial fibrillation, s1 s2 normal ; 1/6 systolic murmur; no diastolic murmur.  No rubs, thrills or heaves Abdomen: soft, nontender; no hepatosplenomehaly, BS+; abdominal aorta nontender and not dilated by palpation. Back: No CVA tenderness Pulses 2+ Extremities: no clubbing cyanosis or edema, Homan's sign negative  Neurologic: grossly nonfocal Psychological: Normal affect and mood.  She is improved since several years have elapsed since her husbands death.  ECG (independently read by me): Atrial fibrillation with ventricular response in the low 50s at an average rate of 51 bpm.  Rightward axis.  No significant ST-T changes.  09/30/2013 ECG (independently read by me): Atrial fibrillation with a slow ventricular rate at 52.  Nonspecific ST changes.  QTc interval 402 ms.  November 2014 ECG: Atrial  fibrillation rate ~ 70.  Nonspecific ST-T changes; rare PVC.  LABS:  BMET    Component Value Date/Time   NA 141 05/08/2013 0950   NA 138 11/06/2012 0929   K 4.0 05/08/2013 0950   K 3.6 11/06/2012 0929   CL 103 11/06/2012 0929   CL 102 04/21/2012 0925   CO2 31* 05/08/2013 0950   CO2 31 11/06/2012 0929   GLUCOSE 110 05/08/2013 0950   GLUCOSE 97 11/06/2012 0929   GLUCOSE 125* 04/21/2012 0925   BUN 17.2 05/08/2013 0950   BUN 17 11/06/2012 0929   CREATININE 0.8 05/08/2013 0950   CREATININE 0.81 11/06/2012 0929   CREATININE 0.76 03/16/2011 1355   CALCIUM 10.7* 05/08/2013 0950   CALCIUM 9.9 11/06/2012 0929     Hepatic Function Panel     Component Value Date/Time   PROT 6.8 05/08/2013 0950   PROT 6.3 11/06/2012 0929   ALBUMIN 3.8 05/08/2013 0950   ALBUMIN 3.6 11/06/2012 0929   AST 18 05/08/2013 0950   AST 15 11/06/2012 0929   ALT 16 05/08/2013 0950   ALT 11 11/06/2012 0929   ALKPHOS 66 05/08/2013 0950   ALKPHOS 48 11/06/2012 0929   BILITOT 0.54 05/08/2013 0950   BILITOT 0.4 11/06/2012 0929     CBC    Component Value Date/Time   WBC 4.5 05/08/2013 0950   WBC 5.8 11/06/2012 0929   RBC 4.04 05/08/2013 0950   RBC 4.37 11/06/2012 0929   HGB 12.7 05/08/2013 0950   HGB 13.1 11/06/2012 0929   HCT 38.6 05/08/2013 0950   HCT 39.2 11/06/2012 0929   PLT 116* 05/08/2013 0950   PLT 140* 11/06/2012 0929   MCV 95.5 05/08/2013 0950   MCV 89.7 11/06/2012 0929   MCH 31.5 05/08/2013 0950   MCH 30.0 11/06/2012 0929   MCHC 33.0 05/08/2013 0950   MCHC 33.4 11/06/2012 0929   RDW 14.7* 05/08/2013 0950   RDW 14.9 11/06/2012 0929   LYMPHSABS 1.3 05/08/2013 0950   MONOABS 0.7 05/08/2013 0950   EOSABS 0.1 05/08/2013 0950   BASOSABS 0.1 05/08/2013 0950     BNP No results found for: PROBNP  Lipid Panel     Component Value Date/Time   CHOL 118 10/08/2012 0910   TRIG 124 10/08/2012 0910   HDL 50 10/08/2012 0910   CHOLHDL 2.4 10/08/2012 0910   VLDL 25 10/08/2012 0910    LDLCALC 43 10/08/2012 0910     RADIOLOGY: No results found.    ASSESSMENT  AND PLAN:  Ms. Milson is now in permanent atrial fibrillation with rate control.  When I last saw her, I reduced her atenolol dose from 50 mg twice a day to 50 mg in the morning and 25 mg at night and had recommended that if her resting pulse was consistently in the low 50s to further reduce this to 25 mg twice a day. With her resting pulse now still in the low 50s I will further reduce her atenolol to just 25 mg in the morning and she will continue to take  verapamil 240 mg at bedtime for rate control.  Despite being bradycardic, she remains asymptomatic and denies any presyncope or syncope.  Her blood pressure today controlled on her multiple drug regimen.  She is on chronic anticoagulation with Eliquis 2.5 mg bid and is tolerating this well without bleeding.  She tells me she had laboratory done recently by her primary physician and was told that her total cholesterol was 118 and triglycerides 115.  Last year her LDL cholesterol at a similar total cholesterol was excellent at 43 on crestor which he now takes 10 mg every other day.  There is no edema on her dose of hydrochlorothiazide.  As long as she remains stable, I will see her in 6 months for reevaluation, but will be available sooner if problems arise.  Time spent: 25 minutes   Troy Sine, MD, [facility]  03/13/2014 1:18 PM

## 2014-03-13 ENCOUNTER — Encounter: Payer: Self-pay | Admitting: Cardiovascular Disease

## 2014-03-15 ENCOUNTER — Other Ambulatory Visit: Payer: Self-pay

## 2014-03-15 MED ORDER — HYDROCHLOROTHIAZIDE 25 MG PO TABS: 25.0000 mg | ORAL_TABLET | Freq: Every day | ORAL | Status: DC

## 2014-03-15 NOTE — Telephone Encounter (Signed)
Rx sent to pharmacy   

## 2014-03-30 ENCOUNTER — Other Ambulatory Visit: Payer: Self-pay | Admitting: *Deleted

## 2014-03-30 ENCOUNTER — Telehealth: Payer: Self-pay | Admitting: Internal Medicine

## 2014-03-30 NOTE — Telephone Encounter (Signed)
pt called to r/s appt to April...done....pt aware of new d.t.Marland KitchenMarland KitchenMarland Kitchentransferred pt to radiology

## 2014-04-09 ENCOUNTER — Other Ambulatory Visit: Payer: Self-pay

## 2014-04-09 DIAGNOSIS — Z1231 Encounter for screening mammogram for malignant neoplasm of breast: Secondary | ICD-10-CM

## 2014-05-03 ENCOUNTER — Other Ambulatory Visit: Payer: Self-pay

## 2014-05-03 MED ORDER — VERAPAMIL HCL ER 240 MG PO CP24: 240.0000 mg | ORAL_CAPSULE | Freq: Every day | ORAL | Status: DC

## 2014-05-03 NOTE — Telephone Encounter (Signed)
Rx sent to pharmacy   

## 2014-05-10 ENCOUNTER — Other Ambulatory Visit: Payer: Self-pay

## 2014-05-10 DIAGNOSIS — C50919 Malignant neoplasm of unspecified site of unspecified female breast: Secondary | ICD-10-CM

## 2014-05-10 MED ORDER — ROSUVASTATIN CALCIUM 10 MG PO TABS: 10.0000 mg | ORAL_TABLET | ORAL | Status: DC

## 2014-05-10 NOTE — Telephone Encounter (Signed)
Rx(s) sent to pharmacy electronically.  

## 2014-05-11 ENCOUNTER — Ambulatory Visit (HOSPITAL_COMMUNITY): Payer: Medicare Other

## 2014-05-11 ENCOUNTER — Other Ambulatory Visit: Payer: Medicare Other

## 2014-05-18 ENCOUNTER — Ambulatory Visit: Payer: Medicare Other | Admitting: Internal Medicine

## 2014-06-17 ENCOUNTER — Ambulatory Visit
Admission: RE | Admit: 2014-06-17 | Discharge: 2014-06-17 | Disposition: A | Discharge status: Home / Self Care | Payer: Medicare Other | Source: Ambulatory Visit

## 2014-06-17 DIAGNOSIS — Z1231 Encounter for screening mammogram for malignant neoplasm of breast: Secondary | ICD-10-CM

## 2014-06-27 ENCOUNTER — Encounter (HOSPITAL_COMMUNITY): Payer: Self-pay | Admitting: Emergency Medicine

## 2014-06-27 ENCOUNTER — Emergency Department (HOSPITAL_COMMUNITY)
Admission: EM | Admit: 2014-06-27 | Discharge: 2014-06-27 | Disposition: A | Discharge status: Home / Self Care | Payer: Medicare Other | Attending: Emergency Medicine | Admitting: Emergency Medicine

## 2014-06-27 ENCOUNTER — Emergency Department (HOSPITAL_COMMUNITY): Payer: Medicare Other

## 2014-06-27 DIAGNOSIS — F419 Anxiety disorder, unspecified: Secondary | ICD-10-CM | POA: Insufficient documentation

## 2014-06-27 DIAGNOSIS — E782 Mixed hyperlipidemia: Secondary | ICD-10-CM | POA: Insufficient documentation

## 2014-06-27 DIAGNOSIS — Z79899 Other long term (current) drug therapy: Secondary | ICD-10-CM | POA: Insufficient documentation

## 2014-06-27 DIAGNOSIS — Z7902 Long term (current) use of antithrombotics/antiplatelets: Secondary | ICD-10-CM | POA: Insufficient documentation

## 2014-06-27 DIAGNOSIS — Z880 Allergy status to penicillin: Secondary | ICD-10-CM | POA: Insufficient documentation

## 2014-06-27 DIAGNOSIS — I10 Essential (primary) hypertension: Secondary | ICD-10-CM | POA: Insufficient documentation

## 2014-06-27 DIAGNOSIS — M545 Low back pain: Secondary | ICD-10-CM | POA: Insufficient documentation

## 2014-06-27 DIAGNOSIS — I4891 Unspecified atrial fibrillation: Secondary | ICD-10-CM | POA: Insufficient documentation

## 2014-06-27 DIAGNOSIS — Z87891 Personal history of nicotine dependence: Secondary | ICD-10-CM | POA: Insufficient documentation

## 2014-06-27 DIAGNOSIS — Z853 Personal history of malignant neoplasm of breast: Secondary | ICD-10-CM | POA: Insufficient documentation

## 2014-06-27 LAB — URINALYSIS, ROUTINE W REFLEX MICROSCOPIC
Bilirubin Urine: NEGATIVE
Glucose, UA: NEGATIVE mg/dL
HGB URINE DIPSTICK: NEGATIVE
Ketones, ur: NEGATIVE mg/dL
Leukocytes, UA: NEGATIVE
Nitrite: NEGATIVE
Protein, ur: NEGATIVE mg/dL
SPECIFIC GRAVITY, URINE: 1.02 (ref 1.005–1.030)
Urobilinogen, UA: 0.2 mg/dL (ref 0.0–1.0)
pH: 8 (ref 5.0–8.0)

## 2014-06-27 LAB — CBC
HEMATOCRIT: 42 % (ref 36.0–46.0)
Hemoglobin: 13.8 g/dL (ref 12.0–15.0)
MCH: 31 pg (ref 26.0–34.0)
MCHC: 32.9 g/dL (ref 30.0–36.0)
MCV: 94.4 fL (ref 78.0–100.0)
PLATELETS: 126 10*3/uL — AB (ref 150–400)
RBC: 4.45 MIL/uL (ref 3.87–5.11)
RDW: 14.9 % (ref 11.5–15.5)
WBC: 9.6 10*3/uL (ref 4.0–10.5)

## 2014-06-27 LAB — COMPREHENSIVE METABOLIC PANEL
ALK PHOS: 77 U/L (ref 39–117)
ALT: 15 U/L (ref 0–35)
AST: 25 U/L (ref 0–37)
Albumin: 4 g/dL (ref 3.5–5.2)
Anion gap: 11 (ref 5–15)
BUN: 17 mg/dL (ref 6–23)
CO2: 28 mmol/L (ref 19–32)
Calcium: 9.8 mg/dL (ref 8.4–10.5)
Chloride: 94 mmol/L — ABNORMAL LOW (ref 96–112)
Creatinine, Ser: 0.68 mg/dL (ref 0.50–1.10)
GFR calc Af Amer: 90 mL/min — ABNORMAL LOW (ref >90)
GFR, EST NON AFRICAN AMERICAN: 78 mL/min — AB (ref >90)
GLUCOSE: 122 mg/dL — AB (ref 70–99)
POTASSIUM: 3.2 mmol/L — AB (ref 3.5–5.1)
Sodium: 133 mmol/L — ABNORMAL LOW (ref 135–145)
Total Bilirubin: 0.8 mg/dL (ref 0.3–1.2)
Total Protein: 7.5 g/dL (ref 6.0–8.3)

## 2014-06-27 MED ORDER — METOPROLOL TARTRATE 1 MG/ML IV SOLN
5.0000 mg | Freq: Once | INTRAVENOUS | Status: AC
  Administered 2014-06-27: 5 mg via INTRAVENOUS
  Filled 2014-06-27: qty 5

## 2014-06-27 NOTE — Discharge Instructions (Signed)
Increase your atenolol to 50 mg in the morning and 25 mg at night  Hypertension Hypertension, commonly called high blood pressure, is when the force of blood pumping through your arteries is too strong. Your arteries are the blood vessels that carry blood from your heart throughout your body. A blood pressure reading consists of a higher number over a lower number, such as 110/72. The higher number (systolic) is the pressure inside your arteries when your heart pumps. The lower number (diastolic) is the pressure inside your arteries when your heart relaxes. Ideally you want your blood pressure below 120/80. Hypertension forces your heart to work harder to pump blood. Your arteries may become narrow or stiff. Having hypertension puts you at risk for heart disease, stroke, and other problems.  RISK FACTORS Some risk factors for high blood pressure are controllable. Others are not.  Risk factors you cannot control include:   Race. You may be at higher risk if you are African American.  Age. Risk increases with age.  Gender. Men are at higher risk than women before age 74 years. After age 2, women are at higher risk than men. Risk factors you can control include:  Not getting enough exercise or physical activity.  Being overweight.  Getting too much fat, sugar, calories, or salt in your diet.  Drinking too much alcohol. SIGNS AND SYMPTOMS Hypertension does not usually cause signs or symptoms. Extremely high blood pressure (hypertensive crisis) may cause headache, anxiety, shortness of breath, and nosebleed. DIAGNOSIS  To check if you have hypertension, your health care provider will measure your blood pressure while you are seated, with your arm held at the level of your heart. It should be measured at least twice using the same arm. Certain conditions can cause a difference in blood pressure between your right and left arms. A blood pressure reading that is higher than normal on one  occasion does not mean that you need treatment. If one blood pressure reading is high, ask your health care provider about having it checked again. TREATMENT  Treating high blood pressure includes making lifestyle changes and possibly taking medicine. Living a healthy lifestyle can help lower high blood pressure. You may need to change some of your habits. Lifestyle changes may include:  Following the DASH diet. This diet is high in fruits, vegetables, and whole grains. It is low in salt, red meat, and added sugars.  Getting at least 2 hours of brisk physical activity every week.  Losing weight if necessary.  Not smoking.  Limiting alcoholic beverages.  Learning ways to reduce stress. If lifestyle changes are not enough to get your blood pressure under control, your health care provider may prescribe medicine. You may need to take more than one. Work closely with your health care provider to understand the risks and benefits. HOME CARE INSTRUCTIONS  Have your blood pressure rechecked as directed by your health care provider.   Take medicines only as directed by your health care provider. Follow the directions carefully. Blood pressure medicines must be taken as prescribed. The medicine does not work as well when you skip doses. Skipping doses also puts you at risk for problems.   Do not smoke.   Monitor your blood pressure at home as directed by your health care provider. SEEK MEDICAL CARE IF:   You think you are having a reaction to medicines taken.  You have recurrent headaches or feel dizzy.  You have swelling in your ankles.  You have trouble with  your vision. SEEK IMMEDIATE MEDICAL CARE IF:  You develop a severe headache or confusion.  You have unusual weakness, numbness, or feel faint.  You have severe chest or abdominal pain.  You vomit repeatedly.  You have trouble breathing. MAKE SURE YOU:   Understand these instructions.  Will watch your  condition.  Will get help right away if you are not doing well or get worse. Document Released: 03/19/2005 Document Revised: 08/03/2013 Document Reviewed: 01/09/2013 [facility] Patient Information 2015 [place], [place]. This information is not intended to replace advice given to you by your health care provider. Make sure you discuss any questions you have with your health care provider.

## 2014-06-27 NOTE — ED Notes (Signed)
Patient transported to MRI 

## 2014-06-27 NOTE — ED Provider Notes (Signed)
CSN: [id number]     Arrival date & time 06/27/14  1239 History   First MD Initiated Contact with Patient 06/27/14 1330     Chief Complaint  Patient presents with  . Altered Mental Status  . Flank Pain      HPI Pt BIB daughters for mental status change. States that she was last physically seen last week and was talked to via phone yesterday. Pt's daughter attempted to call her this morning and did not answer because she could not remember how to work her phone. Pt is A&O x 4. Pt also c/o bilateral flank pain since yesterday. Denies NVD.  Past Medical History  Diagnosis Date  . Breast cancer   . Renal hypertension 03/07/2005    no evidence of significant diameter reduction, dissection, tortuosity, FMD or orther vascular abnormality; kidneys equal in size, symmetry, with normal cortex and medulla  . Palpitations   . Mixed hyperlipidemia   . Anxiety   . Atrial fibrillation    Past Surgical History  Procedure Laterality Date  . Breast lumpectomy    . Tonsillectomy    . Cesarean section      x 3  . Doppler echocardiography  08/29/2011    EF >44%;  LV systolic fcn normal; doppler flow pattern suggestive of impaired LV relaxation; LA mildly dilated; aortic valve mildly sclerotic, no aortic stenosis  . Cardiovascular stress test  526/2011    R/Dipyridamole - EF 71%; normal myocaridal perfusion w/o evidence of ischemia or infarct; no schintigraphic evidence of inducible myocaridal ischemia; global LV function normal; EKG negative for ischemia; low risk scan   . Cardioversion N/A 11/12/2012    Procedure: CARDIOVERSION;  Surgeon: Troy Sine, MD;  Location: [facility] ENDOSCOPY;  Service: Cardiovascular;  Laterality: N/A;  . Breast lumpectomy  1998    left side   Family History  Problem Relation Age of Onset  . Heart failure Mother   . Breast cancer Mother   . Hypertension Father   . Kidney cancer Father   . Stroke Maternal Grandmother    History  Substance Use Topics  . Smoking  status: Former Smoker    Types: Cigarettes    Quit date: 04/02/1993  . Smokeless tobacco: Never Used     Comment: patient quit smoking around 1994  . Alcohol Use: Yes     Comment: 1/4 glass of wine every 2 weeks   OB History    No data available     Review of Systems  All other systems reviewed and are negative.     Allergies  Adhesive; Lipitor; Lovaza; Other; Penicillins; and Tarka  Home Medications   Prior to Admission medications   Medication Sig Start Date End Date Taking? Authorizing Provider  acetaminophen (TYLENOL) 500 MG tablet Take 1,000 mg by mouth at bedtime.    Yes Historical Provider, MD  ALPRAZolam (XANAX) 0.25 MG tablet Take 0.125-0.25 mg by mouth 3 (three) times daily as needed for anxiety.    Yes Historical Provider, MD  apixaban (ELIQUIS) 2.5 MG TABS tablet Take 1 tablet (2.5 mg total) by mouth 2 (two) times daily. 12/23/13  Yes Troy Sine, MD  calcium carbonate (OS-CAL) 600 MG TABS Take 600 mg by mouth 2 (two) times daily with a meal.     Yes Historical Provider, MD  fluticasone (FLONASE) 50 MCG/ACT nasal spray Place 1 spray into both nostrils at bedtime.  07/04/12  Yes Historical Provider, MD  hydrochlorothiazide (HYDRODIURIL) 25 MG tablet Take 1 tablet (25  mg total) by mouth daily. Patient taking differently: Take 25 mg by mouth 2 (two) times daily.  03/15/14  Yes Troy Sine, MD  loratadine (CLARITIN) 10 MG tablet Take 10 mg by mouth daily.     Yes Historical Provider, MD  Meclizine HCl (BONINE PO) Take 1 tablet by mouth daily as needed (dizziness).   Yes Historical Provider, MD  Multiple Vitamins-Minerals (ICAPS MV) TABS Take 3-5 tablets by mouth daily. Takes three in the morning and five tablets at night.   Yes Historical Provider, MD  rosuvastatin (CRESTOR) 10 MG tablet Take 1 tablet (10 mg total) by mouth every other day. 05/10/14  Yes Troy Sine, MD  valsartan (DIOVAN) 40 MG tablet Take 1 tablet (40 mg total) by mouth daily. 02/15/14  Yes Troy Sine, MD  verapamil (VERELAN PM) 240 MG 24 hr capsule Take 1 capsule (240 mg total) by mouth at bedtime. 05/03/14  Yes Troy Sine, MD  atenolol (TENORMIN) 50 MG tablet Take 25-50 mg by mouth 2 (two) times daily. Takes one tablet in the morning and half a tablet at night.    Historical Provider, MD   BP 157/76 mmHg  Pulse 96  Temp(Src) 99.9 F (37.7 C) (Oral)  Resp 20  SpO2 97% Physical Exam  Constitutional: She is oriented to person, place, and time. She appears well-developed and well-nourished. No distress.  HENT:  Head: Normocephalic and atraumatic.  Eyes: Pupils are equal, round, and reactive to light.  Neck: Normal range of motion.  Cardiovascular: Normal rate and intact distal pulses.   Pulmonary/Chest: No respiratory distress.  Abdominal: Normal appearance. She exhibits no distension. There is no tenderness.  Musculoskeletal: Normal range of motion.       Back:  Neurological: She is alert and oriented to person, place, and time. No cranial nerve deficit.  Skin: Skin is warm and dry. No rash noted.  Psychiatric: She has a normal mood and affect. Her behavior is normal.  Nursing note and vitals reviewed.   ED Course  Procedures (including critical care time) Labs Review Labs Reviewed  CBC - Abnormal; Notable for the following:    Platelets 126 (*)    All other components within normal limits  COMPREHENSIVE METABOLIC PANEL - Abnormal; Notable for the following:    Sodium 133 (*)    Potassium 3.2 (*)    Chloride 94 (*)    Glucose, Bld 122 (*)    GFR calc non Af Amer 78 (*)    GFR calc Af Amer 90 (*)    All other components within normal limits  URINALYSIS, ROUTINE W REFLEX MICROSCOPIC    Imaging Review Mr Herby Abraham Contrast  06/27/2014   CLINICAL DATA:  81 year old female with altered mental status today, recent flank pain. Initial encounter.  EXAM: MRI HEAD WITHOUT CONTRAST  TECHNIQUE: Multiplanar, multiecho pulse sequences of the brain and surrounding structures  were obtained without intravenous contrast.  COMPARISON:  Neck CT 11/03/2007.  FINDINGS: Cerebral volume is within normal limits for age. No restricted diffusion to suggest acute infarction. No midline shift, mass effect, evidence of mass lesion, ventriculomegaly, extra-axial collection or acute intracranial hemorrhage. Cervicomedullary junction and pituitary are within normal limits. Negative visualized cervical spine. Major intracranial vascular flow voids are within normal limits.  Small chronic lacunar infarcts in the inferior right cerebellum. Small probable perivascular spaces at the junction of the midbrain and pons, less likely small chronic lacunae. Deep gray matter nuclei similarly are within normal limits  for age. Minimal to mild for age nonspecific cerebral white matter T2 and FLAIR hyperintensity. No cortical encephalomalacia. No chronic blood products identified in the brain.  Visible internal auditory structures appear normal. Mastoids are clear. Trace paranasal sinus mucosal thickening. Postoperative changes to the globes. Visualized scalp soft tissues are within normal limits. Incidental small right nasopharyngeal retention cyst (series 6, image 5). Normal bone marrow signal.  IMPRESSION: 1.  No acute intracranial abnormality. 2. Mild for age chronic small vessel disease.   Electronically Signed   By: Genevie Ann M.D.   On: 06/27/2014 14:44     EKG Interpretation   Date/Time:  Sunday June 27 2014 13:12:48 EDT Ventricular Rate:  95 PR Interval:    QRS Duration: 98 QT Interval:  377 QTC Calculation: 474 R Axis:   108 Text Interpretation:  Atrial fibrillation Right axis deviation Probable  anteroseptal infarct, old Borderline repolarization abnormality compared  to prior, a fib has replaced sinus rhythm Confirmed by [facility]  MD, TREY  ([id number]) on 06/27/2014 1:17:06 PM     After treatment in the ED the patient feels back to baseline and wants to go home. MDM   Final diagnoses:    Essential hypertension        Leonard Schwartz, MD 06/27/14 1550

## 2014-06-27 NOTE — ED Notes (Signed)
Pt BIB daughters for mental status change.  States that she was last physically seen last week and was talked to via phone yesterday.  Pt's daughter attempted to call her this morning and did not answer because she could not remember how to work her phone.  Pt is A&O x 4.  Pt also c/o bilateral flank pain since yesterday.  Denies NVD.

## 2014-06-28 ENCOUNTER — Inpatient Hospital Stay (HOSPITAL_COMMUNITY)
Admission: EM | Admit: 2014-06-28 | Discharge: 2014-07-01 | DRG: 193 | Disposition: A | Discharge status: Skilled Nursing Facility | Payer: Medicare Other | Attending: Internal Medicine | Admitting: Internal Medicine

## 2014-06-28 ENCOUNTER — Emergency Department (HOSPITAL_COMMUNITY): Payer: Medicare Other

## 2014-06-28 ENCOUNTER — Encounter (HOSPITAL_COMMUNITY): Payer: Self-pay | Admitting: *Deleted

## 2014-06-28 DIAGNOSIS — Z79899 Other long term (current) drug therapy: Secondary | ICD-10-CM | POA: Diagnosis not present

## 2014-06-28 DIAGNOSIS — R4182 Altered mental status, unspecified: Secondary | ICD-10-CM | POA: Diagnosis present

## 2014-06-28 DIAGNOSIS — Z7901 Long term (current) use of anticoagulants: Secondary | ICD-10-CM | POA: Diagnosis not present

## 2014-06-28 DIAGNOSIS — Z853 Personal history of malignant neoplasm of breast: Secondary | ICD-10-CM | POA: Diagnosis not present

## 2014-06-28 DIAGNOSIS — J9601 Acute respiratory failure with hypoxia: Secondary | ICD-10-CM | POA: Diagnosis not present

## 2014-06-28 DIAGNOSIS — B954 Other streptococcus as the cause of diseases classified elsewhere: Secondary | ICD-10-CM | POA: Diagnosis present

## 2014-06-28 DIAGNOSIS — Z803 Family history of malignant neoplasm of breast: Secondary | ICD-10-CM

## 2014-06-28 DIAGNOSIS — Z823 Family history of stroke: Secondary | ICD-10-CM | POA: Diagnosis not present

## 2014-06-28 DIAGNOSIS — Z887 Allergy status to serum and vaccine status: Secondary | ICD-10-CM | POA: Diagnosis not present

## 2014-06-28 DIAGNOSIS — E782 Mixed hyperlipidemia: Secondary | ICD-10-CM | POA: Diagnosis present

## 2014-06-28 DIAGNOSIS — J189 Pneumonia, unspecified organism: Secondary | ICD-10-CM | POA: Diagnosis present

## 2014-06-28 DIAGNOSIS — Z87891 Personal history of nicotine dependence: Secondary | ICD-10-CM | POA: Diagnosis not present

## 2014-06-28 DIAGNOSIS — I4891 Unspecified atrial fibrillation: Secondary | ICD-10-CM | POA: Diagnosis present

## 2014-06-28 DIAGNOSIS — Z881 Allergy status to other antibiotic agents status: Secondary | ICD-10-CM

## 2014-06-28 DIAGNOSIS — Z888 Allergy status to other drugs, medicaments and biological substances status: Secondary | ICD-10-CM

## 2014-06-28 DIAGNOSIS — E876 Hypokalemia: Secondary | ICD-10-CM | POA: Diagnosis present

## 2014-06-28 DIAGNOSIS — I10 Essential (primary) hypertension: Secondary | ICD-10-CM | POA: Diagnosis not present

## 2014-06-28 DIAGNOSIS — T502X5A Adverse effect of carbonic-anhydrase inhibitors, benzothiadiazides and other diuretics, initial encounter: Secondary | ICD-10-CM | POA: Diagnosis present

## 2014-06-28 DIAGNOSIS — Z8249 Family history of ischemic heart disease and other diseases of the circulatory system: Secondary | ICD-10-CM

## 2014-06-28 DIAGNOSIS — B9620 Unspecified Escherichia coli [E. coli] as the cause of diseases classified elsewhere: Secondary | ICD-10-CM | POA: Diagnosis present

## 2014-06-28 DIAGNOSIS — D6959 Other secondary thrombocytopenia: Secondary | ICD-10-CM | POA: Diagnosis present

## 2014-06-28 DIAGNOSIS — Z66 Do not resuscitate: Secondary | ICD-10-CM | POA: Diagnosis present

## 2014-06-28 DIAGNOSIS — I129 Hypertensive chronic kidney disease with stage 1 through stage 4 chronic kidney disease, or unspecified chronic kidney disease: Secondary | ICD-10-CM | POA: Diagnosis present

## 2014-06-28 DIAGNOSIS — R531 Weakness: Secondary | ICD-10-CM

## 2014-06-28 DIAGNOSIS — Z8051 Family history of malignant neoplasm of kidney: Secondary | ICD-10-CM | POA: Diagnosis not present

## 2014-06-28 DIAGNOSIS — I482 Chronic atrial fibrillation: Secondary | ICD-10-CM | POA: Diagnosis not present

## 2014-06-28 DIAGNOSIS — Z880 Allergy status to penicillin: Secondary | ICD-10-CM

## 2014-06-28 DIAGNOSIS — W19XXXA Unspecified fall, initial encounter: Secondary | ICD-10-CM

## 2014-06-28 DIAGNOSIS — N189 Chronic kidney disease, unspecified: Secondary | ICD-10-CM | POA: Diagnosis present

## 2014-06-28 DIAGNOSIS — F0390 Unspecified dementia without behavioral disturbance: Secondary | ICD-10-CM | POA: Diagnosis present

## 2014-06-28 DIAGNOSIS — F419 Anxiety disorder, unspecified: Secondary | ICD-10-CM | POA: Diagnosis present

## 2014-06-28 DIAGNOSIS — R7881 Bacteremia: Secondary | ICD-10-CM | POA: Diagnosis not present

## 2014-06-28 HISTORY — DX: Prediabetes: R73.03

## 2014-06-28 LAB — COMPREHENSIVE METABOLIC PANEL
ALT: 17 U/L (ref 0–35)
AST: 35 U/L (ref 0–37)
Albumin: 3.8 g/dL (ref 3.5–5.2)
Alkaline Phosphatase: 64 U/L (ref 39–117)
Anion gap: 12 (ref 5–15)
BUN: 21 mg/dL (ref 6–23)
CALCIUM: 10.2 mg/dL (ref 8.4–10.5)
CO2: 28 mmol/L (ref 19–32)
Chloride: 94 mmol/L — ABNORMAL LOW (ref 96–112)
Creatinine, Ser: 0.61 mg/dL (ref 0.50–1.10)
GFR, EST NON AFRICAN AMERICAN: 80 mL/min — AB (ref >90)
GLUCOSE: 109 mg/dL — AB (ref 70–99)
Potassium: 3 mmol/L — ABNORMAL LOW (ref 3.5–5.1)
Sodium: 134 mmol/L — ABNORMAL LOW (ref 135–145)
Total Bilirubin: 1.1 mg/dL (ref 0.3–1.2)
Total Protein: 7.3 g/dL (ref 6.0–8.3)

## 2014-06-28 LAB — CBC WITH DIFFERENTIAL/PLATELET
BASOS ABS: 0 10*3/uL (ref 0.0–0.1)
BASOS PCT: 0 % (ref 0–1)
EOS PCT: 0 % (ref 0–5)
Eosinophils Absolute: 0 10*3/uL (ref 0.0–0.7)
HEMATOCRIT: 39.8 % (ref 36.0–46.0)
HEMOGLOBIN: 13.1 g/dL (ref 12.0–15.0)
LYMPHS ABS: 0.8 10*3/uL (ref 0.7–4.0)
LYMPHS PCT: 8 % — AB (ref 12–46)
MCH: 30.6 pg (ref 26.0–34.0)
MCHC: 32.9 g/dL (ref 30.0–36.0)
MCV: 93 fL (ref 78.0–100.0)
MONO ABS: 0.9 10*3/uL (ref 0.1–1.0)
Monocytes Relative: 9 % (ref 3–12)
Neutro Abs: 8.3 10*3/uL — ABNORMAL HIGH (ref 1.7–7.7)
Neutrophils Relative %: 83 % — ABNORMAL HIGH (ref 43–77)
Platelets: 134 10*3/uL — ABNORMAL LOW (ref 150–400)
RBC: 4.28 MIL/uL (ref 3.87–5.11)
RDW: 15.1 % (ref 11.5–15.5)
WBC: 10.1 10*3/uL (ref 4.0–10.5)

## 2014-06-28 LAB — PROTIME-INR
INR: 1.27 (ref 0.00–1.49)
Prothrombin Time: 16 seconds — ABNORMAL HIGH (ref 11.6–15.2)

## 2014-06-28 MED ORDER — OCUVITE-LUTEIN PO CAPS
1.0000 | ORAL_CAPSULE | Freq: Every day | ORAL | Status: DC
  Administered 2014-06-29 – 2014-07-01 (×3): 1 via ORAL
  Filled 2014-06-28 (×4): qty 1

## 2014-06-28 MED ORDER — SODIUM CHLORIDE 0.9 % IV SOLN
Freq: Once | INTRAVENOUS | Status: AC
  Administered 2014-06-28: 13:00:00 via INTRAVENOUS

## 2014-06-28 MED ORDER — ENSURE ENLIVE PO LIQD
237.0000 mL | Freq: Two times a day (BID) | ORAL | Status: DC
  Administered 2014-06-29 – 2014-07-01 (×5): 237 mL via ORAL

## 2014-06-28 MED ORDER — ICAPS MV PO TABS: 3.0000 | ORAL_TABLET | Freq: Every day | ORAL | Status: DC

## 2014-06-28 MED ORDER — LORATADINE 10 MG PO TABS
10.0000 mg | ORAL_TABLET | Freq: Every day | ORAL | Status: DC
  Administered 2014-06-29 – 2014-07-01 (×3): 10 mg via ORAL
  Filled 2014-06-28 (×3): qty 1

## 2014-06-28 MED ORDER — CALCIUM CARBONATE ANTACID 500 MG PO CHEW
500.0000 mg | CHEWABLE_TABLET | Freq: Two times a day (BID) | ORAL | Status: DC
  Administered 2014-06-28 – 2014-06-29 (×2): 500 mg via ORAL
  Filled 2014-06-28 (×2): qty 1

## 2014-06-28 MED ORDER — ROSUVASTATIN CALCIUM 10 MG PO TABS
10.0000 mg | ORAL_TABLET | ORAL | Status: DC
  Administered 2014-06-29 – 2014-07-01 (×2): 10 mg via ORAL
  Filled 2014-06-28 (×3): qty 1

## 2014-06-28 MED ORDER — ATENOLOL 50 MG PO TABS
50.0000 mg | ORAL_TABLET | Freq: Every day | ORAL | Status: DC
  Administered 2014-06-29: 50 mg via ORAL
  Filled 2014-06-28: qty 1

## 2014-06-28 MED ORDER — POTASSIUM CHLORIDE CRYS ER 20 MEQ PO TBCR
40.0000 meq | EXTENDED_RELEASE_TABLET | ORAL | Status: AC
  Administered 2014-06-28 (×2): 40 meq via ORAL
  Filled 2014-06-28 (×2): qty 2

## 2014-06-28 MED ORDER — IRBESARTAN 75 MG PO TABS
37.5000 mg | ORAL_TABLET | Freq: Every day | ORAL | Status: DC
  Administered 2014-06-29 – 2014-07-01 (×3): 37.5 mg via ORAL
  Filled 2014-06-28 (×3): qty 0.5

## 2014-06-28 MED ORDER — ALPRAZOLAM 0.25 MG PO TABS
0.1250 mg | ORAL_TABLET | Freq: Three times a day (TID) | ORAL | Status: DC | PRN
  Administered 2014-06-29: 0.25 mg via ORAL
  Filled 2014-06-28: qty 1

## 2014-06-28 MED ORDER — VERAPAMIL HCL ER 240 MG PO TBCR
240.0000 mg | EXTENDED_RELEASE_TABLET | Freq: Every day | ORAL | Status: DC
  Administered 2014-06-28 – 2014-06-30 (×3): 240 mg via ORAL
  Filled 2014-06-28 (×4): qty 1

## 2014-06-28 MED ORDER — LEVOFLOXACIN IN D5W 500 MG/100ML IV SOLN
500.0000 mg | Freq: Once | INTRAVENOUS | Status: DC
  Administered 2014-06-28: 500 mg via INTRAVENOUS
  Filled 2014-06-28: qty 100

## 2014-06-28 MED ORDER — ATENOLOL 25 MG PO TABS: 25.0000 mg | ORAL_TABLET | Freq: Two times a day (BID) | ORAL | Status: DC

## 2014-06-28 MED ORDER — ATENOLOL 25 MG PO TABS
25.0000 mg | ORAL_TABLET | Freq: Every day | ORAL | Status: DC
  Administered 2014-06-28 – 2014-06-30 (×3): 25 mg via ORAL
  Filled 2014-06-28 (×3): qty 1

## 2014-06-28 MED ORDER — APIXABAN 2.5 MG PO TABS
2.5000 mg | ORAL_TABLET | Freq: Two times a day (BID) | ORAL | Status: DC
  Administered 2014-06-28 – 2014-07-01 (×6): 2.5 mg via ORAL
  Filled 2014-06-28 (×6): qty 1

## 2014-06-28 MED ORDER — FLUTICASONE PROPIONATE 50 MCG/ACT NA SUSP
1.0000 | Freq: Every day | NASAL | Status: DC
  Administered 2014-06-28 – 2014-06-30 (×3): 1 via NASAL
  Filled 2014-06-28: qty 16

## 2014-06-28 MED ORDER — LEVOFLOXACIN IN D5W 750 MG/150ML IV SOLN: 750.0000 mg | INTRAVENOUS | Status: DC

## 2014-06-28 NOTE — ED Notes (Signed)
Bed: WA04 Expected date:  Expected time:  Means of arrival:  Comments: EMS elderly

## 2014-06-28 NOTE — ED Notes (Signed)
Patient transported to CT 

## 2014-06-28 NOTE — H&P (Addendum)
History and Physical:    Alexandra FREIMAN   DXI:[id number] DOB: [birth year] DOA: 06/28/2014  Referring physician: Dr. Roderic Palau PCP: Lilian Coma, MD   Chief Complaint: Fall, AMS, unable to care for herself at home.  History of Present Illness:   Alexandra Luna is an 81 y.o. female with a PMH of mild dementia and atrial fib on chronic Eliquis who was brought in for evaluation 06/27/14 for workup of mental status changes. Her family noted that she was weak and had reported some bilateral flank pain. Urinalysis done on that visit was negative for nitrites and leukocytes and an MRI of her brain was normal so she was discharged home after being given some IV fluids. The family went to check on her today, and found her on the floor unable to get up.  The family reports that she lives by herself, and that the family found her in between her bed and the chair next to her bed.  Her pants were down, as if she'd tried to get back into bed from using the bathroom, but the patient does not recall what happened or when she fell.  When family found her, she was awake but disoriented, and in an anxious state.  Denied pain.  Family was able to sit her up, but could not get her to stand, so EMS was called and she was brought to the ER for further evaluation.  The patient's daughter reports she was incontinent of urine last evening.  ROS:   Constitutional: No fever, no chills;  Appetite diminished; + possible weight loss, no weight gain, + fatigue.  HEENT: No blurry vision, no diplopia, no pharyngitis, no dysphagia CV: No chest pain, no palpitations, no PND, no orthopnea, no edema.  Resp: + chronic SOB on exertion, no cough, no pleuritic pain. GI: No nausea, no vomiting, no diarrhea, no melena, no hematochezia, no constipation, no abdominal pain.  GU: No dysuria, no hematuria, no frequency, no urgency, + urinary incontinence. MSK: no myalgias, + chronic left hip arthralgias.  Neuro:  No headache, no focal neurological  deficits, no history of seizures.  Psych: No depression, no anxiety.  Endo: No heat intolerance, no cold intolerance, no polyuria, no polydipsia  Skin: No rashes, no skin lesions.  Heme: No easy bruising.  Travel history: No recent travel.   Past Medical History:   Past Medical History  Diagnosis Date  . Renal hypertension 03/07/2005    no evidence of significant diameter reduction, dissection, tortuosity, FMD or orther vascular abnormality; kidneys equal in size, symmetry, with normal cortex and medulla  . Palpitations   . Mixed hyperlipidemia   . Anxiety   . Atrial fibrillation     On chronic Eliquis  . Breast cancer dx'd 1997    left; lt lumpectomy and XRT  . Pre-diabetes     Past Surgical History:   Past Surgical History  Procedure Laterality Date  . Breast lumpectomy    . Tonsillectomy    . Cesarean section      x 3  . Doppler echocardiography  08/29/2011    EF >76%;  LV systolic fcn normal; doppler flow pattern suggestive of impaired LV relaxation; LA mildly dilated; aortic valve mildly sclerotic, no aortic stenosis  . Cardiovascular stress test  526/2011    R/Dipyridamole - EF 71%; normal myocaridal perfusion w/o evidence of ischemia or infarct; no schintigraphic evidence of inducible myocaridal ischemia; global LV function normal; EKG negative for ischemia; low risk scan   .  Cardioversion N/A 11/12/2012    Procedure: CARDIOVERSION;  Surgeon: Troy Sine, MD;  Location: [facility] ENDOSCOPY;  Service: Cardiovascular;  Laterality: N/A;  . Breast lumpectomy  1998    left side    Social History:   History   Social History  . Marital Status: Widowed    Spouse Name: N/A  . Number of Children: 3  . Years of Education: N/A   Occupational History  . Retired Pharmacist, hospital    Social History Main Topics  . Smoking status: Former Smoker    Types: Cigarettes    Quit date: 04/02/1993  . Smokeless tobacco: Never Used     Comment: patient quit smoking around 1994  . Alcohol Use: Yes       Comment: 1/4 glass of wine every 2 weeks  . Drug Use: No  . Sexual Activity: Not on file   Other Topics Concern  . Not on file   Social History Narrative   Widowed.  Lives alone.  Does not have any home health services (hasn't wanted anyone in the house).  Ambulates with a cane or walker.    Family history:   Family History  Problem Relation Age of Onset  . Heart failure Mother   . Breast cancer Mother   . Hypertension Father   . Kidney cancer Father   . Stroke Maternal Grandmother     Allergies   Influenza vaccines; [place]; Adhesive; Azithromycin; Lipitor; and Penicillins  Current Medications:   Prior to Admission medications   Medication Sig Start Date End Date Taking? Authorizing Provider  acetaminophen (TYLENOL) 500 MG tablet Take 1,000 mg by mouth at bedtime.    Yes Historical Provider, MD  ALPRAZolam (XANAX) 0.25 MG tablet Take 0.125-0.25 mg by mouth 3 (three) times daily as needed for anxiety.    Yes Historical Provider, MD  apixaban (ELIQUIS) 2.5 MG TABS tablet Take 1 tablet (2.5 mg total) by mouth 2 (two) times daily. 12/23/13  Yes Troy Sine, MD  atenolol (TENORMIN) 50 MG tablet Take 25-50 mg by mouth 2 (two) times daily.    Yes Historical Provider, MD  calcium carbonate (OS-CAL) 600 MG TABS Take 600 mg by mouth 2 (two) times daily with a meal.     Yes Historical Provider, MD  fluticasone (FLONASE) 50 MCG/ACT nasal spray Place 1 spray into both nostrils at bedtime.  07/04/12  Yes Historical Provider, MD  hydrochlorothiazide (HYDRODIURIL) 25 MG tablet Take 1 tablet (25 mg total) by mouth daily. 03/15/14  Yes Troy Sine, MD  loratadine (CLARITIN) 10 MG tablet Take 10 mg by mouth daily.     Yes Historical Provider, MD  Meclizine HCl (BONINE PO) Take 0.5-1 tablets by mouth daily as needed (dizziness).    Yes Historical Provider, MD  Multiple Vitamins-Minerals (ICAPS MV) TABS Take 3-5 tablets by mouth daily.    Yes Historical Provider, MD  rosuvastatin (CRESTOR)  10 MG tablet Take 1 tablet (10 mg total) by mouth every other day. 05/10/14  Yes Troy Sine, MD  valsartan (DIOVAN) 40 MG tablet Take 1 tablet (40 mg total) by mouth daily. 02/15/14  Yes Troy Sine, MD  verapamil (VERELAN PM) 240 MG 24 hr capsule Take 1 capsule (240 mg total) by mouth at bedtime. 05/03/14  Yes Troy Sine, MD    Physical Exam:   Filed Vitals:   06/28/14 1042 06/28/14 1353 06/28/14 1525 06/28/14 1547  BP: 139/51 130/61 106/64 142/60  Pulse: 67 76 90 71  Temp:  98.2 F (36.8 C)  98 F (36.7 C) 98.6 F (37 C)  TempSrc:   Oral Oral  Resp: 16 18 20 20   Height:    5\' 6"  (1.676 m)  Weight:    78.109 kg (172 lb 3.2 oz)  SpO2: 98% 99% 99% 99%     Physical Exam: Blood pressure 142/60, pulse 71, temperature 98.6 F (37 C), temperature source Oral, resp. rate 20, height 5\' 6"  (1.676 m), weight 78.109 kg (172 lb 3.2 oz), SpO2 99 %. Gen: No acute distress.  Weak.   Head: Normocephalic, slight swelling left parietal area. Eyes: PERRL, EOMI, sclerae nonicteric. Lens implants noted bilaterally. Mouth: Oropharynx Neck: Supple, no thyromegaly, no lymphadenopathy, no jugular venous distention. Chest: Lungs diminished. CV: Heart sounds are irregularly irregular. Abdomen: Soft, nontender, nondistended with normal active bowel sounds. Extremities: Extremities are without C/E/C.  Tenderness right hip. Skin: Warm and dry. Neuro: Alert and oriented times 2; cranial nerves II through XII grossly intact. Psych: Mood and affect normal.   Data Review:    Labs: Basic Metabolic Panel:  Recent Labs Lab 06/27/14 1341 06/28/14 1230  NA 133* 134*  K 3.2* 3.0*  CL 94* 94*  CO2 28 28  GLUCOSE 122* 109*  BUN 17 21  CREATININE 0.68 0.61  CALCIUM 9.8 10.2   Liver Function Tests:  Recent Labs Lab 06/27/14 1341 06/28/14 1230  AST 25 35  ALT 15 17  ALKPHOS 77 64  BILITOT 0.8 1.1  PROT 7.5 7.3  ALBUMIN 4.0 3.8   CBC:  Recent Labs Lab 06/27/14 1341 06/28/14 1230    WBC 9.6 10.1  NEUTROABS  --  8.3*  HGB 13.8 13.1  HCT 42.0 39.8  MCV 94.4 93.0  PLT 126* 134*    Radiographic Studies: Dg Chest 2 View  06/28/2014   CLINICAL DATA:  Status post fall. Hematoma on the posterior right shoulder.  EXAM: CHEST  2 VIEW  COMPARISON:  01/05/2014  FINDINGS: There is no focal parenchymal opacity, pleural effusion, or pneumothorax. There is stable cardiomegaly.  There are surgical clips projecting over the left axilla.  The osseous structures are unremarkable.  IMPRESSION: No active cardiopulmonary disease.   Electronically Signed   By: Kathreen Devoid   On: 06/28/2014 12:32   Dg Shoulder Right  06/28/2014   CLINICAL DATA:  Acute fall today with right shoulder pain and swelling.  EXAM: RIGHT SHOULDER - 2+ VIEW  COMPARISON:  None.  FINDINGS: There is no evidence of fracture or dislocation. There is no evidence of arthropathy or other focal bone abnormality. Soft tissues are unremarkable.  IMPRESSION: Negative.   Electronically Signed   By: Margarette Canada M.D.   On: 06/28/2014 12:33   Ct Head Wo Contrast  06/28/2014   CLINICAL DATA:  Fall with increasing weakness and confusion.  EXAM: CT HEAD WITHOUT CONTRAST  CT CERVICAL SPINE WITHOUT CONTRAST  TECHNIQUE: Multidetector CT imaging of the head and cervical spine was performed following the standard protocol without intravenous contrast. Multiplanar CT image reconstructions of the cervical spine were also generated.  COMPARISON:  11/03/2007 neck CT. brain MRI from yesterday.  FINDINGS: CT HEAD FINDINGS  Skull and Sinuses:Mild left parietal scalp swelling. There is no underlying calvarial fracture.  The sinuses and mastoids are aerated.  Orbits: Bilateral cataract resection.  No traumatic findings  Brain: No evidence of acute infarction, hemorrhage, hydrocephalus, or mass lesion/mass effect. Chronic small-vessel disease with ischemic gliosis around the lateral ventricles. Remote small vessel infarct noted in  the inferior right  cerebellum. The cerebral volume is within normal limits for age.  CT CERVICAL SPINE FINDINGS  No evidence of acute fracture or traumatic malalignment. There is multilevel chronic anterolisthesis associated with advanced facet arthropathy, especially prominent at C3-4 and C4-5 where there is 3 mm of slip. Degenerative disc disease which is focally advanced at C5-6. Uncovertebral spurs at this level cause prominent bilateral foraminal stenosis. No gross cervical canal hematoma or prevertebral edema.  IMPRESSION: 1. No evidence of acute intracranial or cervical spine injury. 2. Left parietal scalp swelling without fracture. 3. Right upper lobe airspace disease, correlate for symptoms of aspiration or pneumonia.   Electronically Signed   By: Monte Fantasia M.D.   On: 06/28/2014 12:15   Ct Cervical Spine Wo Contrast  06/28/2014   CLINICAL DATA:  Fall with increasing weakness and confusion.  EXAM: CT HEAD WITHOUT CONTRAST  CT CERVICAL SPINE WITHOUT CONTRAST  TECHNIQUE: Multidetector CT imaging of the head and cervical spine was performed following the standard protocol without intravenous contrast. Multiplanar CT image reconstructions of the cervical spine were also generated.  COMPARISON:  11/03/2007 neck CT. brain MRI from yesterday.  FINDINGS: CT HEAD FINDINGS  Skull and Sinuses:Mild left parietal scalp swelling. There is no underlying calvarial fracture.  The sinuses and mastoids are aerated.  Orbits: Bilateral cataract resection.  No traumatic findings  Brain: No evidence of acute infarction, hemorrhage, hydrocephalus, or mass lesion/mass effect. Chronic small-vessel disease with ischemic gliosis around the lateral ventricles. 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 The cerebral volume is within normal limits for age.  CT CERVICAL SPINE FINDINGS  No evidence of acute fracture or traumatic malalignment. There is multilevel chronic anterolisthesis associated with advanced facet  arthropathy, especially prominent at C3-4 and C4-5 where there is 3 mm of slip. Degenerative disc disease which is focally advanced at C5-6. Uncovertebral spurs at this level cause prominent bilateral foraminal stenosis. No gross cervical canal hematoma or prevertebral edema.  IMPRESSION: 1. No evidence of acute intracranial or cervical spine injury. 2. Left parietal scalp swelling without fracture. 3. Right upper lobe airspace disease, correlate for symptoms of aspiration or pneumonia.   Electronically Signed   By: Monte Fantasia M.D.   On: 06/28/2014 12:15   Mr Brain Wo Contrast  06/27/2014   CLINICAL DATA:  81 year old female with altered mental status today, recent flank pain. Initial encounter.  EXAM: MRI HEAD WITHOUT CONTRAST  TECHNIQUE: Multiplanar, multiecho pulse sequences of the brain and surrounding structures were obtained without intravenous contrast.  COMPARISON:  Neck CT 11/03/2007.  FINDINGS: Cerebral volume is within normal limits for age. No restricted diffusion to suggest acute infarction. No midline shift, mass effect, evidence of mass lesion, ventriculomegaly, extra-axial collection or acute intracranial hemorrhage. Cervicomedullary junction and pituitary are within normal limits. Negative visualized cervical spine. Major intracranial vascular flow voids are within normal limits.  Small chronic lacunar infarcts in the inferior right cerebellum. Small probable perivascular spaces at the junction of the midbrain and pons, less likely small chronic lacunae. Deep gray matter nuclei similarly are within normal limits for age. Minimal to mild for age nonspecific cerebral white matter T2 and FLAIR hyperintensity. No cortical encephalomalacia. No chronic blood products identified in the brain.  Visible internal auditory structures appear normal. Mastoids are clear. Trace paranasal sinus mucosal thickening. Postoperative changes to the globes. Visualized scalp soft tissues are within normal limits.  Incidental small right nasopharyngeal retention cyst (series 6, image 5). Normal bone  marrow signal.  IMPRESSION: 1.  No acute intracranial abnormality. 2. Mild for age chronic small vessel disease.   Electronically Signed   By: Genevie Ann M.D.   On: 06/27/2014 14:44   Ct Hip Right Wo Contrast  06/28/2014   CLINICAL DATA:  Fall. Increased confusion and weakness. Right hip pain.  EXAM: CT OF THE RIGHT HIP WITHOUT CONTRAST  TECHNIQUE: Multidetector CT imaging of the right hip was performed according to the standard protocol. Multiplanar CT image reconstructions were also generated.  COMPARISON:  06/28/2014 radiographs  FINDINGS: Degenerative spurring of the right femoral head with mild axial loss of articular space. Probably chronically fragmented spur along the greater trochanteric margin posteriorly I do not see a discrete fracture. There appears to be solid interbody fusion at L5-S1 and right foraminal impingement at this level is not excluded.  IMPRESSION: 1. No hip fracture is identified. MRI offers a high sensitivity for subtle fractures might be considered if the patient is not able to bear weight. 2. No hip effusion observed. 3. I cannot exclude right foraminal impingement at the fused L5-S1 level. 4. Mild axial loss of articular space in the right hip, likely degenerative.   Electronically Signed   By: Van Clines M.D.   On: 06/28/2014 13:49   Dg Hip Unilat With Pelvis 2-3 Views Right  06/28/2014   CLINICAL DATA:  Status post fall.  Right hip pain.  EXAM: RIGHT HIP (WITH PELVIS) 2-3 VIEWS  COMPARISON:  None.  FINDINGS: No acute fracture or dislocation. Mild osteoarthritis of the right hip. Small areas of heterotopic ossification adjacent to the medial aspect of the right greater trochanter.  Mild osteoarthritis of the left hip.  IMPRESSION: 1. No acute osseous injury of the right hip. 2. Mild osteoarthritis of bilateral hips, left worse than right.   Electronically Signed   By: Kathreen Devoid   On:  06/28/2014 12:34   *I have personally reviewed the images above*  EKG: Independently reviewed. Atrial fibrillation at 76 bpm.  Q wave in V1.     Assessment/Plan:   Principal Problem:   CAP (community acquired pneumonia) - Maybe the source of her weakness, minimally symptomatic.  Has chronic dyspnea on exertion, no cough. - Afebrile but does have a mild neutophilia on differential, so will place on empiric Levaquin. - ? Aspirating, get ST evaluation. - Check blood culture, sputum culture, Strep pneumo/legionella antigens.  Active Problems:   Generalized weakness / Fall - Treat PNA and monitor. - PT evaluation requested. - Likely will need SNF. - No fractures s/p multiple radiographs as noted below.    AF (atrial fibrillation) on chronic anticoagulation - Rate controlled.  Continue tenormin and Verapamil.  Continue Eliquis for anti-coagulation but may need to weigh benefits against risk of future falls.    Essential hypertension - Continue Tenormin, HCTZ, Diovan and Verapamil.    Hyperlipidemia, mixed - Continue statin.    Hypokalemia - Likely secondary to diuretics.  Replete.    DVT prophylaxis - On Eliquis.  Code Status: DNR confirmed with daughter, Manuela Schwartz Family Communication: Vernie Murders (daughter): [phone] is POA. Disposition Plan: Home versus SNF when stable.  Time spent: 1 hour.  Otila Starn [facility] Pager [phone] Cell: [phone]   If 7PM-7AM, please contact night-coverage [email or web address] Password [facility] 06/28/2014, 5:16 PM

## 2014-06-28 NOTE — ED Notes (Signed)
Pt lives at home alone and family wants her placed in SNF.  Pt has been having increasing weakness and confusion.  Pt today had "episode while transfering where her legs gave out and she made it to the ground on her bottom"  No pain or trauma from this.  Family would like pt place in SNF and states that she cant go back home.

## 2014-06-28 NOTE — ED Notes (Signed)
Spoke to [employer]...klj 15:15

## 2014-06-28 NOTE — ED Provider Notes (Signed)
CSN: [id number]     Arrival date & time 06/28/14  1038 History   First MD Initiated Contact with Patient 06/28/14 1104     Chief Complaint  Patient presents with  . Fall     (Consider location/radiation/quality/duration/timing/severity/associated sxs/prior Treatment) Patient is a 81 y.o. female presenting with fall. The history is provided by a relative (pt was found at home on the floor and she was unable to get up).  Fall This is a new problem. The current episode started 6 to 12 hours ago. The problem occurs constantly. The problem has not changed since onset.Pertinent negatives include no chest pain and no abdominal pain. Nothing aggravates the symptoms. Nothing relieves the symptoms.    Past Medical History  Diagnosis Date  . Renal hypertension 03/07/2005    no evidence of significant diameter reduction, dissection, tortuosity, FMD or orther vascular abnormality; kidneys equal in size, symmetry, with normal cortex and medulla  . Palpitations   . Mixed hyperlipidemia   . Anxiety   . Atrial fibrillation     On chronic Eliquis  . Breast cancer dx'd 1997    left; lt lumpectomy w/ chemo and xrt  . Atrial fibrillation    Past Surgical History  Procedure Laterality Date  . Breast lumpectomy    . Tonsillectomy    . Cesarean section      x 3  . Doppler echocardiography  08/29/2011    EF >06%;  LV systolic fcn normal; doppler flow pattern suggestive of impaired LV relaxation; LA mildly dilated; aortic valve mildly sclerotic, no aortic stenosis  . Cardiovascular stress test  526/2011    R/Dipyridamole - EF 71%; normal myocaridal perfusion w/o evidence of ischemia or infarct; no schintigraphic evidence of inducible myocaridal ischemia; global LV function normal; EKG negative for ischemia; low risk scan   . Cardioversion N/A 11/12/2012    Procedure: CARDIOVERSION;  Surgeon: Troy Sine, MD;  Location: [facility] ENDOSCOPY;  Service: Cardiovascular;  Laterality: N/A;  . Breast lumpectomy  1998     left side   Family History  Problem Relation Age of Onset  . Heart failure Mother   . Breast cancer Mother   . Hypertension Father   . Kidney cancer Father   . Stroke Maternal Grandmother    History  Substance Use Topics  . Smoking status: Former Smoker    Types: Cigarettes    Quit date: 04/02/1993  . Smokeless tobacco: Never Used     Comment: patient quit smoking around 1994  . Alcohol Use: Yes     Comment: 1/4 glass of wine every 2 weeks   OB History    No data available     Review of Systems  Unable to perform ROS: Mental status change  Cardiovascular: Negative for chest pain.  Gastrointestinal: Negative for abdominal pain.      Allergies  Tarka; Adhesive; Azithromycin; Lipitor; and Penicillins  Home Medications   Prior to Admission medications   Medication Sig Start Date End Date Taking? Authorizing Provider  acetaminophen (TYLENOL) 500 MG tablet Take 1,000 mg by mouth at bedtime.    Yes Historical Provider, MD  ALPRAZolam (XANAX) 0.25 MG tablet Take 0.125-0.25 mg by mouth 3 (three) times daily as needed for anxiety.    Yes Historical Provider, MD  apixaban (ELIQUIS) 2.5 MG TABS tablet Take 1 tablet (2.5 mg total) by mouth 2 (two) times daily. 12/23/13  Yes Troy Sine, MD  atenolol (TENORMIN) 50 MG tablet Take 25-50 mg by mouth 2 (  two) times daily.    Yes Historical Provider, MD  calcium carbonate (OS-CAL) 600 MG TABS Take 600 mg by mouth 2 (two) times daily with a meal.     Yes Historical Provider, MD  fluticasone (FLONASE) 50 MCG/ACT nasal spray Place 1 spray into both nostrils at bedtime.  07/04/12  Yes Historical Provider, MD  hydrochlorothiazide (HYDRODIURIL) 25 MG tablet Take 1 tablet (25 mg total) by mouth daily. 03/15/14  Yes Troy Sine, MD  loratadine (CLARITIN) 10 MG tablet Take 10 mg by mouth daily.     Yes Historical Provider, MD  Meclizine HCl (BONINE PO) Take 0.5-1 tablets by mouth daily as needed (dizziness).    Yes Historical Provider, MD   Multiple Vitamins-Minerals (ICAPS MV) TABS Take 3-5 tablets by mouth daily.    Yes Historical Provider, MD  rosuvastatin (CRESTOR) 10 MG tablet Take 1 tablet (10 mg total) by mouth every other day. 05/10/14  Yes Troy Sine, MD  valsartan (DIOVAN) 40 MG tablet Take 1 tablet (40 mg total) by mouth daily. 02/15/14  Yes Troy Sine, MD  verapamil (VERELAN PM) 240 MG 24 hr capsule Take 1 capsule (240 mg total) by mouth at bedtime. 05/03/14  Yes Troy Sine, MD   BP 130/61 mmHg  Pulse 76  Temp(Src) 98.2 F (36.8 C)  Resp 18  SpO2 99% Physical Exam  Constitutional: She appears well-developed.  HENT:  Head: Normocephalic.  Eyes: Conjunctivae and EOM are normal. No scleral icterus.  Neck: Neck supple. No thyromegaly present.  Cardiovascular: Normal rate and regular rhythm.  Exam reveals no gallop and no friction rub.   No murmur heard. Pulmonary/Chest: No stridor. She has no wheezes. She has no rales. She exhibits no tenderness.  Abdominal: She exhibits no distension. There is no tenderness. There is no rebound.  Musculoskeletal: Normal range of motion. She exhibits no edema.  Tenderness mild right hip.  Pt to weak to ambulate  Lymphadenopathy:    She has no cervical adenopathy.  Neurological: She is alert. She exhibits normal muscle tone. Coordination normal.  Pt oriented to person and place only.  She can stand with assistance but cannot ambulate  Skin: No rash noted. No erythema.  Psychiatric: She has a normal mood and affect. Her behavior is normal.    ED Course  Procedures (including critical care time) Labs Review Labs Reviewed  CBC WITH DIFFERENTIAL/PLATELET - Abnormal; Notable for the following:    Platelets 134 (*)    Neutrophils Relative % 83 (*)    Neutro Abs 8.3 (*)    Lymphocytes Relative 8 (*)    All other components within normal limits  COMPREHENSIVE METABOLIC PANEL - Abnormal; Notable for the following:    Sodium 134 (*)    Potassium 3.0 (*)    Chloride 94  (*)    Glucose, Bld 109 (*)    GFR calc non Af Amer 80 (*)    All other components within normal limits  PROTIME-INR - Abnormal; Notable for the following:    Prothrombin Time 16.0 (*)    All other components within normal limits    Imaging Review Dg Chest 2 View  06/28/2014   CLINICAL DATA:  Status post fall. Hematoma on the posterior right shoulder.  EXAM: CHEST  2 VIEW  COMPARISON:  01/05/2014  FINDINGS: There is no focal parenchymal opacity, pleural effusion, or pneumothorax. There is stable cardiomegaly.  There are surgical clips projecting over the left axilla.  The osseous structures are  unremarkable.  IMPRESSION: No active cardiopulmonary disease.   Electronically Signed   By: Kathreen Devoid   On: 06/28/2014 12:32   Dg Shoulder Right  06/28/2014   CLINICAL DATA:  Acute fall today with right shoulder pain and swelling.  EXAM: RIGHT SHOULDER - 2+ VIEW  COMPARISON:  None.  FINDINGS: There is no evidence of fracture or dislocation. There is no evidence of arthropathy or other focal bone abnormality. Soft tissues are unremarkable.  IMPRESSION: Negative.   Electronically Signed   By: Margarette Canada M.D.   On: 06/28/2014 12:33   Ct Head Wo Contrast  06/28/2014   CLINICAL DATA:  Fall with increasing weakness and confusion.  EXAM: CT HEAD WITHOUT CONTRAST  CT CERVICAL SPINE WITHOUT CONTRAST  TECHNIQUE: Multidetector CT imaging of the head and cervical spine was performed following the standard protocol without intravenous contrast. Multiplanar CT image reconstructions of the cervical spine were also generated.  COMPARISON:  11/03/2007 neck CT. brain MRI from yesterday.  FINDINGS: CT HEAD FINDINGS  Skull and Sinuses:Mild left parietal scalp swelling. There is no underlying calvarial fracture.  The sinuses and mastoids are aerated.  Orbits: Bilateral cataract resection.  No traumatic findings  Brain: No evidence of acute infarction, hemorrhage, hydrocephalus, or mass lesion/mass effect. Chronic small-vessel  disease with ischemic gliosis around the lateral ventricles. 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 The cerebral volume is within normal limits for age.  CT CERVICAL SPINE FINDINGS  No evidence of acute fracture or traumatic malalignment. There is multilevel chronic anterolisthesis associated with advanced facet arthropathy, especially prominent at C3-4 and C4-5 where there is 3 mm of slip. Degenerative disc disease which is focally advanced at C5-6. Uncovertebral spurs at this level cause prominent bilateral foraminal stenosis. No gross cervical canal hematoma or prevertebral edema.  IMPRESSION: 1. No evidence of acute intracranial or cervical spine injury. 2. Left parietal scalp swelling without fracture. 3. Right upper lobe airspace disease, correlate for symptoms of aspiration or pneumonia.   Electronically Signed   By: Monte Fantasia M.D.   On: 06/28/2014 12:15   Ct Cervical Spine Wo Contrast  06/28/2014   CLINICAL DATA:  Fall with increasing weakness and confusion.  EXAM: CT HEAD WITHOUT CONTRAST  CT CERVICAL SPINE WITHOUT CONTRAST  TECHNIQUE: Multidetector CT imaging of the head and cervical spine was performed following the standard protocol without intravenous contrast. Multiplanar CT image reconstructions of the cervical spine were also generated.  COMPARISON:  11/03/2007 neck CT. brain MRI from yesterday.  FINDINGS: CT HEAD FINDINGS  Skull and Sinuses:Mild left parietal scalp swelling. There is no underlying calvarial fracture.  The sinuses and mastoids are aerated.  Orbits: Bilateral cataract resection.  No traumatic findings  Brain: No evidence of acute infarction, hemorrhage, hydrocephalus, or mass lesion/mass effect. Chronic small-vessel disease with ischemic gliosis around the lateral ventricles. 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 The cerebral volume is within normal limits for age.  CT CERVICAL SPINE FINDINGS  No evidence of acute  fracture or traumatic malalignment. There is multilevel chronic anterolisthesis associated with advanced facet arthropathy, especially prominent at C3-4 and C4-5 where there is 3 mm of slip. Degenerative disc disease which is focally advanced at C5-6. Uncovertebral spurs at this level cause prominent bilateral foraminal stenosis. No gross cervical canal hematoma or prevertebral edema.  IMPRESSION: 1. No evidence of acute intracranial or cervical spine injury. 2. Left parietal scalp swelling without fracture. 3. Right upper lobe airspace disease,  correlate for symptoms of aspiration or pneumonia.   Electronically Signed   By: Monte Fantasia M.D.   On: 06/28/2014 12:15   Mr Brain Wo Contrast  06/27/2014   CLINICAL DATA:  81 year old female with altered mental status today, recent flank pain. Initial encounter.  EXAM: MRI HEAD WITHOUT CONTRAST  TECHNIQUE: Multiplanar, multiecho pulse sequences of the brain and surrounding structures were obtained without intravenous contrast.  COMPARISON:  Neck CT 11/03/2007.  FINDINGS: Cerebral volume is within normal limits for age. No restricted diffusion to suggest acute infarction. No midline shift, mass effect, evidence of mass lesion, ventriculomegaly, extra-axial collection or acute intracranial hemorrhage. Cervicomedullary junction and pituitary are within normal limits. Negative visualized cervical spine. Major intracranial vascular flow voids are within normal limits.  Small chronic lacunar infarcts in the inferior right cerebellum. Small probable perivascular spaces at the junction of the midbrain and pons, less likely small chronic lacunae. Deep gray matter nuclei similarly are within normal limits for age. Minimal to mild for age nonspecific cerebral white matter T2 and FLAIR hyperintensity. No cortical encephalomalacia. No chronic blood products identified in the brain.  Visible internal auditory structures appear normal. Mastoids are clear. Trace paranasal sinus  mucosal thickening. Postoperative changes to the globes. Visualized scalp soft tissues are within normal limits. Incidental small right nasopharyngeal retention cyst (series 6, image 5). Normal bone marrow signal.  IMPRESSION: 1.  No acute intracranial abnormality. 2. Mild for age chronic small vessel disease.   Electronically Signed   By: Genevie Ann M.D.   On: 06/27/2014 14:44   Ct Hip Right Wo Contrast  06/28/2014   CLINICAL DATA:  Fall. Increased confusion and weakness. Right hip pain.  EXAM: CT OF THE RIGHT HIP WITHOUT CONTRAST  TECHNIQUE: Multidetector CT imaging of the right hip was performed according to the standard protocol. Multiplanar CT image reconstructions were also generated.  COMPARISON:  06/28/2014 radiographs  FINDINGS: Degenerative spurring of the right femoral head with mild axial loss of articular space. Probably chronically fragmented spur along the greater trochanteric margin posteriorly I do not see a discrete fracture. There appears to be solid interbody fusion at L5-S1 and right foraminal impingement at this level is not excluded.  IMPRESSION: 1. No hip fracture is identified. MRI offers a high sensitivity for subtle fractures might be considered if the patient is not able to bear weight. 2. No hip effusion observed. 3. I cannot exclude right foraminal impingement at the fused L5-S1 level. 4. Mild axial loss of articular space in the right hip, likely degenerative.   Electronically Signed   By: Van Clines M.D.   On: 06/28/2014 13:49   Dg Hip Unilat With Pelvis 2-3 Views Right  06/28/2014   CLINICAL DATA:  Status post fall.  Right hip pain.  EXAM: RIGHT HIP (WITH PELVIS) 2-3 VIEWS  COMPARISON:  None.  FINDINGS: No acute fracture or dislocation. Mild osteoarthritis of the right hip. Small areas of heterotopic ossification adjacent to the medial aspect of the right greater trochanter.  Mild osteoarthritis of the left hip.  IMPRESSION: 1. No acute osseous injury of the right hip. 2.  Mild osteoarthritis of bilateral hips, left worse than right.   Electronically Signed   By: Kathreen Devoid   On: 06/28/2014 12:34     EKG Interpretation   Date/Time:  Monday June 28 2014 10:51:53 EDT Ventricular Rate:  76 PR Interval:    QRS Duration: 102 QT Interval:  389 QTC Calculation: 437 R Axis:  107 Text Interpretation:  Atrial fibrillation Right axis deviation  Anteroseptal infarct, old Baseline wander in lead(s) V3 Confirmed by  Josedaniel Haye  MD, Azarria Balint [phone]) on 06/28/2014 2:10:05 PM      MDM   Final diagnoses:  Fall  Weakness    Admit for weakness,  Hypokalemia,  Possible pneumonia    Milton Ferguson, MD 06/28/14 1427

## 2014-06-29 DIAGNOSIS — J181 Lobar pneumonia, unspecified organism: Secondary | ICD-10-CM

## 2014-06-29 DIAGNOSIS — J9601 Acute respiratory failure with hypoxia: Secondary | ICD-10-CM

## 2014-06-29 DIAGNOSIS — R7881 Bacteremia: Secondary | ICD-10-CM

## 2014-06-29 DIAGNOSIS — I4891 Unspecified atrial fibrillation: Secondary | ICD-10-CM

## 2014-06-29 LAB — BASIC METABOLIC PANEL
ANION GAP: 9 (ref 5–15)
BUN: 28 mg/dL — AB (ref 6–23)
CALCIUM: 9.5 mg/dL (ref 8.4–10.5)
CHLORIDE: 100 mmol/L (ref 96–112)
CO2: 25 mmol/L (ref 19–32)
Creatinine, Ser: 0.65 mg/dL (ref 0.50–1.10)
GFR calc non Af Amer: 79 mL/min — ABNORMAL LOW (ref >90)
GLUCOSE: 108 mg/dL — AB (ref 70–99)
Potassium: 3.7 mmol/L (ref 3.5–5.1)
Sodium: 134 mmol/L — ABNORMAL LOW (ref 135–145)

## 2014-06-29 LAB — CBC
HCT: 37.1 % (ref 36.0–46.0)
Hemoglobin: 12.4 g/dL (ref 12.0–15.0)
MCH: 31 pg (ref 26.0–34.0)
MCHC: 33.4 g/dL (ref 30.0–36.0)
MCV: 92.8 fL (ref 78.0–100.0)
PLATELETS: 109 10*3/uL — AB (ref 150–400)
RBC: 4 MIL/uL (ref 3.87–5.11)
RDW: 15.1 % (ref 11.5–15.5)
WBC: 7.2 10*3/uL (ref 4.0–10.5)

## 2014-06-29 LAB — LEGIONELLA ANTIGEN, URINE

## 2014-06-29 LAB — STREP PNEUMONIAE URINARY ANTIGEN: Strep Pneumo Urinary Antigen: NEGATIVE

## 2014-06-29 MED ORDER — VANCOMYCIN HCL IN DEXTROSE 750-5 MG/150ML-% IV SOLN
750.0000 mg | Freq: Two times a day (BID) | INTRAVENOUS | Status: DC
  Administered 2014-06-29 – 2014-07-01 (×4): 750 mg via INTRAVENOUS
  Filled 2014-06-29 (×5): qty 150

## 2014-06-29 MED ORDER — CALCIUM CARBONATE ANTACID 500 MG PO CHEW: 500.0000 mg | CHEWABLE_TABLET | Freq: Two times a day (BID) | ORAL | Status: DC | PRN

## 2014-06-29 MED ORDER — DEXTROSE 5 % IV SOLN
2.0000 g | Freq: Two times a day (BID) | INTRAVENOUS | Status: DC
  Administered 2014-06-29 – 2014-07-01 (×5): 2 g via INTRAVENOUS
  Filled 2014-06-29 (×6): qty 2

## 2014-06-29 NOTE — Progress Notes (Signed)
ANTIBIOTIC CONSULT NOTE - INITIAL  Pharmacy Consult for Vancomycin, Cefepime Indication: Bacteremia  Allergies  Allergen Reactions  . Influenza Vaccines Other (See Comments)    Pt's arm swelled and dr told her not to take vaccine again.  Preston Fleeting [Trandolapril-Verapamil Hcl Er] Other (See Comments)    Increase blood pressure  . Adhesive [Tape] Rash    Adhesive bandaids   . Azithromycin Rash  . Lipitor [Atorvastatin] Rash  . Penicillins Rash    Patient Measurements: Height: 5\' 6"  (167.6 cm) Weight: 172 lb 3.2 oz (78.109 kg) IBW/kg (Calculated) : 59.3  Vital Signs: Temp: 99.8 F (37.7 C) (03/29 0443) Temp Source: Oral (03/29 0443) BP: 119/60 mmHg (03/29 1054) Pulse Rate: 55 (03/29 1054) Intake/Output from previous day: 03/28 0701 - 03/29 0700 In: 340 [P.O.:240; IV Piggyback:100] Out: -  Intake/Output from this shift:    Labs:  Recent Labs  06/27/14 1341 06/28/14 1230 06/29/14 0502  WBC 9.6 10.1 7.2  HGB 13.8 13.1 12.4  PLT 126* 134* 109*  CREATININE 0.68 0.61 0.65   Estimated Creatinine Clearance: 54.2 mL/min (by C-G formula based on Cr of 0.65). No results for input(s): VANCOTROUGH, VANCOPEAK, VANCORANDOM, GENTTROUGH, GENTPEAK, GENTRANDOM, TOBRATROUGH, TOBRAPEAK, TOBRARND, AMIKACINPEAK, AMIKACINTROU, AMIKACIN in the last 72 hours.   Microbiology: Recent Results (from the past 720 hour(s))  Culture, blood (routine x 2) Call MD if unable to obtain prior to antibiotics being given     Status: None (Preliminary result)   Collection Time: 06/28/14  4:31 PM  Result Value Ref Range Status   Specimen Description BLOOD RIGHT HAND  Final   Special Requests AEB 2.5CC  Final   Culture   Final           BLOOD CULTURE RECEIVED NO GROWTH TO DATE CULTURE WILL BE HELD FOR 5 DAYS BEFORE ISSUING A FINAL NEGATIVE REPORT Performed at [facility]    Report Status PENDING  Incomplete  Culture, blood (routine x 2) Call MD if unable to obtain prior to antibiotics being  given     Status: None (Preliminary result)   Collection Time: 06/28/14  4:36 PM  Result Value Ref Range Status   Specimen Description BLOOD RIGHT HAND  Final   Special Requests AEB 2CC  Final   Culture   Final    GRAM NEGATIVE RODS GRAM POSITIVE COCCI IN CHAINS Note: Gram Stain Report Called to,Read Back By and Verified With: Lennox Grumbles 06/29/14 0837 BY SMITHERSJ Performed at [facility]    Report Status PENDING  Incomplete    Medical History: Past Medical History  Diagnosis Date  . Renal hypertension 03/07/2005    no evidence of significant diameter reduction, dissection, tortuosity, FMD or orther vascular abnormality; kidneys equal in size, symmetry, with normal cortex and medulla  . Palpitations   . Mixed hyperlipidemia   . Anxiety   . Atrial fibrillation     On chronic Eliquis  . Breast cancer dx'd 1997    left; lt lumpectomy and XRT  . Pre-diabetes     Assessment: 76 y/oF with PMH of a-fib, HLD, anxiety, breast cancer, mild dementia who presented [facility] ED with lethargy and AMS. 1/2 blood cx 3/28 showed gram negative rods and gram positive cocci in chains. Patient initially placed on Levofloxacin, now changed to Vancomycin and Cefepime for bacteremia with pharmacy requested to assist with dosing.  3/28 >> Levofloxacin >> 3/29 3/29 >> Vancomycin >>  3/29 >> Cefepime >>    Tmax: 99.8 WBCs: WNL,  7.2 Renal: SCr 0.65, CrCl ~ 54 mL/min CG  3/28 blood: 1/2 with gram negative rods, gram positive cocci in chains   Goal of Therapy:  Vancomycin trough level 15-20 mcg/ml  Appropriate antibiotic dosing for renal function and indication Eradication of infection  Plan:   Vancomycin 750 mg IV q12h.  Plan for Vancomycin trough level at steady state.  Cefepime 2g IV q12h.  Monitor renal function, cultures, clinical course.   Lindell Spar, PharmD, BCPS Pager: [phone] 06/29/2014 11:32 AM

## 2014-06-29 NOTE — Evaluation (Signed)
Physical Therapy Evaluation Patient Details Name: Alexandra Luna MRN: [id number] DOB: 1926/12/02 Today's Date: 06/29/2014   History of Present Illness  81 y.o. female with a PMH of mild dementia and atrial fib on chronic Eliquis admitted after fall at home and diagnosed with acute respiratory failure with hypoxia / possible lobar pneumonia, community-acquired pneumonia.  Imaging negative for acute findings  Clinical Impression  Pt admitted with above diagnosis. Pt currently with functional limitations due to the deficits listed below (see PT Problem List).  Pt will benefit from skilled PT to increase their independence and safety with mobility to allow discharge to the venue listed below.   Pt reports not remembering falling at home, daughter in room reports pain earlier esp in R hip however no c/o pain during standing and ambulation during session.  Since pt lives alone, hx of falls recommending SNF at this time.       Follow Up Recommendations SNF    Equipment Recommendations  None recommended by PT    Recommendations for Other Services       Precautions / Restrictions Precautions Precautions: Fall      Mobility  Bed Mobility Overal bed mobility: Needs Assistance Bed Mobility: Rolling;Sidelying to Sit Rolling: Supervision Sidelying to sit: Min assist       General bed mobility comments: verbal cues for log roll technique due to daughters reporting pain various places including hip after fall at home (imaging negative)  Transfers Overall transfer level: Needs assistance Equipment used: Rolling walker (2 wheeled) Transfers: Sit to/from Stand Sit to Stand: Min assist         General transfer comment: verbal cues for safe technique, pt wanted to pull up on RW, assist to rise and steady  Ambulation/Gait Ambulation/Gait assistance: Min assist Ambulation Distance (Feet): 120 Feet Assistive device: Rolling walker (2 wheeled) Gait Pattern/deviations: Step-through  pattern;Drifts right/left;Decreased stride length     General Gait Details: verbal cues for safe use of RW, pt reports furniture walking at home  Stairs            Wheelchair Mobility    Modified Rankin (Stroke Patients Only)       Balance Overall balance assessment: History of Falls                                           Pertinent Vitals/Pain Pain Assessment: No/denies pain    Home Living Family/patient expects to be discharged to:: Private residence Living Arrangements: Alone   Type of Home: [place]: One level Home Equipment: Environmental consultant - 2 wheels      Prior Function Level of Independence: Independent         Comments: pt reports she keeps a journal and has noticed mental decline since Sept entries     Hand Dominance        Extremity/Trunk Assessment               Lower Extremity Assessment: Generalized weakness         Communication   Communication: No difficulties  Cognition Arousal/Alertness: Awake/alert Behavior During Therapy: WFL for tasks assessed/performed Overall Cognitive Status: History of cognitive impairments - at baseline (hx mild dementia, orientated however confused at times)       Memory: Decreased short-term memory              General  Comments      Exercises        Assessment/Plan    PT Assessment Patient needs continued PT services  PT Diagnosis Difficulty walking   PT Problem List Decreased activity tolerance;Decreased balance;Decreased mobility;Decreased strength;Decreased knowledge of use of DME;Decreased safety awareness  PT Treatment Interventions DME instruction;Gait training;Functional mobility training;Patient/family education;Therapeutic activities;Therapeutic exercise;Balance training   PT Goals (Current goals can be found in the Care Plan section) Acute Rehab PT Goals PT Goal Formulation: With patient/family Time For Goal Achievement: 07/06/14 Potential  to Achieve Goals: Good    Frequency Min 3X/week   Barriers to discharge        Co-evaluation               End of Session Equipment Utilized During Treatment: Gait belt Activity Tolerance: Patient tolerated treatment well Patient left: in chair;with call bell/phone within reach;with chair alarm set Nurse Communication: Mobility status         Time: 6147-0929 PT Time Calculation (min) (ACUTE ONLY): 25 min   Charges:   PT Evaluation $Initial PT Evaluation Tier I: 1 Procedure     PT G Codes:        Jia Mohamed,KATHrine E 06/29/2014, 12:58 PM Carmelia Bake, PT, DPT 06/29/2014 Pager: [phone]

## 2014-06-29 NOTE — Progress Notes (Signed)
CRITICAL VALUE ALERT  Critical value received:  Gram Negative rod & Gram positive cocci in chains  Date of notification:  06/29/14  Time of notification:  0838  Critical value read back:Yes.    Nurse who received alert:  Catie Despina Pole  MD notified (1st page):  Dr. Charlies Silvers  Time of first page:  [phone]  MD notified (2nd page):  Time of second page:  Responding MD: Dr. Charlies Silvers  Time MD responded:  9030

## 2014-06-29 NOTE — Progress Notes (Signed)
Patient ID: Alexandra Luna, female   DOB: July 29, 1926, 81 y.o.   MRN: [id number] [facility] PROGRESS NOTE  Alexandra Luna VOZ:[id number] DOB: Feb 13, 1927 DOA: 06/28/2014 PCP: Lilian Coma, MD  Brief narrative:    81 y.o. female with a PMH of mild dementia and atrial fib on chronic Eliquis who presented to [facility] long hospital with lethargy and worse mental status changes. Patient underwent extensive evaluation with CT head and CT cervical spine which did not reveal acute fractures. X-ray of the hips, shoulder did not reveal acute fractures. Chest x-ray did not show acute cardiopulmonary findings. Patient was admitted for further evaluation of lethargy and altered mental status. Of note, she had MRI of the brain on 06/27/2014 which did not show acute intracranial findings. Hospital course is complicated with finding of gram-negative rod and gram-positive bacteremia. She was initially placed on Levaquin but we broadened the antibiotics to vancomycin and Zosyn until final report of blood cultures is back.   Assessment/Plan:    Principal Problem: Acute respiratory failure with hypoxia / possible lobar pneumonia, community-acquired pneumonia - Initial hypoxia likely secondary to possible lobar pneumonia. Patient was started empirically on Levaquin. Because of gram-positive and gram-negative rod bacteremia we broadened the antibiotics to vancomycin and Zosyn. - Respiratory status is stable. - Follow up final results of blood cultures. - Strep pneumonia is negative. Legionella is pending.  Active Problems:   Gram-negative and gram-positive bacteremia - Blood culture on the admission is growing gram-negative rods and gram-positive cocci in chains - We changed to Levaquin to broad-spectrum antibiotics, Vanco and Zosyn to cover for bacteremia until we get final results back. - We will repeat blood cultures tomorrow to make sure bacteremia clearing up.    AF (atrial fibrillation) - CHADS2 vasc  score at least 3. - Patient is on anticoagulation with apixaban. - Rate controlled with atenolol.    Essential hypertension - Continue atenolol 25 mg at bedtime. Continue of Avapro 37.5 mg daily. Continue verapamil 240 mg at bedtime.    Hyperlipidemia, mixed - Continue Crestor 10 mg daily    Hypokalemia - Unclear etiology. Supplemented and within normal limits.    Thrombocytopenia - Likely secondary to anticoagulation. No reports of bleeding. - Follow-up CBC tomorrow.   DVT Prophylaxis  - On anticoagulation with apixaban twice daily.   Code Status: DNR/DNI  Family Communication:  Family not at the bedside this morning Disposition Plan: Requires physical therapy evaluation for safe discharge plan. Found to have bacteremia so we broadened antibiotic regimen.  IV access:  Peripheral IV  Procedures and diagnostic studies:    Dg Chest 2 View 06/28/2014  No active cardiopulmonary disease.   Electronically Signed   By: Kathreen Devoid   On: 06/28/2014 12:32   Dg Shoulder Right 06/28/2014   Negative.   Electronically Signed   By: Margarette Canada M.D.   On: 06/28/2014 12:33   Ct Head Wo Contrast 06/28/2014   1. No evidence of acute intracranial or cervical spine injury. 2. Left parietal scalp swelling without fracture. 3. Right upper lobe airspace disease, correlate for symptoms of aspiration or pneumonia.   Electronically Signed   By: Monte Fantasia M.D.   On: 06/28/2014 12:15   Ct Cervical Spine Wo Contrast 06/28/2014  1. No evidence of acute intracranial or cervical spine injury. 2. Left parietal scalp swelling without fracture. 3. Right upper lobe airspace disease, correlate for symptoms of aspiration or pneumonia.   Electronically Signed   By: Neva Seat.D.  On: 06/28/2014 12:15   Mr Brain Wo Contrast 06/27/2014 1.  No acute intracranial abnormality. 2. Mild for age chronic small vessel disease.   Electronically Signed   By: Genevie Ann M.D.   On: 06/27/2014 14:44   Ct Hip Right Wo  Contrast 06/28/2014   1. No hip fracture is identified. MRI offers a high sensitivity for subtle fractures might be considered if the patient is not able to bear weight. 2. No hip effusion observed. 3. I cannot exclude right foraminal impingement at the fused L5-S1 level. 4. Mild axial loss of articular space in the right hip, likely degenerative.   Electronically Signed   By: Van Clines M.D.   On: 06/28/2014 13:49   Dg Hip Unilat With Pelvis 2-3 Views Right 06/28/2014  1. No acute osseous injury of the right hip. 2. Mild osteoarthritis of bilateral hips, left worse than right.   Electronically Signed   By: Kathreen Devoid   On: 06/28/2014 12:34    Medical Consultants:  None  Other Consultants:  Physical therapy   IAnti-Infectives:   Levaquin 06/28/2014 - 06/29/2014 Vancomycin and Zosyn started 06/29/2014 -->   Leisa Lenz, MD  [facility] Pager [phone]  If 7PM-7AM, please contact night-coverage [email or web address] Password [facility] 06/29/2014, 10:56 AM   LOS: 1 day    HPI/Subjective: No acute overnight events.  Objective: Filed Vitals:   06/28/14 2203 06/28/14 2334 06/29/14 0443 06/29/14 1054  BP:   128/57 119/60  Pulse:   75 55  Temp:   99.8 F (37.7 C)   TempSrc:   Oral   Resp:   16   Height:      Weight:      SpO2: 100% 99% 93%     Intake/Output Summary (Last 24 hours) at 06/29/14 1056 Last data filed at 06/28/14 1900  Gross per 24 hour  Intake    340 ml  Output      0 ml  Net    340 ml    Exam:   General:  Pt is sleeping, no acute distress  Cardiovascular: Bradycardia, appreciate S1, S2  Respiratory: Diminished breath sounds, no wheezing  Abdomen: Soft, non tender, non distended, bowel sounds present  Extremities: No edema, pulses DP and PT palpable bilaterally  Neuro: Grossly nonfocal  Data Reviewed: Basic Metabolic Panel:  Recent Labs Lab 06/27/14 1341 06/28/14 1230 06/29/14 0502  NA 133* 134* 134*  K 3.2* 3.0* 3.7  CL 94* 94* 100   CO2 28 28 25   GLUCOSE 122* 109* 108*  BUN 17 21 28*  CREATININE 0.68 0.61 0.65  CALCIUM 9.8 10.2 9.5   Liver Function Tests:  Recent Labs Lab 06/27/14 1341 06/28/14 1230  AST 25 35  ALT 15 17  ALKPHOS 77 64  BILITOT 0.8 1.1  PROT 7.5 7.3  ALBUMIN 4.0 3.8   No results for input(s): LIPASE, AMYLASE in the last 168 hours. No results for input(s): AMMONIA in the last 168 hours. CBC:  Recent Labs Lab 06/27/14 1341 06/28/14 1230 06/29/14 0502  WBC 9.6 10.1 7.2  NEUTROABS  --  8.3*  --   HGB 13.8 13.1 12.4  HCT 42.0 39.8 37.1  MCV 94.4 93.0 92.8  PLT 126* 134* 109*   Cardiac Enzymes: No results for input(s): CKTOTAL, CKMB, CKMBINDEX, TROPONINI in the last 168 hours. BNP: Invalid input(s): POCBNP CBG: No results for input(s): GLUCAP in the last 168 hours.  Culture, blood (routine x 2)    Collection Time: 06/28/14  4:31 PM  Result Value Ref Range Status   Specimen Description BLOOD RIGHT HAND  Final   Special Requests AEB 2.5CC  Final   Culture   Final           BLOOD CULTURE RECEIVED NO GROWTH TO DATE    Report Status PENDING  Incomplete  Culture, blood (routine x 2) Call MD if unable to obtain prior to antibiotics being given     Status: None (Preliminary result)   Collection Time: 06/28/14  4:36 PM  Result Value Ref Range Status   Specimen Description BLOOD RIGHT HAND  Final   Special Requests AEB 2CC  Final   Culture   Final    GRAM NEGATIVE RODS GRAM POSITIVE COCCI IN CHAINS     Report Status PENDING  Incomplete     Scheduled Meds: . apixaban  2.5 mg Oral BID  . atenolol  25 mg Oral QHS  . calcium carbonate  500 mg Oral BID WC  . feeding supplement (ENSURE ENLIVE)  237 mL Oral BID BM  . fluticasone  1 spray Each Nare QHS  . irbesartan  37.5 mg Oral Daily  . loratadine  10 mg Oral Daily  . multivitamin-lutein  1 capsule Oral Daily  . rosuvastatin  10 mg Oral QODAY  . verapamil  240 mg Oral QHS   Continuous Infusions:

## 2014-06-29 NOTE — Progress Notes (Signed)
Clinical Social Work Department CLINICAL SOCIAL WORK PLACEMENT NOTE 06/29/2014  Patient:  Alexandra Luna, Alexandra Luna  Account Number:  [id number] Admit date:  06/28/2014  Clinical Social Worker:  Renold Genta  Date/time:  06/29/2014 01:51 PM  Clinical Social Work is seeking post-discharge placement for this patient at the following level of care:   SKILLED NURSING   (*CSW will update this form in Epic as items are completed)   06/29/2014  Patient/family provided with [facility] Department of Clinical Social Work's list of facilities offering this level of care within the geographic area requested by the patient (or if unable, by the patient's family).  06/29/2014  Patient/family informed of their freedom to choose among providers that offer the needed level of care, that participate in Medicare, Medicaid or managed care program needed by the patient, have an available bed and are willing to accept the patient.  06/29/2014  Patient/family informed of [facility]' ownership interest in [facility], as well as of the fact that they are under no obligation to receive care at this facility.  PASARR submitted to EDS on 06/29/2014 PASARR number received on 06/29/2014  FL2 transmitted to all facilities in geographic area requested by pt/family on  06/29/2014 FL2 transmitted to all facilities within larger geographic area on   Patient informed that his/her managed care company has contracts with or will negotiate with  certain facilities, including the following:     Patient/family informed of bed offers received:  06/29/2014 Patient chooses bed at  Physician recommends and patient chooses bed at    Patient to be transferred to  on   Patient to be transferred to facility by  Patient and family notified of transfer on  Name of family member notified:    The following physician request were entered in Epic:   Additional Comments:    Raynaldo Opitz, [place] Social Worker cell #: [phone]

## 2014-06-29 NOTE — Progress Notes (Signed)
Speech Language Pathology Treatment: Dysphagia  Patient Details Name: Alexandra Luna MRN: [id number] DOB: May 16, 1926 Today's Date: 06/29/2014 Time: 0034-9179 SLP Time Calculation (min) (ACUTE ONLY): 12 min  Assessment / Plan / Recommendation Clinical Impression  Daughter with questions re: the swallow evaluation (pt. Told her she did not pass).  Reviewed results and recommendations with pt and her daughter, reassuring her that she did pass, and would be on a soft-mechanical diet with thin liquids.  Discussed feeding issues noted, and ways to compensate.  Pt and family both stated that they realize she can not go home alone at this point.  Questions answered, precautions posted.  SLP will f/u for assessment of diet tolerance.  Recommend f/u ST at SNF for cognitive evaluation and treatment.   HPI HPI: 81 yo female admitted after being found on floor after falling.  Family reports MS decline.  PMH:  Mild dementia and atrial fib.  MRI 06/27/14 was negative for acute changes; CXR: No active disease   Pertinent Vitals Pain Assessment: No/denies pain  SLP Plan  Continue with current plan of care    Recommendations Diet recommendations: Dysphagia 3 (mechanical soft);Thin liquid Liquids provided via: Cup;Straw Medication Administration: Whole meds with puree Supervision: Staff to assist with self feeding Compensations: Slow rate;Small sips/bites Postural Changes and/or Swallow Maneuvers: Seated upright 90 degrees              Oral Care Recommendations: Oral care BID Follow up Recommendations: Skilled Nursing facility Plan: Continue with current plan of care    GO     Quinn Axe T 06/29/2014, 2:43 PM

## 2014-06-29 NOTE — Evaluation (Signed)
Clinical/Bedside Swallow Evaluation Patient Details  Name: Alexandra Luna MRN: [id number] Date of Birth: [birth year]  Today's Date: 06/29/2014 Time: SLP Start Time (ACUTE ONLY): 1337 SLP Stop Time (ACUTE ONLY): 1415 SLP Time Calculation (min) (ACUTE ONLY): 38 min  Past Medical History:  Past Medical History  Diagnosis Date  . Renal hypertension 03/07/2005    no evidence of significant diameter reduction, dissection, tortuosity, FMD or orther vascular abnormality; kidneys equal in size, symmetry, with normal cortex and medulla  . Palpitations   . Mixed hyperlipidemia   . Anxiety   . Atrial fibrillation     On chronic Eliquis  . Breast cancer dx'd 1997    left; lt lumpectomy and XRT  . Pre-diabetes    Past Surgical History:  Past Surgical History  Procedure Laterality Date  . Breast lumpectomy    . Tonsillectomy    . Cesarean section      x 3  . Doppler echocardiography  08/29/2011    EF >70%;  LV systolic fcn normal; doppler flow pattern suggestive of impaired LV relaxation; LA mildly dilated; aortic valve mildly sclerotic, no aortic stenosis  . Cardiovascular stress test  526/2011    R/Dipyridamole - EF 71%; normal myocaridal perfusion w/o evidence of ischemia or infarct; no schintigraphic evidence of inducible myocaridal ischemia; global LV function normal; EKG negative for ischemia; low risk scan   . Cardioversion N/A 11/12/2012    Procedure: CARDIOVERSION;  Surgeon: Troy Sine, MD;  Location: [facility] ENDOSCOPY;  Service: Cardiovascular;  Laterality: N/A;  . Breast lumpectomy  1998    left side   HPI:  81 yo female admitted after being found on floor after falling.  Family reports MS decline.  PMH:  Mild dementia and atrial fib.  MRI 06/27/14 was negative for acute changes; CXR: No active disease   Assessment / Plan / Recommendation Clinical Impression  Pt appears to swallow without overt s/s of aspiration or dysphagia noted at b/s.  Pt did state "I'm confused, as always," and  had difficulty feeding herself secondary to ideational apraxia.  For example, when asked to take a sip of water, pt attempted to put her fingers in the cup to scoop water instead of using the straw (that was in the cup).  Pt also began to drink from the pitcher rather than the cup.  Recommend OT eval.    Aspiration Risk  Mild    Diet Recommendation Dysphagia 3 (Mechanical Soft);Thin liquid   Liquid Administration via: Cup;Straw Medication Administration: Whole meds with puree Supervision: Staff to assist with self feeding Compensations: Slow rate;Small sips/bites Postural Changes and/or Swallow Maneuvers: Seated upright 90 degrees    Other  Recommendations Recommended Consults: OT self-feeding (Ideational apraxia) Oral Care Recommendations: Oral care BID Other Recommendations: Clarify dietary restrictions   Follow Up Recommendations  Skilled Nursing facility    Frequency and Duration min 1 x/week  1 week   Pertinent Vitals/Pain CXR clear; no c/o pain         Swallow Study Prior Functional Status  Type of Home: House    General HPI: 81 yo female admitted after being found on floor after falling.  Family reports MS decline.  PMH:  Mild dementia and atrial fib.  MRI 06/27/14 was negative for acute changes; CXR: No active disease Type of Study: Bedside swallow evaluation Previous Swallow Assessment: none Diet Prior to this Study: Regular;Thin liquids Temperature Spikes Noted: No Respiratory Status: Room air History of Recent Intubation: No Behavior/Cognition: Alert;Cooperative;Pleasant mood;Confused;Requires cueing;Decreased  sustained attention;Distractible Oral Cavity - Dentition: Dentures, top;Dentures, bottom Self-Feeding Abilities: Able to feed self;Needs assist (ideational apraxia (fingers in cup to get water)) Patient Positioning: Upright in chair Baseline Vocal Quality: Clear Volitional Cough: Strong Volitional Swallow: Able to elicit    Oral/Motor/Sensory Function  Overall Oral Motor/Sensory Function: Appears within functional limits for tasks assessed   Ice Chips Ice chips: Within functional limits Presentation: Spoon   Thin Liquid Thin Liquid: Within functional limits Presentation: Cup;Straw    Nectar Thick Nectar Thick Liquid: Not tested   Honey Thick Honey Thick Liquid: Not tested   Puree Puree: Within functional limits   Solid   GO    Solid: Within functional limits Presentation: Alexandra Luna, Alexandra Luna 06/29/2014,2:16 PM

## 2014-06-29 NOTE — Progress Notes (Signed)
Clinical Social Work Department BRIEF PSYCHOSOCIAL ASSESSMENT 06/29/2014  Patient:  Alexandra Luna, Alexandra Luna     Account Number:  [id number]     Admit date:  06/28/2014  Clinical Social Worker:  [facility], CLINICAL SOCIAL WORKER  Date/Time:  06/29/2014 01:37 PM  Referred by:  Physician  Date Referred:  06/29/2014 Referred for  SNF Placement   Other Referral:   Interview type:  Patient Other interview type:   daughter, Vernie Murders via phone    PSYCHOSOCIAL DATA Living Status:  ALONE Admitted from facility:   Level of care:   Primary support name:  Vernie Murders Primary support relationship to patient:  CHILD, ADULT Degree of support available:   Adequate    CURRENT CONCERNS Current Concerns  Post-Acute Placement   Other Concerns:    SOCIAL WORK ASSESSMENT / PLAN CSw received consult from PT who recommends pt goes to a SNF for short term rehab. CSW completed FL2 and faxed pt out to [facility].   Assessment/plan status:  Information/Referral to [employer] Other assessment/ plan:   Information/referral to community resources:   CSW completed FL2 and faxed out to [facility], awaiting bed offers.    PATIENT'S/FAMILY'S RESPONSE TO PLAN OF CARE: CSW met with pt at bedside, introduced self and explained role. CSW inquired about pt ambulations and how pt feels getting around. Pt states that she thought that she did better than she expected to do. CSW informed pt that working with PT, they recommend SNF for some rehab. Pt is not aware of SNFs, CSW gave pt a list of SNF that made bed offers. Pt mentions that she has heard of [facility] and will look more into that facility when her daughter, Manuela Schwartz and [name] arrive after work. Pt asks CSW to contact daughter to make them aware. CSW contacted both daughters, Manuela Schwartz stated that she will be by shortly and will discuss DC plans with sister. Daughter, Manuela Schwartz told CSW that they will have a decision in the morning. CSW left  list of bed offers and phone numbers and encourage daughter to call if they have any questions or concerns. CSW will continue to follow for further DC planning.       Glorious Peach BSW Intern

## 2014-06-30 NOTE — Progress Notes (Signed)
Speech Language Pathology Treatment: Dysphagia  Patient Details Name: TAMRE CASS MRN: [id number] DOB: 31-Oct-1926 Today's Date: 06/30/2014 Time: 1355-1410 SLP Time Calculation (min) (ACUTE ONLY): 15 min  Assessment / Plan / Recommendation Clinical Impression  Pt seen for dysphagia management - self reports eating approx 2/3 to 3/4 of each item on tray.  Pt denies h/o dysphagia - admits to deficits with memory.  SLP observed pt consuming water - timely swallow without indications of airway compromise.    Pt stated she missed brushing her teeth - SLP set pt up to clean her dentition and rinsed pt's dentures.  Pt placed dentures and demonstrated good ability to manage.  Suspect improved mental status contributing.     Recommend advance diet to regular/thin = pt also denies h/o GERD.  SLP to sign off as all education completed and pt appears to be tolerating po diet well.  Thanks!    HPI HPI: 81 yo female admitted after being found on floor after falling.  Family reports MS decline.  PMH:  Mild dementia and atrial fib.  MRI 06/27/14 was negative for acute changes; CXR: No active disease. swallow eval conducted due to MD order, suspect for possible asp pna per imaging.     Pertinent Vitals Pain Assessment: No/denies pain  SLP Plan  All goals met    Recommendations Diet recommendations: Regular;Thin liquid Liquids provided via: Cup;Straw Medication Administration: Whole meds with puree Supervision: Patient able to self feed;Intermittent supervision to cue for compensatory strategies Compensations: Slow rate;Small sips/bites (oral care after meals) Postural Changes and/or Swallow Maneuvers: Seated upright 90 degrees              Oral Care Recommendations: Oral care BID Plan: All goals met    [place], [place] [facility] SLP [phone]

## 2014-06-30 NOTE — Progress Notes (Signed)
Patient ID: Alexandra Luna, female   DOB: 24-Nov-1926, 81 y.o.   MRN: [id number] [facility] PROGRESS NOTE  Alexandra Luna JQB:[id number] DOB: Jul 07, 1926 DOA: 06/28/2014 PCP: Lilian Coma, MD  Brief narrative:    81 y.o. female with a PMH of mild dementia and atrial fib on chronic Eliquis who presented to [facility] long hospital with lethargy and worse mental status changes. Patient underwent extensive evaluation with CT head and CT cervical spine which did not reveal acute fractures. X-ray of the hips, shoulder did not reveal acute fractures. Chest x-ray did not show acute cardiopulmonary findings. Patient was admitted for further evaluation of lethargy and altered mental status. Of note, she had MRI of the brain on 06/27/2014 which did not show acute intracranial findings. Hospital course is complicated with finding of gram-negative rod and gram-positive bacteremia. She was initially placed on Levaquin but we broadened the antibiotics to vancomycin and Zosyn until blood culture final report available.   Assessment/Plan:    Principal Problem: Acute respiratory failure with hypoxia / possible lobar pneumonia, community-acquired pneumonia - Initial hypoxia likely secondary to possible lobar pneumonia. Patient initially on Levaquin but because of finding of bacteremia, gram-negative rods and gram-positive cocci we have broaden the antibiotic regimen to include vancomycin and Zosyn. Levaquin was then stopped. - Respiratory status remains stable. - Strep pneumonia and legionella are negative.  Active Problems:   Gram-negative and gram-positive bacteremia - Blood culture on the admission is growing gram-negative rods and gram-positive cocci in chains. Repeat blood cultures today. Order placed. - As mentioned above, Levaquin was stopped and changed to vancomycin and Zosyn to cover for bacteremia until final blood culture report available.    AF (atrial fibrillation) - CHADS2 vasc score at least  3. - Patient is on anticoagulation with apixaban. - Rate controlled with atenolol.    Essential hypertension - Continue atenolol 25 mg at bedtime, Avapro 37.5 mg daily, verapamil 240 mg at bedtime. - Blood pressure stable    Hyperlipidemia, mixed - Continue Crestor 10 mg daily    Hypokalemia - Unclear etiology. Supplemented and within normal limits.    Thrombocytopenia - Likely secondary to anticoagulation. No reports of bleeding.   DVT Prophylaxis  - On anticoagulation with apixaban twice daily.   Code Status: DNR/DNI  Family Communication:  Family not at the bedside this morning Disposition Plan: likely to SNF once stable. Blood cultures final report not yet available. As soon as we get final report we can change antibiotics from IV to by mouth regimen and she would be okay for discharge.  IV access:  Peripheral IV  Procedures and diagnostic studies:    Dg Chest 2 View 06/28/2014  No active cardiopulmonary disease.   Electronically Signed   By: Kathreen Devoid   On: 06/28/2014 12:32   Dg Shoulder Right 06/28/2014   Negative.   Electronically Signed   By: Margarette Canada M.D.   On: 06/28/2014 12:33   Ct Head Wo Contrast 06/28/2014   1. No evidence of acute intracranial or cervical spine injury. 2. Left parietal scalp swelling without fracture. 3. Right upper lobe airspace disease, correlate for symptoms of aspiration or pneumonia.   Electronically Signed   By: Monte Fantasia M.D.   On: 06/28/2014 12:15   Ct Cervical Spine Wo Contrast 06/28/2014  1. No evidence of acute intracranial or cervical spine injury. 2. Left parietal scalp swelling without fracture. 3. Right upper lobe airspace disease, correlate for symptoms of aspiration or pneumonia.  Electronically Signed   By: Monte Fantasia M.D.   On: 06/28/2014 12:15   Mr Brain Wo Contrast 06/27/2014 1.  No acute intracranial abnormality. 2. Mild for age chronic small vessel disease.   Electronically Signed   By: Genevie Ann M.D.   On:  06/27/2014 14:44   Ct Hip Right Wo Contrast 06/28/2014   1. No hip fracture is identified. MRI offers a high sensitivity for subtle fractures might be considered if the patient is not able to bear weight. 2. No hip effusion observed. 3. I cannot exclude right foraminal impingement at the fused L5-S1 level. 4. Mild axial loss of articular space in the right hip, likely degenerative.   Electronically Signed   By: Van Clines M.D.   On: 06/28/2014 13:49   Dg Hip Unilat With Pelvis 2-3 Views Right 06/28/2014  1. No acute osseous injury of the right hip. 2. Mild osteoarthritis of bilateral hips, left worse than right.   Electronically Signed   By: Kathreen Devoid   On: 06/28/2014 12:34    Medical Consultants:  None  Other Consultants:  Physical therapy   IAnti-Infectives:   Levaquin 06/28/2014 - 06/29/2014 Vancomycin and cefepime started 06/29/2014 -->   Leisa Lenz, MD  [facility] Pager [phone]  If 7PM-7AM, please contact night-coverage [email or web address] Password [email or web address] 06/30/2014, 10:46 AM   LOS: 2 days    HPI/Subjective: No acute overnight events.  Objective: Filed Vitals:   06/29/14 1054 06/29/14 1459 06/29/14 2002 06/30/14 0418  BP: 119/60 132/93 143/76 158/76  Pulse: 55 60 77 81  Temp:  98.8 F (37.1 C) 99.2 F (37.3 C) 98.1 F (36.7 C)  TempSrc:  Oral Oral Oral  Resp:  17 20 16   Height:      Weight:      SpO2:  100% 100% 98%    Intake/Output Summary (Last 24 hours) at 06/30/14 1046 Last data filed at 06/30/14 0955  Gross per 24 hour  Intake    560 ml  Output      0 ml  Net    560 ml    Exam:   General:  Pt is not in acute distress  Cardiovascular: Rate control, appreciate S1, S2  Respiratory: No wheezing or crackles  Abdomen: Nontender abdomen, nondistended, appreciate bowel sounds  Extremities: Pulses palpable, no swelling  Neuro: No focal deficits  Data Reviewed: Basic Metabolic Panel:  Recent Labs Lab 06/27/14 1341 06/28/14 1230  06/29/14 0502  NA 133* 134* 134*  K 3.2* 3.0* 3.7  CL 94* 94* 100  CO2 28 28 25   GLUCOSE 122* 109* 108*  BUN 17 21 28*  CREATININE 0.68 0.61 0.65  CALCIUM 9.8 10.2 9.5   Liver Function Tests:  Recent Labs Lab 06/27/14 1341 06/28/14 1230  AST 25 35  ALT 15 17  ALKPHOS 77 64  BILITOT 0.8 1.1  PROT 7.5 7.3  ALBUMIN 4.0 3.8   No results for input(s): LIPASE, AMYLASE in the last 168 hours. No results for input(s): AMMONIA in the last 168 hours. CBC:  Recent Labs Lab 06/27/14 1341 06/28/14 1230 06/29/14 0502  WBC 9.6 10.1 7.2  NEUTROABS  --  8.3*  --   HGB 13.8 13.1 12.4  HCT 42.0 39.8 37.1  MCV 94.4 93.0 92.8  PLT 126* 134* 109*   Cardiac Enzymes: No results for input(s): CKTOTAL, CKMB, CKMBINDEX, TROPONINI in the last 168 hours. BNP: Invalid input(s): POCBNP CBG: No results for input(s): GLUCAP in the last 168 hours.  Culture, blood (routine x 2)    Collection Time: 06/28/14  4:31 PM  Result Value Ref Range Status   Specimen Description BLOOD RIGHT HAND  Final   Special Requests AEB 2.5CC  Final   Culture   Final           BLOOD CULTURE RECEIVED NO GROWTH TO DATE    Report Status PENDING  Incomplete  Culture, blood (routine x 2) Call MD if unable to obtain prior to antibiotics being given     Status: None (Preliminary result)   Collection Time: 06/28/14  4:36 PM  Result Value Ref Range Status   Specimen Description BLOOD RIGHT HAND  Final   Special Requests AEB 2CC  Final   Culture   Final    GRAM NEGATIVE RODS GRAM POSITIVE COCCI IN CHAINS     Report Status PENDING  Incomplete     Scheduled Meds: . apixaban  2.5 mg Oral BID  . atenolol  25 mg Oral QHS  . ceFEPime (MAXIPIME) IV  2 g Intravenous Q12H  . feeding supplement   237 mL Oral BID BM  . fluticasone  1 spray Each Nare QHS  . irbesartan  37.5 mg Oral Daily  . loratadine  10 mg Oral Daily  . multivitamin-lutein  1 capsule Oral Daily  . rosuvastatin  10 mg Oral QODAY  . vancomycin  750 mg  Intravenous Q12H  . verapamil  240 mg Oral QHS

## 2014-07-01 LAB — VANCOMYCIN, TROUGH: Vancomycin Tr: 10.4 ug/mL (ref 10.0–20.0)

## 2014-07-01 MED ORDER — VANCOMYCIN HCL 10 G IV SOLR
1250.0000 mg | INTRAVENOUS | Status: AC
  Administered 2014-07-01: 1250 mg via INTRAVENOUS
  Filled 2014-07-01: qty 1250

## 2014-07-01 MED ORDER — ATENOLOL 25 MG PO TABS: 25.0000 mg | ORAL_TABLET | Freq: Every day | ORAL | Status: DC

## 2014-07-01 MED ORDER — AMOXICILLIN 500 MG PO TABS: 500.0000 mg | ORAL_TABLET | Freq: Two times a day (BID) | ORAL | Status: DC

## 2014-07-01 MED ORDER — SULFAMETHOXAZOLE-TRIMETHOPRIM 800-160 MG PO TABS: 1.0000 | ORAL_TABLET | Freq: Two times a day (BID) | ORAL | Status: DC

## 2014-07-01 MED ORDER — ENSURE ENLIVE PO LIQD: 237.0000 mL | Freq: Two times a day (BID) | ORAL | Status: DC

## 2014-07-01 MED ORDER — AMOXICILLIN 250 MG PO CAPS
500.0000 mg | ORAL_CAPSULE | Freq: Once | ORAL | Status: AC
  Administered 2014-07-01: 500 mg via ORAL
  Filled 2014-07-01: qty 2

## 2014-07-01 MED ORDER — VANCOMYCIN HCL 10 G IV SOLR: 1250.0000 mg | Freq: Two times a day (BID) | INTRAVENOUS | Status: DC

## 2014-07-01 MED ORDER — AMOXICILLIN 875 MG PO TABS: 875.0000 mg | ORAL_TABLET | Freq: Two times a day (BID) | ORAL | Status: DC

## 2014-07-01 NOTE — Progress Notes (Signed)
Patient ID: Alexandra Luna, female   DOB: [birth year], 81 y.o.   MRN: [id number] [facility] PROGRESS NOTE  MADY OUBRE [id number] DOB: [birth year] DOA: 06/28/2014 PCP: Lilian Coma, MD  Brief narrative:    81 y.o. female with a PMH of mild dementia and atrial fib on chronic Eliquis who presented to [facility] long hospital with lethargy and worse mental status changes.  Patient underwent extensive evaluation with CT head and CT cervical spine which did not reveal acute fractures. X-ray of the hips, shoulder did not reveal acute fractures. Chest x-ray did not show acute cardiopulmonary findings. Patient was admitted for further evaluation of lethargy and altered mental status. Of note, she had MRI of the brain on 06/27/2014 which did not show acute intracranial findings.  Hospital course is complicated with finding of gram-negative rod and gram-positive bacteremia. She was initially placed on Levaquin but we broadened the antibiotics to vancomycin and Zosyn until blood culture final report available.   Assessment/Plan:    Principal Problem: Acute respiratory failure with hypoxia / possible lobar pneumonia, community-acquired pneumonia - Initial hypoxia likely secondary to possible lobar pneumonia. Patient initially on Levaquin but because of finding of bacteremia antibiotic regimen changed to vancomycin and Zosyn. - Respiratory status is stable. Strep pneumonia and legionella are negative.  Active Problems:   E.Coli bacteremia, gram positive bacteremia - Blood culture on the admission so far growing Escherichia coli but gram-positive part not yet identified as which species. - Continue vancomycin. - Repeat blood cultures so far show no growth.     AF (atrial fibrillation) - CHADS2 vasc score at least 3. - Patient is on anticoagulation with apixaban. - Rate controlled with atenolol.    Essential hypertension - Continue atenolol 25 mg at bedtime, Avapro 37.5 mg daily, verapamil  240 mg at bedtime.    Hyperlipidemia, mixed - Continue Crestor 10 mg daily    Hypokalemia - Unclear etiology.  - Repleted and WNL    Thrombocytopenia - Likely secondary to anticoagulation. No reports of bleeding.   DVT Prophylaxis  - On anticoagulation with apixaban twice daily.   Code Status: DNR/DNI  Family Communication:  Family not at the bedside this morning Disposition Plan: likely to SNF once final results of blood culture available.   IV access:  Peripheral IV  Procedures and diagnostic studies:    Dg Chest 2 View 06/28/2014  No active cardiopulmonary disease.   Electronically Signed   By: Kathreen Devoid   On: 06/28/2014 12:32   Dg Shoulder Right 06/28/2014   Negative.   Electronically Signed   By: Margarette Canada M.D.   On: 06/28/2014 12:33   Ct Head Wo Contrast 06/28/2014   1. No evidence of acute intracranial or cervical spine injury. 2. Left parietal scalp swelling without fracture. 3. Right upper lobe airspace disease, correlate for symptoms of aspiration or pneumonia.   Electronically Signed   By: Monte Fantasia M.D.   On: 06/28/2014 12:15   Ct Cervical Spine Wo Contrast 06/28/2014  1. No evidence of acute intracranial or cervical spine injury. 2. Left parietal scalp swelling without fracture. 3. Right upper lobe airspace disease, correlate for symptoms of aspiration or pneumonia.   Electronically Signed   By: Monte Fantasia M.D.   On: 06/28/2014 12:15   Mr Brain Wo Contrast 06/27/2014 1.  No acute intracranial abnormality. 2. Mild for age chronic small vessel disease.   Electronically Signed   By: Genevie Ann M.D.   On: 06/27/2014 14:44  Ct Hip Right Wo Contrast 06/28/2014   1. No hip fracture is identified. MRI offers a high sensitivity for subtle fractures might be considered if the patient is not able to bear weight. 2. No hip effusion observed. 3. I cannot exclude right foraminal impingement at the fused L5-S1 level. 4. Mild axial loss of articular space in the right hip,  likely degenerative.   Electronically Signed   By: Van Clines M.D.   On: 06/28/2014 13:49   Dg Hip Unilat With Pelvis 2-3 Views Right 06/28/2014  1. No acute osseous injury of the right hip. 2. Mild osteoarthritis of bilateral hips, left worse than right.   Electronically Signed   By: Kathreen Devoid   On: 06/28/2014 12:34    Medical Consultants:  None  Other Consultants:  Physical therapy   IAnti-Infectives:   Levaquin 06/28/2014 - 06/29/2014 Vancomycin and cefepime started 06/29/2014 -->   Alexandra Lenz, MD  [facility] Pager [phone]  If 7PM-7AM, please contact night-coverage [email or web address] Password [email or web address] 07/01/2014, 11:15 AM   LOS: 3 days    HPI/Subjective: No acute overnight events.  Objective: Filed Vitals:   06/29/14 2002 06/30/14 0418 06/30/14 2102 07/01/14 0402  BP: 143/76 158/76 141/60 149/85  Pulse: 77 81 79 85  Temp: 99.2 F (37.3 C) 98.1 F (36.7 C) 99.5 F (37.5 C) 98.6 F (37 C)  TempSrc: Oral Oral Oral Oral  Resp: 20 16 17 20   Height:      Weight:      SpO2: 100% 98% 99% 97%    Intake/Output Summary (Last 24 hours) at 07/01/14 1115 Last data filed at 07/01/14 0000  Gross per 24 hour  Intake    360 ml  Output      0 ml  Net    360 ml    Exam:   General:  Pt is alert and awake, no distress  Cardiovascular: Regular rhythm, appreciate S1, S2  Respiratory: Bilateral air entry, no wheezing  Abdomen: Appreciate bowel sounds, no distention and no tenderness on palpation  Extremities: No lower extremity swelling, pulses palpable  Neuro: Nonfocal  Data Reviewed: Basic Metabolic Panel:  Recent Labs Lab 06/27/14 1341 06/28/14 1230 06/29/14 0502  NA 133* 134* 134*  K 3.2* 3.0* 3.7  CL 94* 94* 100  CO2 28 28 25   GLUCOSE 122* 109* 108*  BUN 17 21 28*  CREATININE 0.68 0.61 0.65  CALCIUM 9.8 10.2 9.5   Liver Function Tests:  Recent Labs Lab 06/27/14 1341 06/28/14 1230  AST 25 35  ALT 15 17  ALKPHOS 77 64  BILITOT  0.8 1.1  PROT 7.5 7.3  ALBUMIN 4.0 3.8   No results for input(s): LIPASE, AMYLASE in the last 168 hours. No results for input(s): AMMONIA in the last 168 hours. CBC:  Recent Labs Lab 06/27/14 1341 06/28/14 1230 06/29/14 0502  WBC 9.6 10.1 7.2  NEUTROABS  --  8.3*  --   HGB 13.8 13.1 12.4  HCT 42.0 39.8 37.1  MCV 94.4 93.0 92.8  PLT 126* 134* 109*   Cardiac Enzymes: No results for input(s): CKTOTAL, CKMB, CKMBINDEX, TROPONINI in the last 168 hours. BNP: Invalid input(s): POCBNP CBG: No results for input(s): GLUCAP in the last 168 hours.  Blood Culture    Component Value Date/Time   SDES BLOOD RIGHT HAND 06/30/2014 0900   SPECREQUEST BOTTLES DRAWN AEROBIC AND ANAEROBIC 5CC 06/30/2014 0900   CULT  06/30/2014 0900           BLOOD  CULTURE RECEIVED NO GROWTH TO DATE CULTURE WILL BE HELD FOR 5 DAYS BEFORE ISSUING A FINAL NEGATIVE REPORT Performed at [facility] PENDING 06/30/2014 0900     Scheduled Meds: . apixaban  2.5 mg Oral BID  . atenolol  25 mg Oral QHS  . ceFEPime (MAXIPIME) IV  2 g Intravenous Q12H  . feeding supplement   237 mL Oral BID BM  . fluticasone  1 spray Each Nare QHS  . irbesartan  37.5 mg Oral Daily  . loratadine  10 mg Oral Daily  . multivitamin-lutein  1 capsule Oral Daily  . rosuvastatin  10 mg Oral QODAY  . vancomycin  750 mg Intravenous Q12H  . verapamil  240 mg Oral QHS     9 blood cultures

## 2014-07-01 NOTE — Progress Notes (Signed)
Patient is set to discharge to [facility] today. Patient & daughter, Alexandra Luna aware. Discharge packet given to RN, Sonia Baller. [facility] called for transport.   Clinical Social Work Department CLINICAL SOCIAL WORK PLACEMENT NOTE 07/01/2014  Patient:  Alexandra Luna, Alexandra Luna  Account Number:  [id number] Admit date:  06/28/2014  Clinical Social Worker:  Renold Genta  Date/time:  06/29/2014 01:51 PM  Clinical Social Work is seeking post-discharge placement for this patient at the following level of care:   SKILLED NURSING   (*CSW will update this form in Epic as items are completed)   06/29/2014  Patient/family provided with [place] Department of Clinical Social Work's list of facilities offering this level of care within the geographic area requested by the patient (or if unable, by the patient's family).  06/29/2014  Patient/family informed of their freedom to choose among providers that offer the needed level of care, that participate in Medicare, Medicaid or managed care program needed by the patient, have an available bed and are willing to accept the patient.  06/29/2014  Patient/family informed of [facility]' ownership interest in [facility], as well as of the fact that they are under no obligation to receive care at this facility.  PASARR submitted to EDS on 06/29/2014 PASARR number received on 06/29/2014  FL2 transmitted to all facilities in geographic area requested by pt/family on  06/29/2014 FL2 transmitted to all facilities within larger geographic area on 07/01/2014  Patient informed that his/her managed care company has contracts with or will negotiate with  certain facilities, including the following:     Patient/family informed of bed offers received:  06/29/2014 Patient chooses bed at [facility] Physician recommends and patient chooses bed at    Patient to be transferred to [facility] on  07/01/2014 Patient to be  transferred to facility by [facility] Patient and family notified of transfer on 07/01/2014 Name of family member notified:  patient's daughter, Alexandra Luna via phone  The following physician request were entered in Epic:   Additional Comments:    Raynaldo Opitz, [place] Social Worker cell #: [phone]

## 2014-07-01 NOTE — Progress Notes (Signed)
Report given to Antoinette at [facility].

## 2014-07-01 NOTE — Discharge Summary (Signed)
Physician Discharge Summary  Alexandra Luna:[id number] DOB: 06-25-1926 DOA: 06/27/1977  PCP: Lilian Coma, MD  Admit date: 06/28/2014 Discharge date: 07/01/2014  Recommendations for Outpatient Follow-up:  1. Bactrim and amoxicillin for 7 days on discharge   Discharge Diagnoses:  Principal Problem:   CAP (community acquired pneumonia) Active Problems:   AF (atrial fibrillation)   Essential hypertension   Hyperlipidemia, mixed   Chronic anticoagulation   HTN (hypertension)   Hypokalemia   Generalized weakness   Fall    Discharge Condition: stable   Diet recommendation: as tolerated   History of present illness:   e with a PMH of mild dementia and atrial fib on chronic Eliquis who presented to [facility] hospital with lethargy and worse mental status changes.  Patient underwent extensive evaluation with CT head and CT cervical spine which did not reveal acute fractures. X-ray of the hips, shoulder did not reveal acute fractures. Chest x-ray did not show acute cardiopulmonary findings. Patient was admitted for further evaluation of lethargy and altered mental status. Of note, she had MRI of the brain on 06/27/2014 which did not show acute intracranial findings.  Hospital course is complicated with finding of E.coli and strep bacteremia.  Assessment/Plan:    Principal Problem: Acute respiratory failure with hypoxia / possible lobar pneumonia, community-acquired pneumonia - Initial hypoxia likely secondary to possible lobar pneumonia. Patient initially on Levaquin but because of finding of bacteremia antibiotic regimen changed to vancomycin and Zosyn.  - Respiratory status is stable. Strep pneumonia and legionella are negative.  Active Problems:  E.Coli bacteremia, strep viridans bacteremia - Blood culture on the admission so far growing Escherichia coli and strep viridans - spoke with Dr. Megan Salon of ID, recommended the following regimen: - Will continue bactrim  and amoxicillin for 7 days on discharge - Repeat blood cultures so far show no growth.    AF (atrial fibrillation) - CHADS2 vasc score at least 3. - Patient is on anticoagulation with apixaban. - Rate controlled with atenolol.   Essential hypertension - Continue atenolol 25 mg at bedtime, Avapro 37.5 mg daily, verapamil 240 mg at bedtime.   Hyperlipidemia, mixed - Continue Crestor 10 mg daily   Hypokalemia - Unclear etiology.  - Repleted and WNL   Thrombocytopenia - Likely secondary to anticoagulation. No reports of bleeding.   DVT Prophylaxis  - On anticoagulation with apixaban twice daily.   Code Status: DNR/DNI  Family Communication: Family not at the bedside this morning   IV access:  Peripheral IV  Procedures and diagnostic studies:   Dg Chest 2 View 06/28/2014 No active cardiopulmonary disease. Electronically Signed  By: Kathreen Devoid On: 06/28/2014 12:32   Dg Shoulder Right 06/28/2014 Negative. Electronically Signed By: Margarette Canada M.D. On: 06/28/2014 12:33   Ct Head Wo Contrast 06/28/2014 1. No evidence of acute intracranial or cervical spine injury. 2. Left parietal scalp swelling without fracture. 3. Right upper lobe airspace disease, correlate for symptoms of aspiration or pneumonia. Electronically Signed By: Monte Fantasia M.D. On: 06/28/2014 12:15   Ct Cervical Spine Wo Contrast 06/28/2014 1. No evidence of acute intracranial or cervical spine injury. 2. Left parietal scalp swelling without fracture. 3. Right upper lobe airspace disease, correlate for symptoms of aspiration or pneumonia. Electronically Signed By: Monte Fantasia M.D. On: 06/28/2014 12:15   Mr Brain Wo Contrast 06/27/2014 1. No acute intracranial abnormality. 2. Mild for age chronic small vessel disease. Electronically Signed By: Genevie Ann M.D. On: 06/27/2014 14:44   Ct Hip Right Wo  Contrast 06/28/2014 1. No hip fracture is identified. MRI offers a  high sensitivity for subtle fractures might be considered if the patient is not able to bear weight. 2. No hip effusion observed. 3. I cannot exclude right foraminal impingement at the fused L5-S1 level. 4. Mild axial loss of articular space in the right hip, likely degenerative. Electronically Signed By: Van Clines M.D. On: 06/28/2014 13:49   Dg Hip Unilat With Pelvis 2-3 Views Right 06/28/2014 1. No acute osseous injury of the right hip. 2. Mild osteoarthritis of bilateral hips, left worse than right. Electronically Signed By: Kathreen Devoid On: 06/28/2014 12:34    Medical Consultants:  None  Other Consultants:  Physical therapy   IAnti-Infectives:   Levaquin 06/28/2014 - 06/29/2014 Vancomycin and cefepime started 06/29/2014 --> 07/01/2014   Signed:  Leisa Lenz, MD  [facility] 07/01/2014, 1:25 PM  Pager #: [phone]   Discharge Exam: Filed Vitals:   07/01/14 0402  BP: 149/85  Pulse: 85  Temp: 98.6 F (37 C)  Resp: 20   Filed Vitals:   06/29/14 2002 06/30/14 0418 06/30/14 2102 07/01/14 0402  BP: 143/76 158/76 141/60 149/85  Pulse: 77 81 79 85  Temp: 99.2 F (37.3 C) 98.1 F (36.7 C) 99.5 F (37.5 C) 98.6 F (37 C)  TempSrc: Oral Oral Oral Oral  Resp: 20 16 17 20   Height:      Weight:      SpO2: 79% 79% 79% 79%    General: Pt is alert, follows commands appropriately, not in acute distress Cardiovascular: Regular rate and rhythm, S1/S2 + Respiratory: Clear to auscultation bilaterally, no wheezing, no crackles, no rhonchi Abdominal: Soft, non tender, non distended, bowel sounds +, no guarding Extremities: no edema, no cyanosis, pulses palpable bilaterally DP and PT Neuro: Grossly nonfocal  Discharge Instructions  Discharge Instructions    Call MD for:  difficulty breathing, headache or visual disturbances    Complete by:  As directed      Call MD for:  persistant nausea and vomiting    Complete by:  As directed       Call MD for:  severe uncontrolled pain    Complete by:  As directed      Diet - low sodium heart healthy    Complete by:  As directed      Increase activity slowly    Complete by:  As directed             Medication List    STOP taking these medications        ALPRAZolam 0.25 MG tablet  Commonly known as:  XANAX      TAKE these medications        acetaminophen 500 MG tablet  Commonly known as:  TYLENOL  Take 1,000 mg by mouth at bedtime.     amoxicillin 875 MG tablet  Commonly known as:  AMOXIL  Take 1 tablet (875 mg total) by mouth 2 (two) times daily.     apixaban 2.5 MG Tabs tablet  Commonly known as:  ELIQUIS  Take 1 tablet (2.5 mg total) by mouth 2 (two) times daily.     atenolol 25 MG tablet  Commonly known as:  TENORMIN  Take 1 tablet (25 mg total) by mouth at bedtime.     BONINE PO  Take 0.5-1 tablets by mouth daily as needed (dizziness).     calcium carbonate 600 MG Tabs tablet  Commonly known as:  OS-CAL  Take 600 mg by  mouth 2 (two) times daily with a meal.     feeding supplement (ENSURE ENLIVE) Liqd  Take 237 mLs by mouth 2 (two) times daily between meals.     fluticasone 50 MCG/ACT nasal spray  Commonly known as:  FLONASE  Place 1 spray into both nostrils at bedtime.     hydrochlorothiazide 25 MG tablet  Commonly known as:  HYDRODIURIL  Take 1 tablet (25 mg total) by mouth daily.     ICAPS MV Tabs  Take 3-5 tablets by mouth daily.     loratadine 10 MG tablet  Commonly known as:  CLARITIN  Take 10 mg by mouth daily.     rosuvastatin 10 MG tablet  Commonly known as:  CRESTOR  Take 1 tablet (10 mg total) by mouth every other day.     valsartan 40 MG tablet  Commonly known as:  DIOVAN  Take 1 tablet (40 mg total) by mouth daily.     verapamil 240 MG 24 hr capsule  Commonly known as:  VERELAN PM  Take 1 capsule (240 mg total) by mouth at bedtime.           Follow-up Information    Follow up with Lilian Coma, MD. Schedule an  appointment as soon as possible for a visit in 1 week.   Specialty:  Family Medicine   Why:  Follow up appt after recent hospitalization   Contact information:   [address] [place] [address] [phone]        The results of significant diagnostics from this hospitalization (including imaging, microbiology, ancillary and laboratory) are listed below for reference.    Significant Diagnostic Studies: Dg Chest 2 View  06/28/2014   CLINICAL DATA:  Status post fall. Hematoma on the posterior right shoulder.  EXAM: CHEST  2 VIEW  COMPARISON:  01/05/2014  FINDINGS: There is no focal parenchymal opacity, pleural effusion, or pneumothorax. There is stable cardiomegaly.  There are surgical clips projecting over the left axilla.  The osseous structures are unremarkable.  IMPRESSION: No active cardiopulmonary disease.   Electronically Signed   By: Kathreen Devoid   On: 06/28/2014 12:32   Dg Shoulder Right  06/28/2014   CLINICAL DATA:  Acute fall today with right shoulder pain and swelling.  EXAM: RIGHT SHOULDER - 2+ VIEW  COMPARISON:  None.  FINDINGS: There is no evidence of fracture or dislocation. There is no evidence of arthropathy or other focal bone abnormality. Soft tissues are unremarkable.  IMPRESSION: Negative.   Electronically Signed   By: Margarette Canada M.D.   On: 06/28/2014 12:33   Ct Head Wo Contrast  06/28/2014   CLINICAL DATA:  Fall with increasing weakness and confusion.  EXAM: CT HEAD WITHOUT CONTRAST  CT CERVICAL SPINE WITHOUT CONTRAST  TECHNIQUE: Multidetector CT imaging of the head and cervical spine was performed following the standard protocol without intravenous contrast. Multiplanar CT image reconstructions of the cervical spine were also generated.  COMPARISON:  11/03/2007 neck CT. brain MRI from yesterday.  FINDINGS: CT HEAD FINDINGS  Skull and Sinuses:Mild left parietal scalp swelling. There is no underlying calvarial fracture.  The sinuses and mastoids are  aerated.  Orbits: Bilateral cataract resection.  No traumatic findings  Brain: No evidence of acute infarction, hemorrhage, hydrocephalus, or mass lesion/mass effect. Chronic small-vessel disease with ischemic gliosis around the lateral ventricles. 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 The cerebral volume is within normal limits for age.  CT  CERVICAL SPINE FINDINGS  No evidence of acute fracture or traumatic malalignment. There is multilevel chronic anterolisthesis associated with advanced facet arthropathy, especially prominent at C3-4 and C4-5 where there is 3 mm of slip. Degenerative disc disease which is focally advanced at C5-6. Uncovertebral spurs at this level cause prominent bilateral foraminal stenosis. No gross cervical canal hematoma or prevertebral edema.  IMPRESSION: 1. No evidence of acute intracranial or cervical spine injury. 2. Left parietal scalp swelling without fracture. 3. Right upper lobe airspace disease, correlate for symptoms of aspiration or pneumonia.   Electronically Signed   By: Monte Fantasia M.D.   On: 06/28/2014 12:15   Ct Cervical Spine Wo Contrast  06/28/2014   CLINICAL DATA:  Fall with increasing weakness and confusion.  EXAM: CT HEAD WITHOUT CONTRAST  CT CERVICAL SPINE WITHOUT CONTRAST  TECHNIQUE: Multidetector CT imaging of the head and cervical spine was performed following the standard protocol without intravenous contrast. Multiplanar CT image reconstructions of the cervical spine were also generated.  COMPARISON:  11/03/2007 neck CT. brain MRI from yesterday.  FINDINGS: CT HEAD FINDINGS  Skull and Sinuses:Mild left parietal scalp swelling. There is no underlying calvarial fracture.  The sinuses and mastoids are aerated.  Orbits: Bilateral cataract resection.  No traumatic findings  Brain: No evidence of acute infarction, hemorrhage, hydrocephalus, or mass lesion/mass effect. Chronic small-vessel disease with ischemic gliosis around the  lateral ventricles. 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 The cerebral volume is within normal limits for age.  CT CERVICAL SPINE FINDINGS  No evidence of acute fracture or traumatic malalignment. There is multilevel chronic anterolisthesis associated with advanced facet arthropathy, especially prominent at C3-4 and C4-5 where there is 3 mm of slip. Degenerative disc disease which is focally advanced at C5-6. Uncovertebral spurs at this level cause prominent bilateral foraminal stenosis. No gross cervical canal hematoma or prevertebral edema.  IMPRESSION: 1. No evidence of acute intracranial or cervical spine injury. 2. Left parietal scalp swelling without fracture. 3. Right upper lobe airspace disease, correlate for symptoms of aspiration or pneumonia.   Electronically Signed   By: Monte Fantasia M.D.   On: 06/28/2014 12:15   Mr Brain Wo Contrast  06/27/2014   CLINICAL DATA:  81 year old female with altered mental status today, recent flank pain. Initial encounter.  EXAM: MRI HEAD WITHOUT CONTRAST  TECHNIQUE: Multiplanar, multiecho pulse sequences of the brain and surrounding structures were obtained without intravenous contrast.  COMPARISON:  Neck CT 11/03/2007.  FINDINGS: Cerebral volume is within normal limits for age. No restricted diffusion to suggest acute infarction. No midline shift, mass effect, evidence of mass lesion, ventriculomegaly, extra-axial collection or acute intracranial hemorrhage. Cervicomedullary junction and pituitary are within normal limits. Negative visualized cervical spine. Major intracranial vascular flow voids are within normal limits.  Small chronic lacunar infarcts in the inferior right cerebellum. Small probable perivascular spaces at the junction of the midbrain and pons, less likely small chronic lacunae. Deep gray matter nuclei similarly are within normal limits for age. Minimal to mild for age nonspecific cerebral white matter T2 and FLAIR  hyperintensity. No cortical encephalomalacia. No chronic blood products identified in the brain.  Visible internal auditory structures appear normal. Mastoids are clear. Trace paranasal sinus mucosal thickening. Postoperative changes to the globes. Visualized scalp soft tissues are within normal limits. Incidental small right nasopharyngeal retention cyst (series 6, image 5). Normal bone marrow signal.  IMPRESSION: 1.  No acute intracranial abnormality. 2. Mild for age chronic small  vessel disease.   Electronically Signed   By: Genevie Ann M.D.   On: 06/27/2014 14:44   Ct Hip Right Wo Contrast  06/28/2014   CLINICAL DATA:  Fall. Increased confusion and weakness. Right hip pain.  EXAM: CT OF THE RIGHT HIP WITHOUT CONTRAST  TECHNIQUE: Multidetector CT imaging of the right hip was performed according to the standard protocol. Multiplanar CT image reconstructions were also generated.  COMPARISON:  06/28/2014 radiographs  FINDINGS: Degenerative spurring of the right femoral head with mild axial loss of articular space. Probably chronically fragmented spur along the greater trochanteric margin posteriorly I do not see a discrete fracture. There appears to be solid interbody fusion at L5-S1 and right foraminal impingement at this level is not excluded.  IMPRESSION: 1. No hip fracture is identified. MRI offers a high sensitivity for subtle fractures might be considered if the patient is not able to bear weight. 2. No hip effusion observed. 3. I cannot exclude right foraminal impingement at the fused L5-S1 level. 4. Mild axial loss of articular space in the right hip, likely degenerative.   Electronically Signed   By: Van Clines M.D.   On: 06/28/2014 13:49   Mm Digital Screening Bilateral  06/17/2014   CLINICAL DATA:  Screening.  EXAM: DIGITAL SCREENING BILATERAL MAMMOGRAM WITH CAD  COMPARISON:  Previous exam(s).  ACR Breast Density Category c: The breast tissue is heterogeneously dense, which may obscure small  masses.  FINDINGS: There are no findings suspicious for malignancy. Images were processed with CAD.  IMPRESSION: No mammographic evidence of malignancy. A result letter of this screening mammogram will be mailed directly to the patient.  RECOMMENDATION: Screening mammogram in one year. (Code:SM-B-01Y)  BI-RADS CATEGORY  1: Negative.   Electronically Signed   By: Nolon Nations M.D.   On: 06/17/2014 17:04   Dg Hip Unilat With Pelvis 2-3 Views Right  06/28/2014   CLINICAL DATA:  Status post fall.  Right hip pain.  EXAM: RIGHT HIP (WITH PELVIS) 2-3 VIEWS  COMPARISON:  None.  FINDINGS: No acute fracture or dislocation. Mild osteoarthritis of the right hip. Small areas of heterotopic ossification adjacent to the medial aspect of the right greater trochanter.  Mild osteoarthritis of the left hip.  IMPRESSION: 1. No acute osseous injury of the right hip. 2. Mild osteoarthritis of bilateral hips, left worse than right.   Electronically Signed   By: Kathreen Devoid   On: 06/28/2014 12:34    Microbiology: Recent Results (from the past 240 hour(s))  Culture, blood (routine x 2) Call MD if unable to obtain prior to antibiotics being given     Status: None (Preliminary result)   Collection Time: 06/28/14  4:31 PM  Result Value Ref Range Status   Specimen Description BLOOD RIGHT HAND  Final   Special Requests AEB 2.5CC  Final   Culture   Final    VIRIDANS STREPTOCOCCUS Note: Gram Stain Report Called to,Read Back By and Verified With: KATIE DEASON 06/29/14 1400 BY [email or web address] Performed at [facility]    Report Status PENDING  Incomplete  Culture, blood (routine x 2) Call MD if unable to obtain prior to antibiotics being given     Status: None (Preliminary result)   Collection Time: 06/28/14  4:36 PM  Result Value Ref Range Status   Specimen Description BLOOD RIGHT HAND  Final   Special Requests AEB 2CC  Final   Culture   Final    ESCHERICHIA COLI GRAM POSITIVE COCCI IN CHAINS  Note: Gram Stain Report  Called to,Read Back By and Verified With: KATIE DEASON 06/29/14 0837 BY [email or web address] Performed at [facility]    Report Status PENDING  Incomplete   Organism ID, Bacteria ESCHERICHIA COLI  Final      Susceptibility   Escherichia coli - MIC*    AMPICILLIN >=32 RESISTANT Resistant     AMPICILLIN/SULBACTAM >=32 RESISTANT Resistant     CEFAZOLIN 8 SENSITIVE Sensitive     CEFEPIME <=1 SENSITIVE Sensitive     CEFTAZIDIME <=1 SENSITIVE Sensitive     CEFTRIAXONE <=1 SENSITIVE Sensitive     CIPROFLOXACIN >=4 RESISTANT Resistant     GENTAMICIN <=1 SENSITIVE Sensitive     IMIPENEM <=0.25 SENSITIVE Sensitive     PIP/TAZO 64 INTERMEDIATE Intermediate     TOBRAMYCIN <=1 SENSITIVE Sensitive     TRIMETH/SULFA <=20 SENSITIVE Sensitive     * ESCHERICHIA COLI  Culture, blood (routine x 2)     Status: None (Preliminary result)   Collection Time: 06/30/14  8:50 AM  Result Value Ref Range Status   Specimen Description BLOOD RIGHT ARM  Final   Special Requests BOTTLES DRAWN AEROBIC AND ANAEROBIC 10CC EACH  Final   Culture   Final           BLOOD CULTURE RECEIVED NO GROWTH TO DATE CULTURE WILL BE HELD FOR 5 DAYS BEFORE ISSUING A FINAL NEGATIVE REPORT Performed at [facility]    Report Status PENDING  Incomplete  Culture, blood (routine x 2)     Status: None (Preliminary result)   Collection Time: 06/30/14  9:00 AM  Result Value Ref Range Status   Specimen Description BLOOD RIGHT HAND  Final   Special Requests BOTTLES DRAWN AEROBIC AND ANAEROBIC 5CC  Final   Culture   Final           BLOOD CULTURE RECEIVED NO GROWTH TO DATE CULTURE WILL BE HELD FOR 5 DAYS BEFORE ISSUING A FINAL NEGATIVE REPORT Performed at [facility]    Report Status PENDING  Incomplete     Labs: Basic Metabolic Panel:  Recent Labs Lab 06/27/14 1341 06/28/14 1230 06/29/14 0502  NA 133* 134* 134*  K 3.2* 3.0* 3.7  CL 94* 94* 100  CO2 28 28 25   GLUCOSE 122* 109* 108*  BUN 17 21 28*  CREATININE  0.68 0.61 0.65  CALCIUM 9.8 10.2 9.5   Liver Function Tests:  Recent Labs Lab 06/27/14 1341 06/28/14 1230  AST 25 35  ALT 15 17  ALKPHOS 77 64  BILITOT 0.8 1.1  PROT 7.5 7.3  ALBUMIN 4.0 3.8   No results for input(s): LIPASE, AMYLASE in the last 168 hours. No results for input(s): AMMONIA in the last 168 hours. CBC:  Recent Labs Lab 06/27/14 1341 06/28/14 1230 06/29/14 0502  WBC 9.6 10.1 7.2  NEUTROABS  --  8.3*  --   HGB 13.8 13.1 12.4  HCT 42.0 39.8 37.1  MCV 94.4 93.0 92.8  PLT 126* 134* 109*   Cardiac Enzymes: No results for input(s): CKTOTAL, CKMB, CKMBINDEX, TROPONINI in the last 168 hours. BNP: BNP (last 3 results) No results for input(s): BNP in the last 8760 hours.  ProBNP (last 3 results) No results for input(s): PROBNP in the last 8760 hours.  CBG: No results for input(s): GLUCAP in the last 168 hours.  Time coordinating discharge: Over 30 minutes

## 2014-07-01 NOTE — Progress Notes (Signed)
Patient tolerated amoxicillin without trouble. Pt transport called.

## 2014-07-01 NOTE — Progress Notes (Signed)
Patient staed that she did not want to take her medications with applesauce because it was too sweet. She said she preferred ice water. Sula Rumple was given 5 oral tablets with ice water. She took them one at a time and had no difficulty swallowing.

## 2014-07-01 NOTE — Progress Notes (Addendum)
ANTIBIOTIC CONSULT NOTE - FOLLOW-UP  Pharmacy Consult for Vancomycin, Cefepime Indication: Bacteremia  Allergies  Allergen Reactions  . Influenza Vaccines Other (See Comments)    Pt's arm swelled and dr told her not to take vaccine again.  Preston Fleeting [Trandolapril-Verapamil Hcl Er] Other (See Comments)    Increase blood pressure  . Adhesive [Tape] Rash    Adhesive bandaids   . Azithromycin Rash  . Lipitor [Atorvastatin] Rash  . Penicillins Rash    Patient Measurements: Height: 5\' 6"  (167.6 cm) Weight: 172 lb 3.2 oz (78.109 kg) IBW/kg (Calculated) : 59.3  Vital Signs: Temp: 98.6 F (37 C) (03/31 0402) Temp Source: Oral (03/31 0402) BP: 149/85 mmHg (03/31 0402) Pulse Rate: 85 (03/31 0402) Intake/Output from previous day: 03/30 0701 - 03/31 0700 In: 600 [P.O.:360; IV Piggyback:200] Out: -  Intake/Output from this shift:    Labs:  Recent Labs  06/29/14 0502  WBC 7.2  HGB 12.4  PLT 109*  CREATININE 0.65   Estimated Creatinine Clearance: 54.2 mL/min (by C-G formula based on Cr of 0.65).  Recent Labs  07/01/14 1044  VANCOTROUGH 10.4     Microbiology: Recent Results (from the past 720 hour(s))  Culture, blood (routine x 2) Call MD if unable to obtain prior to antibiotics being given     Status: None (Preliminary result)   Collection Time: 06/28/14  4:31 PM  Result Value Ref Range Status   Specimen Description BLOOD RIGHT HAND  Final   Special Requests AEB 2.5CC  Final   Culture   Final    VIRIDANS STREPTOCOCCUS Note: Gram Stain Report Called to,Read Back By and Verified With: KATIE DEASON 06/29/14 1400 BY SMITHERSJ Performed at [facility]    Report Status PENDING  Incomplete  Culture, blood (routine x 2) Call MD if unable to obtain prior to antibiotics being given     Status: None (Preliminary result)   Collection Time: 06/28/14  4:36 PM  Result Value Ref Range Status   Specimen Description BLOOD RIGHT HAND  Final   Special Requests AEB 2CC  Final    Culture   Final    ESCHERICHIA COLI GRAM POSITIVE COCCI IN CHAINS Note: Gram Stain Report Called to,Read Back By and Verified With: KATIE DEASON 06/29/14 0837 BY SMITHERSJ Performed at [facility]    Report Status PENDING  Incomplete   Organism ID, Bacteria ESCHERICHIA COLI  Final      Susceptibility   Escherichia coli - MIC*    AMPICILLIN >=32 RESISTANT Resistant     AMPICILLIN/SULBACTAM >=32 RESISTANT Resistant     CEFAZOLIN 8 SENSITIVE Sensitive     CEFEPIME <=1 SENSITIVE Sensitive     CEFTAZIDIME <=1 SENSITIVE Sensitive     CEFTRIAXONE <=1 SENSITIVE Sensitive     CIPROFLOXACIN >=4 RESISTANT Resistant     GENTAMICIN <=1 SENSITIVE Sensitive     IMIPENEM <=0.25 SENSITIVE Sensitive     PIP/TAZO 64 INTERMEDIATE Intermediate     TOBRAMYCIN <=1 SENSITIVE Sensitive     TRIMETH/SULFA <=20 SENSITIVE Sensitive     * ESCHERICHIA COLI  Culture, blood (routine x 2)     Status: None (Preliminary result)   Collection Time: 06/30/14  8:50 AM  Result Value Ref Range Status   Specimen Description BLOOD RIGHT ARM  Final   Special Requests BOTTLES DRAWN AEROBIC AND ANAEROBIC 10CC EACH  Final   Culture   Final           BLOOD CULTURE RECEIVED NO GROWTH TO DATE  CULTURE WILL BE HELD FOR 5 DAYS BEFORE ISSUING A FINAL NEGATIVE REPORT Performed at [facility]    Report Status PENDING  Incomplete  Culture, blood (routine x 2)     Status: None (Preliminary result)   Collection Time: 06/30/14  9:00 AM  Result Value Ref Range Status   Specimen Description BLOOD RIGHT HAND  Final   Special Requests BOTTLES DRAWN AEROBIC AND ANAEROBIC 5CC  Final   Culture   Final           BLOOD CULTURE RECEIVED NO GROWTH TO DATE CULTURE WILL BE HELD FOR 5 DAYS BEFORE ISSUING A FINAL NEGATIVE REPORT Performed at [facility]    Report Status PENDING  Incomplete    Medical History: Past Medical History  Diagnosis Date  . Renal hypertension 03/07/2005    no evidence of significant  diameter reduction, dissection, tortuosity, FMD or orther vascular abnormality; kidneys equal in size, symmetry, with normal cortex and medulla  . Palpitations   . Mixed hyperlipidemia   . Anxiety   . Atrial fibrillation     On chronic Eliquis  . Breast cancer dx'd 1997    left; lt lumpectomy and XRT  . Pre-diabetes     Assessment: 82 y/oF with PMH of a-fib, HLD, anxiety, breast cancer, mild dementia who presented to [facility] ED with lethargy and AMS. 1/2 blood cx 3/28 showed gram negative rods and gram positive cocci in chains. Patient initially placed on Levofloxacin, now changed to Vancomycin and Cefepime for bacteremia with pharmacy requested to assist with dosing.  3/28 >> Levofloxacin >> 3/29 3/29 >> Vancomycin >>  3/29 >> Cefepime >>    Tmax: 99.5 WBCs: WNL, 7.2 (3/29) Renal: SCr 0.65 (3/29), CrCl ~ 54 mL/min CG  3/28 blood: 1/2 with E.coli (see sensitivities above) and gram positive cocci in chains, 1/2 with Strep viridans (susc pending) 3/30 blood x 2: NGTD   Goal of Therapy:  Vancomycin trough level 15-20 mcg/ml  Appropriate antibiotic dosing for renal function and indication Eradication of infection  Plan:   Increase Vancomycin to 1250 mg IV q12h per subtherapeutic trough level above.  Plan for Vancomycin trough level at new steady state, as indicated.  Continue Cefepime 2g IV q12h.  BMET in AM.  Monitor renal function, cultures, clinical course.   Lindell Spar, PharmD, BCPS Pager: [phone] 07/01/2014 12:58 PM   Addendum:   See Dr. Fortino Sic discharge summary for change in antibiotic therapy.  Lindell Spar, PharmD, BCPS Pager: [phone] 07/01/2014 2:19 PM

## 2014-07-01 NOTE — Discharge Instructions (Signed)
Bacteremia °Bacteremia occurs when bacteria get in your blood. Normal blood does not usually have bacteria. Bacteremia is one way infections can spread from one part of the body to another. °CAUSES  °· Causes may include anything that allows bacteria to get into the body. Examples are: °¨ Catheters. °¨ Intravenous (IV) access tubes. °¨ Cuts or scrapes of the skin. °· Temporary bacteremia may occur during dental procedures, while brushing your teeth, or during a bowel movement. This rarely causes any symptoms or medical problems. °· Bacteria may also get in the bloodstream as a complication of a bacterial infection elsewhere. This includes infected wounds and bacterial infections of the: °¨ Lungs (pneumonia). °¨ Kidneys (pyelonephritis). °¨ Intestines (enteritis, colitis). °¨ Organs in the abdomen (appendicitis, cholecystitis, diverticulitis). °SYMPTOMS  °The body is usually able to clear small numbers of bacteria out of the blood quickly. Brief bacteremia usually does not cause problems.  °· Problems can occur if the bacteria start to grow in number or spread to other parts of the body. If the bacteria start growing, you may develop: °¨ Chills. °¨ Fever. °¨ Nausea. °¨ Vomiting. °¨ Sweating. °¨ Lightheadedness and low blood pressure. °¨ Pain. °· If bacteria start to grow in the linings around the brain, it is called meningitis. This can cause severe headaches, many other problems, and even death. °· If bacteria start to grow in a joint, it causes arthritis with painful joints. If bacteria start to grow in a bone, it is called osteomyelitis. °· Bacteria from the blood can also cause sores (abscesses) in many organs, such as the muscle, liver, spleen, lungs, brain, and kidneys. °DIAGNOSIS  °· This condition is diagnosed by cultures of the blood. °· Cultures may also be taken from other parts of the body that are thought to be causing the bacteremia. A small piece of tissue, fluid, or other product of the body is  sampled. The sample is then put on a growth plate to see if any bacteria grows. °· Other lab tests may be done and the results may be abnormal. °TREATMENT  °Treatment requires a stay in the hospital. You will be given antibiotic medicine through an IV access tube. °PREVENTION  °People with an increased risk of developing bacteremia or complications may be given antibiotics before certain procedures. Examples are: °· A person with a heart murmur or artificial heart valve, before having his or her teeth cleaned. °· Before having a surgical or other invasive procedure. °· Before having a bowel procedure. °Document Released: 12/31/2005 Document Revised: 06/11/2011 Document Reviewed: 10/12/2010 °ExitCare® Patient Information ©2015 [employer]. This information is not intended to replace advice given to you by your health care provider. Make sure you discuss any questions you have with your health care provider. ° °

## 2014-07-04 LAB — CULTURE, BLOOD (ROUTINE X 2)

## 2014-07-06 ENCOUNTER — Telehealth: Payer: Self-pay | Admitting: Internal Medicine

## 2014-07-06 LAB — CULTURE, BLOOD (ROUTINE X 2)
CULTURE: NO GROWTH
Culture: NO GROWTH

## 2014-07-06 NOTE — Telephone Encounter (Signed)
pt daughter called to cx appt....pt will call back to r/s

## 2014-07-08 ENCOUNTER — Other Ambulatory Visit: Payer: Medicare Other

## 2014-07-15 ENCOUNTER — Ambulatory Visit: Payer: Medicare Other | Admitting: Internal Medicine

## 2014-08-14 ENCOUNTER — Emergency Department (HOSPITAL_COMMUNITY): Payer: Medicare Other

## 2014-08-14 ENCOUNTER — Encounter (HOSPITAL_COMMUNITY): Payer: Self-pay | Admitting: *Deleted

## 2014-08-14 ENCOUNTER — Inpatient Hospital Stay (HOSPITAL_COMMUNITY)
Admission: EM | Admit: 2014-08-14 | Discharge: 2014-08-15 | DRG: 291 | Disposition: A | Discharge status: Skilled Nursing Facility | Payer: Medicare Other | Attending: Internal Medicine | Admitting: Internal Medicine

## 2014-08-14 DIAGNOSIS — J90 Pleural effusion, not elsewhere classified: Secondary | ICD-10-CM | POA: Diagnosis not present

## 2014-08-14 DIAGNOSIS — I10 Essential (primary) hypertension: Secondary | ICD-10-CM | POA: Diagnosis present

## 2014-08-14 DIAGNOSIS — Z853 Personal history of malignant neoplasm of breast: Secondary | ICD-10-CM | POA: Diagnosis not present

## 2014-08-14 DIAGNOSIS — I129 Hypertensive chronic kidney disease with stage 1 through stage 4 chronic kidney disease, or unspecified chronic kidney disease: Secondary | ICD-10-CM | POA: Diagnosis present

## 2014-08-14 DIAGNOSIS — Z9109 Other allergy status, other than to drugs and biological substances: Secondary | ICD-10-CM | POA: Diagnosis not present

## 2014-08-14 DIAGNOSIS — Z888 Allergy status to other drugs, medicaments and biological substances status: Secondary | ICD-10-CM

## 2014-08-14 DIAGNOSIS — Z66 Do not resuscitate: Secondary | ICD-10-CM | POA: Diagnosis present

## 2014-08-14 DIAGNOSIS — Z880 Allergy status to penicillin: Secondary | ICD-10-CM | POA: Diagnosis not present

## 2014-08-14 DIAGNOSIS — I4891 Unspecified atrial fibrillation: Secondary | ICD-10-CM | POA: Diagnosis present

## 2014-08-14 DIAGNOSIS — Z887 Allergy status to serum and vaccine status: Secondary | ICD-10-CM

## 2014-08-14 DIAGNOSIS — N189 Chronic kidney disease, unspecified: Secondary | ICD-10-CM | POA: Diagnosis present

## 2014-08-14 DIAGNOSIS — Z79899 Other long term (current) drug therapy: Secondary | ICD-10-CM

## 2014-08-14 DIAGNOSIS — R911 Solitary pulmonary nodule: Secondary | ICD-10-CM

## 2014-08-14 DIAGNOSIS — Z881 Allergy status to other antibiotic agents status: Secondary | ICD-10-CM | POA: Diagnosis not present

## 2014-08-14 DIAGNOSIS — Z803 Family history of malignant neoplasm of breast: Secondary | ICD-10-CM | POA: Diagnosis not present

## 2014-08-14 DIAGNOSIS — Z823 Family history of stroke: Secondary | ICD-10-CM | POA: Diagnosis not present

## 2014-08-14 DIAGNOSIS — I5033 Acute on chronic diastolic (congestive) heart failure: Secondary | ICD-10-CM | POA: Diagnosis not present

## 2014-08-14 DIAGNOSIS — R0602 Shortness of breath: Secondary | ICD-10-CM | POA: Diagnosis present

## 2014-08-14 DIAGNOSIS — E782 Mixed hyperlipidemia: Secondary | ICD-10-CM | POA: Diagnosis present

## 2014-08-14 DIAGNOSIS — Z7901 Long term (current) use of anticoagulants: Secondary | ICD-10-CM | POA: Diagnosis not present

## 2014-08-14 DIAGNOSIS — J9601 Acute respiratory failure with hypoxia: Secondary | ICD-10-CM | POA: Diagnosis present

## 2014-08-14 DIAGNOSIS — J910 Malignant pleural effusion: Secondary | ICD-10-CM | POA: Diagnosis present

## 2014-08-14 DIAGNOSIS — Z87891 Personal history of nicotine dependence: Secondary | ICD-10-CM | POA: Diagnosis not present

## 2014-08-14 DIAGNOSIS — I5021 Acute systolic (congestive) heart failure: Secondary | ICD-10-CM

## 2014-08-14 DIAGNOSIS — Z8249 Family history of ischemic heart disease and other diseases of the circulatory system: Secondary | ICD-10-CM | POA: Diagnosis not present

## 2014-08-14 DIAGNOSIS — I5031 Acute diastolic (congestive) heart failure: Secondary | ICD-10-CM | POA: Diagnosis present

## 2014-08-14 DIAGNOSIS — I509 Heart failure, unspecified: Secondary | ICD-10-CM

## 2014-08-14 LAB — I-STAT CHEM 8, ED
BUN: 16 mg/dL (ref 6–20)
CALCIUM ION: 1.25 mmol/L (ref 1.13–1.30)
Chloride: 103 mmol/L (ref 101–111)
Creatinine, Ser: 0.5 mg/dL (ref 0.44–1.00)
Glucose, Bld: 121 mg/dL — ABNORMAL HIGH (ref 65–99)
HCT: 38 % (ref 36.0–46.0)
Hemoglobin: 12.9 g/dL (ref 12.0–15.0)
Potassium: 3.5 mmol/L (ref 3.5–5.1)
Sodium: 138 mmol/L (ref 135–145)
TCO2: 22 mmol/L (ref 0–100)

## 2014-08-14 LAB — CBC WITH DIFFERENTIAL/PLATELET
BASOS ABS: 0 10*3/uL (ref 0.0–0.1)
Basophils Relative: 0 % (ref 0–1)
EOS PCT: 1 % (ref 0–5)
Eosinophils Absolute: 0.1 10*3/uL (ref 0.0–0.7)
HEMATOCRIT: 36.1 % (ref 36.0–46.0)
Hemoglobin: 11.7 g/dL — ABNORMAL LOW (ref 12.0–15.0)
Lymphocytes Relative: 10 % — ABNORMAL LOW (ref 12–46)
Lymphs Abs: 0.9 10*3/uL (ref 0.7–4.0)
MCH: 31.1 pg (ref 26.0–34.0)
MCHC: 32.4 g/dL (ref 30.0–36.0)
MCV: 96 fL (ref 78.0–100.0)
Monocytes Absolute: 0.8 10*3/uL (ref 0.1–1.0)
Monocytes Relative: 10 % (ref 3–12)
Neutro Abs: 6.8 10*3/uL (ref 1.7–7.7)
Neutrophils Relative %: 79 % — ABNORMAL HIGH (ref 43–77)
PLATELETS: 156 10*3/uL (ref 150–400)
RBC: 3.76 MIL/uL — ABNORMAL LOW (ref 3.87–5.11)
RDW: 16.1 % — AB (ref 11.5–15.5)
WBC: 8.6 10*3/uL (ref 4.0–10.5)

## 2014-08-14 LAB — I-STAT TROPONIN, ED: Troponin i, poc: 0.01 ng/mL (ref 0.00–0.08)

## 2014-08-14 LAB — BRAIN NATRIURETIC PEPTIDE: B NATRIURETIC PEPTIDE 5: 373.4 pg/mL — AB (ref 0.0–100.0)

## 2014-08-14 LAB — MRSA PCR SCREENING: MRSA BY PCR: NEGATIVE

## 2014-08-14 MED ORDER — FUROSEMIDE 10 MG/ML IJ SOLN
40.0000 mg | Freq: Once | INTRAMUSCULAR | Status: AC
  Administered 2014-08-14: 40 mg via INTRAVENOUS
  Filled 2014-08-14: qty 4

## 2014-08-14 MED ORDER — SODIUM CHLORIDE 0.9 % IV SOLN: INTRAVENOUS | Status: DC

## 2014-08-14 MED ORDER — ROSUVASTATIN CALCIUM 10 MG PO TABS
10.0000 mg | ORAL_TABLET | Freq: Every day | ORAL | Status: DC
  Administered 2014-08-14: 10 mg via ORAL
  Filled 2014-08-14: qty 1

## 2014-08-14 MED ORDER — CALCIUM CARBONATE 1250 (500 CA) MG PO TABS
1.0000 | ORAL_TABLET | Freq: Two times a day (BID) | ORAL | Status: DC
  Administered 2014-08-14 – 2014-08-15 (×2): 500 mg via ORAL
  Filled 2014-08-14 (×3): qty 1

## 2014-08-14 MED ORDER — ONDANSETRON HCL 4 MG/2ML IJ SOLN: 4.0000 mg | Freq: Four times a day (QID) | INTRAMUSCULAR | Status: DC | PRN

## 2014-08-14 MED ORDER — SODIUM CHLORIDE 0.9 % IJ SOLN
3.0000 mL | Freq: Two times a day (BID) | INTRAMUSCULAR | Status: DC
Start: 2014-08-14 — End: 2014-08-15
  Administered 2014-08-14 – 2014-08-15 (×3): 3 mL via INTRAVENOUS

## 2014-08-14 MED ORDER — LORATADINE 10 MG PO TABS
10.0000 mg | ORAL_TABLET | Freq: Every day | ORAL | Status: DC
  Administered 2014-08-14 – 2014-08-15 (×2): 10 mg via ORAL
  Filled 2014-08-14 (×3): qty 1

## 2014-08-14 MED ORDER — VERAPAMIL HCL ER 240 MG PO TBCR
240.0000 mg | EXTENDED_RELEASE_TABLET | Freq: Every day | ORAL | Status: DC
  Administered 2014-08-14: 240 mg via ORAL
  Filled 2014-08-14: qty 1

## 2014-08-14 MED ORDER — IOHEXOL 350 MG/ML SOLN
100.0000 mL | Freq: Once | INTRAVENOUS | Status: AC | PRN
  Administered 2014-08-14: 100 mL via INTRAVENOUS

## 2014-08-14 MED ORDER — ACETAMINOPHEN 500 MG PO TABS
1000.0000 mg | ORAL_TABLET | Freq: Every day | ORAL | Status: DC
  Administered 2014-08-14: 1000 mg via ORAL
  Filled 2014-08-14 (×2): qty 2

## 2014-08-14 MED ORDER — ATENOLOL 25 MG PO TABS
25.0000 mg | ORAL_TABLET | Freq: Every day | ORAL | Status: DC
  Administered 2014-08-14: 25 mg via ORAL
  Filled 2014-08-14: qty 1

## 2014-08-14 MED ORDER — MORPHINE SULFATE 2 MG/ML IJ SOLN: 1.0000 mg | INTRAMUSCULAR | Status: DC | PRN

## 2014-08-14 MED ORDER — APIXABAN 2.5 MG PO TABS
2.5000 mg | ORAL_TABLET | Freq: Two times a day (BID) | ORAL | Status: DC
  Administered 2014-08-14 – 2014-08-15 (×3): 2.5 mg via ORAL
  Filled 2014-08-14 (×4): qty 1

## 2014-08-14 MED ORDER — IRBESARTAN 75 MG PO TABS
75.0000 mg | ORAL_TABLET | Freq: Every day | ORAL | Status: DC
  Administered 2014-08-14 – 2014-08-15 (×2): 75 mg via ORAL
  Filled 2014-08-14 (×3): qty 1

## 2014-08-14 MED ORDER — FLUTICASONE PROPIONATE 50 MCG/ACT NA SUSP
1.0000 | Freq: Every day | NASAL | Status: DC
  Administered 2014-08-14: 1 via NASAL
  Filled 2014-08-14: qty 16

## 2014-08-14 MED ORDER — HYDROCHLOROTHIAZIDE 25 MG PO TABS
25.0000 mg | ORAL_TABLET | Freq: Every day | ORAL | Status: DC
  Administered 2014-08-14 – 2014-08-15 (×2): 25 mg via ORAL
  Filled 2014-08-14 (×3): qty 1

## 2014-08-14 MED ORDER — FUROSEMIDE 10 MG/ML IJ SOLN
40.0000 mg | Freq: Every day | INTRAMUSCULAR | Status: DC
  Administered 2014-08-15: 40 mg via INTRAVENOUS
  Filled 2014-08-14: qty 4

## 2014-08-14 MED ORDER — ONDANSETRON HCL 4 MG PO TABS: 4.0000 mg | ORAL_TABLET | Freq: Four times a day (QID) | ORAL | Status: DC | PRN

## 2014-08-14 NOTE — ED Notes (Signed)
Daughter Vernie Murders [phone]

## 2014-08-14 NOTE — H&P (Signed)
[facility] History and Physical  Alexandra Luna MGQ:[id number] DOB: Sep 08, 1926 DOA: 08/14/2014  Referring physician: ER physician: Dr. Veatrice Kells PCP: Lilian Coma, MD  Chief Complaint: shortness of breath  HPI:  81 year old female with past medical history of chronic diastolic CHF (2  DE CHO in 09/2012 showed preserved EF), hypertension, dyslipidemia, atrial fibrillation on anticoagulation with apixaban who presented from [facility] for worsening shortness of breath for last 1 day prior to this admission. Patient reports shortness of breath present at rest and worse with exertion. No associated cough, leg swelling, chest pain. No palpitations. No falls. No lightheadedness. No abdominal pain, nausea or vomiting. No fevers. No blood ins tool or urine.   In ED, pt was hemodynamically stable. Her oxygen saturation dropped to mid 80's after ambulation. CXR showed cardiac enlargement but no pulmonary congestion. CT angio chest showed no pulmonary embolism but findings were consistent with CHF and interstitial thickening with moderate right and small left pleural effusions. Also seen were nodular densities in the lung base largest measuring 5 mm and requires follow up in 6-12 months if pt high risk for bronchogenic carcinoma. If low risk then 1 year follow up recommended.   In ED, pt was given lasix IV for possible acute diastolic CHF. She was referred to [facility] for further evaluation and management.    Assessment & Plan    Principal Problem: Acute respiratory failure with hypoxia / Acute diastolic CHF - Last 2 D ECHO in 09/2012 with preserved EF - BNP on this admission 373 - Given lasix 40 mg IV in ED. Since BP stable we will continue lasix 40 mg IV daily. - Resume home meds: atenolol, hctz, avapro, verapamil - Daily weight and strict intake and output - Replete electrolytes as needed  Active Problems: Moderate right and small left pleural effusion / Basilar nodular  densities - We can hold off on thoracentesis at this time unless respiratory status worsens - Needs CT scan follow up 6-12 months depending on her risk for bronchogenic carcinoma   Essential hypertension - Resume atenolol, hctz, avapro, verapamil   Atrial fibrillation - CHADS vasc score 4 - On Ac with apixaban - Rate controlled with atenolol   Dyslipidemia - Continue Crestor     DVT prophylaxis:  - SCD's bilaterally; pt also on apixaban BID     Radiological Exams on Admission: Dg Chest 2 View 08/14/2014   Cardiac enlargement. Small bilateral pleural effusions. No pulmonary vascular congestion or edema.   Electronically Signed   By: Lucienne Capers M.D.   On: 08/14/2014 06:10   Ct Angio Chest Pe W/cm &/or Wo Cm 08/14/2014 1. No evidence of a pulmonary embolism. 2. Findings consistent with congestive heart failure with cardiomegaly, interstitial thickening and moderate right and small left pleural effusions. There is also a small pericardial effusion. No convincing pneumonia. 3. Mild mediastinal adenopathy, likely reactive. 4. Small nodular densities in the lung bases. Largest measures 5 mm. If the patient is at high risk for bronchogenic carcinoma, follow-up chest CT at 6-12 months is recommended. If the patient is at low risk for bronchogenic carcinoma, follow-up chest CT at 12 months is recommended.   EKG: I have personally reviewed EKG. EKG shows atrial fibrillation anda anterior infarct old and unchanged compared with prior studies   Code Status: DNR/DNI Family Communication: Plan of care discussed with the patient  Disposition Plan: Admit for further evaluation, telemetry   Leisa Lenz, MD  [facility] Pager [phone]  Time  spent in minutes: 75 minutes  Review of Systems:  Constitutional: Negative for fever, chills and malaise/fatigue. Negative for diaphoresis.  HENT: Negative for hearing loss, ear pain, nosebleeds, congestion, sore throat, neck pain, tinnitus and  ear discharge.   Eyes: Negative for blurred vision, double vision, photophobia, pain, discharge and redness.  Respiratory: per HPI.   Cardiovascular: Negative for chest pain, palpitations, orthopnea, claudication and leg swelling.  Gastrointestinal: Negative for nausea, vomiting and abdominal pain. Negative for heartburn, constipation, blood in stool and melena.  Genitourinary: Negative for dysuria, urgency, frequency, hematuria and flank pain.  Musculoskeletal: Negative for myalgias, back pain, joint pain and falls.  Skin: Negative for itching and rash.  Neurological: Negative for dizziness and weakness. Negative for tingling, tremors, sensory change, speech change, focal weakness, loss of consciousness and headaches.  Endo/Heme/Allergies: Negative for environmental allergies and polydipsia. Does not bruise/bleed easily.  Psychiatric/Behavioral: Negative for suicidal ideas. The patient is not nervous/anxious.      Past Medical History  Diagnosis Date  . Renal hypertension 03/07/2005    no evidence of significant diameter reduction, dissection, tortuosity, FMD or orther vascular abnormality; kidneys equal in size, symmetry, with normal cortex and medulla  . Palpitations   . Mixed hyperlipidemia   . Anxiety   . Atrial fibrillation     On chronic Eliquis  . Breast cancer dx'd 1997    left; lt lumpectomy and XRT  . Pre-diabetes    Past Surgical History  Procedure Laterality Date  . Breast lumpectomy    . Tonsillectomy    . Cesarean section      x 3  . Doppler echocardiography  08/29/2011    EF >75%;  LV systolic fcn normal; doppler flow pattern suggestive of impaired LV relaxation; LA mildly dilated; aortic valve mildly sclerotic, no aortic stenosis  . Cardiovascular stress test  526/2011    R/Dipyridamole - EF 71%; normal myocaridal perfusion w/o evidence of ischemia or infarct; no schintigraphic evidence of inducible myocaridal ischemia; global LV function normal; EKG negative for  ischemia; low risk scan   . Cardioversion N/A 11/12/2012    Procedure: CARDIOVERSION;  Surgeon: Troy Sine, MD;  Location: [facility];  Service: Cardiovascular;  Laterality: N/A;  . Breast lumpectomy  1998    left side   Social History:  reports that she quit smoking about 21 years ago. Her smoking use included Cigarettes. She has never used smokeless tobacco. She reports that she drinks alcohol. She reports that she does not use illicit drugs.  Allergies  Allergen Reactions  . Influenza Vaccines Other (See Comments)    Pt's arm swelled and dr told her not to take vaccine again.  Preston Fleeting [Trandolapril-Verapamil Hcl Er] Other (See Comments)    Increase blood pressure  . Adhesive [Tape] Rash    Adhesive bandaids   . Azithromycin Rash  . Lipitor [Atorvastatin] Rash  . Penicillins Rash    Family History:  Family History  Problem Relation Age of Onset  . Heart failure Mother   . Breast cancer Mother   . Hypertension Father   . Kidney cancer Father   . Stroke Maternal Grandmother      Prior to Admission medications   Medication Sig Start Date End Date Taking? Authorizing Provider  acetaminophen (TYLENOL) 500 MG tablet Take 1,000 mg by mouth at bedtime.    Yes Historical Provider, MD  apixaban (ELIQUIS) 2.5 MG TABS tablet Take 1 tablet (2.5 mg total) by mouth 2 (two) times daily. 12/23/13  Yes Troy Sine, MD  atenolol (TENORMIN) 25 MG tablet Take 1 tablet (25 mg total) by mouth at bedtime. 07/01/14  Yes Robbie Lis, MD  calcium carbonate (OS-CAL) 600 MG TABS Take 600 mg by mouth 2 (two) times daily with a meal.     Yes Historical Provider, MD  cetirizine (ZYRTEC) 10 MG tablet Take 10 mg by mouth daily.   Yes Historical Provider, MD  hydrochlorothiazide (HYDRODIURIL) 25 MG tablet Take 1 tablet (25 mg total) by mouth daily. 03/15/14  Yes Troy Sine, MD  rosuvastatin (CRESTOR) 10 MG tablet Take 1 tablet (10 mg total) by mouth every other day. Patient taking differently:  Take 10 mg by mouth daily.  05/10/14  Yes Troy Sine, MD  valsartan (DIOVAN) 40 MG tablet Take 1 tablet (40 mg total) by mouth daily. 02/15/14  Yes Troy Sine, MD  verapamil (VERELAN PM) 240 MG 24 hr capsule Take 1 capsule (240 mg total) by mouth at bedtime. 05/03/14  Yes Troy Sine, MD  amoxicillin (AMOXIL) 500 MG tablet Take 1 tablet (500 mg total) by mouth 2 (two) times daily. Patient not taking: Reported on 08/14/2014 07/01/14   Robbie Lis, MD  feeding supplement, ENSURE ENLIVE, (ENSURE ENLIVE) LIQD Take 237 mLs by mouth 2 (two) times daily between meals. Patient not taking: Reported on 08/14/2014 07/01/14   Robbie Lis, MD  fluticasone [facility]) 50 MCG/ACT nasal spray Place 1 spray into both nostrils at bedtime.  07/04/12   Historical Provider, MD  Meclizine HCl (BONINE PO) Take 0.5-1 tablets by mouth daily as needed (dizziness).     Historical Provider, MD  sulfamethoxazole-trimethoprim (BACTRIM DS,SEPTRA DS) 800-160 MG per tablet Take 1 tablet by mouth 2 (two) times daily. Patient not taking: Reported on 08/14/2014 07/01/14   Robbie Lis, MD   Physical Exam: Filed Vitals:   08/14/14 0439 08/14/14 0730  SpO2: 95% 97%    Physical Exam  Constitutional: Appears well-developed and well-nourished. No distress.  HENT: Normocephalic. No tonsillar erythema or exudates Eyes: Conjunctivae and EOM are normal, no scleral icterus.  Neck: Normal ROM. Neck supple. No JVD. No tracheal deviation. No thyromegaly.  CVS: irregular rhythm, rate controlled, S1/S2 (+) Pulmonary: Effort and breath sounds normal, no stridor, rhonchi, wheezes, rales.  Abdominal: Soft. BS +,  no distension, tenderness, rebound or guarding.  Musculoskeletal: Normal range of motion. No edema and no tenderness.  Lymphadenopathy: No lymphadenopathy noted, cervical, inguinal. Neuro: Alert. Normal reflexes, muscle tone coordination. No focal neurologic deficits. Skin: Skin is warm and dry. No rash noted.  No erythema. No  pallor.  Psychiatric: Normal mood and affect. Behavior, judgment, thought content normal.   Labs on Admission:  Basic Metabolic Panel:  Recent Labs Lab 08/14/14 0605  NA 138  K 3.5  CL 103  GLUCOSE 121*  BUN 16  CREATININE 0.50   Liver Function Tests: No results for input(s): AST, ALT, ALKPHOS, BILITOT, PROT, ALBUMIN in the last 168 hours. No results for input(s): LIPASE, AMYLASE in the last 168 hours. No results for input(s): AMMONIA in the last 168 hours. CBC:  Recent Labs Lab 08/14/14 0538 08/14/14 0605  WBC 8.6  --   NEUTROABS 6.8  --   HGB 11.7* 12.9  HCT 36.1 38.0  MCV 96.0  --   PLT 156  --    Cardiac Enzymes: No results for input(s): CKTOTAL, CKMB, CKMBINDEX, TROPONINI in the last 168 hours. BNP: Invalid input(s): POCBNP CBG: No results for  input(s): GLUCAP in the last 168 hours.  If 7PM-7AM, please contact night-coverage [email or web address] Password [facility] 08/14/2014, 7:46 AM

## 2014-08-14 NOTE — ED Notes (Signed)
Ems called out to [facility] for pt being sob,  That has resolved on it's on,  Pt says she don't trust drs at facility and wants to know if she is receiving the correct medication.  Pt is alert and oriented per norm, history of anxiety

## 2014-08-14 NOTE — ED Notes (Signed)
Pt ambulated to BR without distress noted and sats with ambulation was 93-94%RA. Upon returning to room pt became [facility] and sats at 84-85%RA. After resting for approximately 39min and pt encourage to use purse breathing sats increased to 93%RA. Applied O2 at 2lpm with sats increased to 100%RA.

## 2014-08-14 NOTE — ED Notes (Signed)
Admitting MD at bedside.

## 2014-08-14 NOTE — ED Notes (Signed)
Spoke with Mable Fill, Agricultural consultant

## 2014-08-14 NOTE — Progress Notes (Signed)
Called to see Alexandra Luna for goals of care. Incomplete information on the chart. Will return tomorrow to complete consult when there is more information available. Thanks for calling - will f/u 5/15

## 2014-08-14 NOTE — ED Notes (Signed)
Patient transported to CT 

## 2014-08-14 NOTE — ED Provider Notes (Signed)
CSN: [id number]     Arrival date & time 08/14/14  0439 History   First MD Initiated Contact with Patient 08/14/14 0500     Chief Complaint  Patient presents with  . Shortness of Breath     (Consider location/radiation/quality/duration/timing/severity/associated sxs/prior Treatment) Patient is a 81 y.o. female presenting with shortness of breath. The history is provided by the patient and medical records.  Shortness of Breath Severity:  Moderate Onset quality:  Gradual Timing:  Constant Progression:  Worsening Chronicity:  Chronic Context: not emotional upset and not pollens   Relieved by:  Nothing Worsened by:  Nothing tried Ineffective treatments:  None tried Associated symptoms: no abdominal pain, no chest pain, no cough, no fever, no hemoptysis, no neck pain, no PND, no sore throat and no wheezing   Risk factors: no recent surgery   No fevers.  States she has been put on multiple meds by the doctor at assisted living without effect.    Past Medical History  Diagnosis Date  . Renal hypertension 03/07/2005    no evidence of significant diameter reduction, dissection, tortuosity, FMD or orther vascular abnormality; kidneys equal in size, symmetry, with normal cortex and medulla  . Palpitations   . Mixed hyperlipidemia   . Anxiety   . Atrial fibrillation     On chronic Eliquis  . Breast cancer dx'd 1997    left; lt lumpectomy and XRT  . Pre-diabetes    Past Surgical History  Procedure Laterality Date  . Breast lumpectomy    . Tonsillectomy    . Cesarean section      x 3  . Doppler echocardiography  08/29/2011    EF >07%;  LV systolic fcn normal; doppler flow pattern suggestive of impaired LV relaxation; LA mildly dilated; aortic valve mildly sclerotic, no aortic stenosis  . Cardiovascular stress test  526/2011    R/Dipyridamole - EF 71%; normal myocaridal perfusion w/o evidence of ischemia or infarct; no schintigraphic evidence of inducible myocaridal ischemia; global LV  function normal; EKG negative for ischemia; low risk scan   . Cardioversion N/A 11/12/2012    Procedure: CARDIOVERSION;  Surgeon: Troy Sine, MD;  Location: [facility] ENDOSCOPY;  Service: Cardiovascular;  Laterality: N/A;  . Breast lumpectomy  1998    left side   Family History  Problem Relation Age of Onset  . Heart failure Mother   . Breast cancer Mother   . Hypertension Father   . Kidney cancer Father   . Stroke Maternal Grandmother    History  Substance Use Topics  . Smoking status: Former Smoker    Types: Cigarettes    Quit date: 04/02/1993  . Smokeless tobacco: Never Used     Comment: patient quit smoking around 1994  . Alcohol Use: Yes     Comment: 1/4 glass of wine every 2 weeks   OB History    No data available     Review of Systems  Constitutional: Negative for fever.  HENT: Negative for sore throat.   Respiratory: Positive for shortness of breath. Negative for cough, hemoptysis and wheezing.   Cardiovascular: Negative for chest pain, palpitations, leg swelling and PND.  Gastrointestinal: Negative for abdominal pain.  Musculoskeletal: Negative for neck pain.  All other systems reviewed and are negative.     Allergies  Influenza vaccines; [place]; Adhesive; Azithromycin; Lipitor; and Penicillins  Home Medications   Prior to Admission medications   Medication Sig Start Date End Date Taking? Authorizing Provider  acetaminophen (TYLENOL) 500 MG  tablet Take 1,000 mg by mouth at bedtime.    Yes Historical Provider, MD  apixaban (ELIQUIS) 2.5 MG TABS tablet Take 1 tablet (2.5 mg total) by mouth 2 (two) times daily. 12/23/13  Yes Troy Sine, MD  atenolol (TENORMIN) 25 MG tablet Take 1 tablet (25 mg total) by mouth at bedtime. 07/01/14  Yes Robbie Lis, MD  calcium carbonate (OS-CAL) 600 MG TABS Take 600 mg by mouth 2 (two) times daily with a meal.     Yes Historical Provider, MD  cetirizine (ZYRTEC) 10 MG tablet Take 10 mg by mouth daily.   Yes Historical Provider,  MD  hydrochlorothiazide (HYDRODIURIL) 25 MG tablet Take 1 tablet (25 mg total) by mouth daily. 03/15/14  Yes Troy Sine, MD  rosuvastatin (CRESTOR) 10 MG tablet Take 1 tablet (10 mg total) by mouth every other day. Patient taking differently: Take 10 mg by mouth daily.  05/10/14  Yes Troy Sine, MD  valsartan (DIOVAN) 40 MG tablet Take 1 tablet (40 mg total) by mouth daily. 02/15/14  Yes Troy Sine, MD  verapamil (VERELAN PM) 240 MG 24 hr capsule Take 1 capsule (240 mg total) by mouth at bedtime. 05/03/14  Yes Troy Sine, MD  amoxicillin (AMOXIL) 500 MG tablet Take 1 tablet (500 mg total) by mouth 2 (two) times daily. Patient not taking: Reported on 08/14/2014 07/01/14   Robbie Lis, MD  feeding supplement, ENSURE ENLIVE, (ENSURE ENLIVE) LIQD Take 237 mLs by mouth 2 (two) times daily between meals. Patient not taking: Reported on 08/14/2014 07/01/14   Robbie Lis, MD  fluticasone [facility]) 50 MCG/ACT nasal spray Place 1 spray into both nostrils at bedtime.  07/04/12   Historical Provider, MD  Meclizine HCl (BONINE PO) Take 0.5-1 tablets by mouth daily as needed (dizziness).     Historical Provider, MD  sulfamethoxazole-trimethoprim (BACTRIM DS,SEPTRA DS) 800-160 MG per tablet Take 1 tablet by mouth 2 (two) times daily. Patient not taking: Reported on 08/14/2014 07/01/14   Robbie Lis, MD   SpO2 95% Physical Exam  Constitutional: She is oriented to person, place, and time. She appears well-developed and well-nourished.  HENT:  Head: Normocephalic and atraumatic.  Mouth/Throat: Oropharynx is clear and moist.  Eyes: Conjunctivae are normal. Pupils are equal, round, and reactive to light.  Neck: Normal range of motion. Neck supple.  Cardiovascular: Normal rate, regular rhythm and intact distal pulses.   Pulmonary/Chest: No stridor. She has decreased breath sounds. She has no rales.  Abdominal: Soft. Bowel sounds are normal. There is no tenderness. There is no rebound and no guarding.   Musculoskeletal: Normal range of motion. She exhibits no edema.  Neurological: She is alert and oriented to person, place, and time.  Skin: Skin is warm and dry. She is not diaphoretic.  Psychiatric: She has a normal mood and affect.    ED Course  Procedures (including critical care time) Labs Review Labs Reviewed  CBC WITH DIFFERENTIAL/PLATELET  I-STAT CHEM 8, ED  I-STAT TROPOININ, ED    Imaging Review No results found.   EKG Interpretation   Date/Time:  Saturday Aug 14 2014 05:50:46 EDT Ventricular Rate:  85 PR Interval:    QRS Duration: 100 QT Interval:  389 QTC Calculation: 463 R Axis:   77 Text Interpretation:  Atrial fibrillation Anterior infarct, old unchanged  Confirmed by [facility]  MD, Aariona Momon ([id number]) on 08/14/2014 5:53:50 AM      MDM   Final diagnoses:  None  Results for orders placed or performed during the hospital encounter of 08/14/14  CBC with Differential/Platelet  Result Value Ref Range   WBC 8.6 4.0 - 10.5 K/uL   RBC 3.76 (L) 3.87 - 5.11 MIL/uL   Hemoglobin 11.7 (L) 12.0 - 15.0 g/dL   HCT 36.1 36.0 - 46.0 %   MCV 96.0 78.0 - 100.0 fL   MCH 31.1 26.0 - 34.0 pg   MCHC 32.4 30.0 - 36.0 g/dL   RDW 16.1 (H) 11.5 - 15.5 %   Platelets 156 150 - 400 K/uL   Neutrophils Relative % 79 (H) 43 - 77 %   Neutro Abs 6.8 1.7 - 7.7 K/uL   Lymphocytes Relative 10 (L) 12 - 46 %   Lymphs Abs 0.9 0.7 - 4.0 K/uL   Monocytes Relative 10 3 - 12 %   Monocytes Absolute 0.8 0.1 - 1.0 K/uL   Eosinophils Relative 1 0 - 5 %   Eosinophils Absolute 0.1 0.0 - 0.7 K/uL   Basophils Relative 0 0 - 1 %   Basophils Absolute 0.0 0.0 - 0.1 K/uL  I-Stat Chem 8, ED  Result Value Ref Range   Sodium 138 135 - 145 mmol/L   Potassium 3.5 3.5 - 5.1 mmol/L   Chloride 103 101 - 111 mmol/L   BUN 16 6 - 20 mg/dL   Creatinine, Ser 0.50 0.44 - 1.00 mg/dL   Glucose, Bld 121 (H) 65 - 99 mg/dL   Calcium, Ion 1.25 1.13 - 1.30 mmol/L   TCO2 22 0 - 100 mmol/L   Hemoglobin 12.9 12.0 -  15.0 g/dL   HCT 38.0 36.0 - 46.0 %  I-stat troponin, ED  Result Value Ref Range   Troponin i, poc 0.01 0.00 - 0.08 ng/mL   Comment 3           Dg Chest 2 View  08/14/2014   CLINICAL DATA:  Shortness of breath. History of left breast cancer. Former smoker.  EXAM: CHEST  2 VIEW  COMPARISON:  06/28/2014  FINDINGS: Cardiac enlargement without vascular congestion or edema. No focal airspace disease or consolidation in the lungs. Calcification of the aorta. Surgical clips in the left axilla. No pneumothorax. Small bilateral pleural effusions.  IMPRESSION: Cardiac enlargement. Small bilateral pleural effusions. No pulmonary vascular congestion or edema.   Electronically Signed   By: Lucienne Capers M.D.   On: 08/14/2014 06:10   Ct Angio Chest Pe W/cm &/or Wo Cm  08/14/2014   CLINICAL DATA:  Reports episode of SOB earlier and history of anxiety.Nurse note: Ems called out to [facility] for pt being sob, That has resolved on it's on, Pt says she don't trust drs at facility and wants to know if she is receiving the correct medication. Pt is alert and oriented per norm, history of anxiety  EXAM: CT ANGIOGRAPHY CHEST WITH CONTRAST  TECHNIQUE: Multidetector CT imaging of the chest was performed using the standard protocol during bolus administration of intravenous contrast. Multiplanar CT image reconstructions and MIPs were obtained to evaluate the vascular anatomy.  CONTRAST:  159mL OMNIPAQUE IOHEXOL 350 MG/ML SOLN  COMPARISON:  Current chest radiograph.  FINDINGS: Angiographic study: No evidence of a pulmonary embolus. Great vessels normal in caliber. Atherosclerotic calcifications seen across the thoracic aorta.  Thoracic inlet:  No neck base or axillary masses or adenopathy  Mediastinum and hila: Mild cardiomegaly. Moderate coronary artery calcifications. Small pericardial effusion. Mild mediastinal adenopathy. Reference noted in the AP window measures 13 mm in short  axis. Right para carinal node adjacent to  the azygos arch measures 14 mm in short axis. No mediastinal masses. No hilar masses or discrete enlarged lymph nodes.  Lungs and pleural: Moderate right and small left pleural effusions. There is bilateral diffuse interstitial thickening. More focal linear and discoid type opacity is noted in the anteromedial upper lobes and right middle lobe, likely atelectasis. There are nodular opacities in the right middle lobe. Largest measures 5 mm. Additional reticular opacities are noted in the lung bases likely subsegmental atelectasis. No pneumothorax.  Limited upper abdomen: Liver shows a nodular contour suggesting cirrhosis.  There is mild diffuse subcutaneous edema.  Musculoskeletal: Mild degenerative changes of the thoracic spine. No osteoblastic or osteolytic lesions.  Review of the MIP images confirms the above findings.  IMPRESSION: 1. No evidence of a pulmonary embolism. 2. Findings consistent with congestive heart failure with cardiomegaly, interstitial thickening and moderate right and small left pleural effusions. There is also a small pericardial effusion. No convincing pneumonia. 3. Mild mediastinal adenopathy, likely reactive. 4. Small nodular densities in the lung bases. Largest measures 5 mm. If the patient is at high risk for bronchogenic carcinoma, follow-up chest CT at 6-12 months is recommended. If the patient is at low risk for bronchogenic carcinoma, follow-up chest CT at 12 months is recommended. This recommendation follows the consensus statement: Guidelines for Management of Small Pulmonary Nodules Detected on CT Scans: A Statement from the [employer] as published in Radiology 2005;237:395-400.   Electronically Signed   By: Lajean Manes M.D.   On: 08/14/2014 07:23     Medications  furosemide (LASIX) injection 40 mg (not administered)  iohexol (OMNIPAQUE) 350 MG/ML injection 100 mL (100 mLs Intravenous Contrast Given 08/14/14 0657)       Samyak Sackmann, MD 08/14/14 [phone]

## 2014-08-14 NOTE — ED Notes (Signed)
Pt voided x2 uncounted since given Lasix.

## 2014-08-14 NOTE — ED Notes (Signed)
Bed: WA07 Expected date:  Expected time:  Means of arrival:  Comments: EMS 

## 2014-08-15 DIAGNOSIS — I4891 Unspecified atrial fibrillation: Secondary | ICD-10-CM

## 2014-08-15 DIAGNOSIS — Z7901 Long term (current) use of anticoagulants: Secondary | ICD-10-CM

## 2014-08-15 LAB — COMPREHENSIVE METABOLIC PANEL
ALBUMIN: 3.6 g/dL (ref 3.5–5.0)
ALT: 16 U/L (ref 14–54)
ANION GAP: 10 (ref 5–15)
AST: 24 U/L (ref 15–41)
Alkaline Phosphatase: 65 U/L (ref 38–126)
BUN: 18 mg/dL (ref 6–20)
CO2: 28 mmol/L (ref 22–32)
CREATININE: 0.58 mg/dL (ref 0.44–1.00)
Calcium: 9.7 mg/dL (ref 8.9–10.3)
Chloride: 100 mmol/L — ABNORMAL LOW (ref 101–111)
GFR calc Af Amer: >60 mL/min (ref >60)
GFR calc non Af Amer: >60 mL/min (ref >60)
Glucose, Bld: 111 mg/dL — ABNORMAL HIGH (ref 65–99)
Potassium: 3.3 mmol/L — ABNORMAL LOW (ref 3.5–5.1)
SODIUM: 138 mmol/L (ref 135–145)
TOTAL PROTEIN: 6.5 g/dL (ref 6.5–8.1)
Total Bilirubin: 1 mg/dL (ref 0.3–1.2)

## 2014-08-15 LAB — CBC
HEMATOCRIT: 35.4 % — AB (ref 36.0–46.0)
HEMOGLOBIN: 11.3 g/dL — AB (ref 12.0–15.0)
MCH: 30.5 pg (ref 26.0–34.0)
MCHC: 31.9 g/dL (ref 30.0–36.0)
MCV: 95.4 fL (ref 78.0–100.0)
Platelets: 163 10*3/uL (ref 150–400)
RBC: 3.71 MIL/uL — ABNORMAL LOW (ref 3.87–5.11)
RDW: 16.1 % — ABNORMAL HIGH (ref 11.5–15.5)
WBC: 5.4 10*3/uL (ref 4.0–10.5)

## 2014-08-15 LAB — GLUCOSE, CAPILLARY: GLUCOSE-CAPILLARY: 109 mg/dL — AB (ref 65–99)

## 2014-08-15 MED ORDER — POTASSIUM CHLORIDE 20 MEQ PO PACK
20.0000 meq | PACK | Freq: Every day | ORAL | Status: DC
Start: 2014-08-15 — End: 2014-08-15

## 2014-08-15 MED ORDER — FUROSEMIDE 20 MG PO TABS: 20.0000 mg | ORAL_TABLET | Freq: Every day | ORAL | Status: AC

## 2014-08-15 MED ORDER — ONDANSETRON HCL 4 MG PO TABS: 4.0000 mg | ORAL_TABLET | Freq: Four times a day (QID) | ORAL | Status: DC | PRN

## 2014-08-15 MED ORDER — POTASSIUM CHLORIDE CRYS ER 20 MEQ PO TBCR
40.0000 meq | EXTENDED_RELEASE_TABLET | Freq: Once | ORAL | Status: AC
  Administered 2014-08-15: 40 meq via ORAL
  Filled 2014-08-15: qty 2

## 2014-08-15 NOTE — Clinical Social Work Note (Signed)
CSW received a call from Md stating that pt was ready for discharge  CSW called and spoke with Lannette Donath at [facility] to facilitate transfer  CSW called and spoke with pt's daughter, Manuela Schwartz to arrange for pt transport to facility  CSW faxed paperwork to facility, prepared discharge packet and provided it to RN  No further CSW needs  .Dede Query, LCSW [facility] Clinical Social Worker - Weekend Coverage cell #: [phone]

## 2014-08-15 NOTE — Clinical Social Work Note (Signed)
CSW received a call from pt's ALF stating that pt's FL2 had oxygen on it and needed clarity since pt was not sent home with any oxygen  CSW spoke with pt's RN about oxygen and she stated that pt was at room saturation and did not need oxygen  CSW paged MD and provided information that pt's FL2 had oxygen but there was not an order for oxygen inquiring if  pt did need any oxygen.  Md stated that pt did not need any oxygen  CSW called back [place]/Priscilla to let her know about pt's oxygen   .Dede Query, LCSW [facility] Clinical Social Worker - Weekend Coverage cell #: [phone]

## 2014-08-15 NOTE — Discharge Summary (Signed)
Physician Discharge Summary  Alexandra Luna YDX:[id number] DOB: 1926/12/06 DOA: 08/14/2014  PCP: Lilian Coma, MD  Admit date: 08/14/2014 Discharge date: 08/15/2014  Recommendations for Outpatient Follow-up:  1. Pt will start taking lasix 20 mg daily. She is also on Diovan, verapamil and atenolol.  2. Repeat CT chest recommended in 6-12 months for evaluation of nodular densities in the lung base.  Discharge Diagnoses:  Principal Problem:   Acute respiratory failure with hypoxia Active Problems:   Acute diastolic congestive heart failure   AF (atrial fibrillation)   Essential hypertension   Hyperlipidemia, mixed   Chronic anticoagulation    Discharge Condition: stable   Diet recommendation: as tolerated   History of present illness:  81 year old female with past medical history of chronic diastolic CHF (2 DE CHO in 09/2012 showed preserved EF), hypertension, dyslipidemia, atrial fibrillation on anticoagulation with apixaban who presented from [facility] for worsening shortness of breath for past 24 hours PTA. No reports of chest pain, leg swelling. No palpitations.  On admission, patient was slightly hypoxic with oxygen saturation in the mid 80s but it has improved with nasal cannula. CXR showed cardiac enlargement but no pulmonary congestion. CT angio chest showed no pulmonary embolism but findings were consistent with CHF and interstitial thickening with moderate right and small left pleural effusions. Also seen were nodular densities in the lung base largest measuring 5 mm and requires follow up in 6-12 months if pt high risk for bronchogenic carcinoma. If low risk then 1 year follow up recommended.   Patient was given Lasix in ED. We have continued Lasix on the admission. Patient feels great this morning and was to return to skilled nursing facility today.    Assessment & Plan    Principal Problem: Acute respiratory failure with hypoxia / Acute diastolic CHF -  Last 2 D ECHO in 09/2012 with preserved EF - BNP on this admissionwas mildly elevated at 373. - Patient was given Lasix 40 mg IV in ED. She feels great this morning. We will continue Lasix 20 mg daily on discharge. - Since pt on Diovan and this can cause hyperkalemia will hold of on prescribing potassium. Potassium repleted prior to discharge.  - Because patient will be on Lasix there is no need to take HCTZ on discharge.   Active Problems: Moderate right and small left pleural effusion / Basilar nodular densities - Needs CT scan follow up 6-12 months depending on her risk for bronchogenic carcinoma  - Respiratory status stable. No need for thoracentesis at this time.   Essential hypertension - Resume Atenolol, Verapamil, Diovan, Lasix   Atrial fibrillation - CHADS vasc score 4 - Continue AC  with apixaban - Rate controlled with atenolol   Dyslipidemia - Continue Crestor     DVT prophylaxis:  - On full dose AC with apixaban      Radiological Exams on Admission: Dg Chest 2 View 08/14/2014 Cardiac enlargement. Small bilateral pleural effusions. No pulmonary vascular congestion or edema. Electronically Signed By: Lucienne Capers M.D. On: 08/14/2014 06:10   Ct Angio Chest Pe W/cm &/or Wo Cm 08/14/2014 1. No evidence of a pulmonary embolism. 2. Findings consistent with congestive heart failure with cardiomegaly, interstitial thickening and moderate right and small left pleural effusions. There is also a small pericardial effusion. No convincing pneumonia. 3. Mild mediastinal adenopathy, likely reactive. 4. Small nodular densities in the lung bases. Largest measures 5 mm. If the patient is at high risk for bronchogenic carcinoma, follow-up chest CT  at 6-12 months is recommended. If the patient is at low risk for bronchogenic carcinoma, follow-up chest CT at 12 months is recommended.   Code Status: DNR/DNI Family Communication: Plan of care discussed with the patient     Signed:  Leisa Lenz, MD  [facility] 08/15/2014, 8:45 AM  Pager #: [phone]  Time spent in minutes: more than 30 minutes  Procedures:  None   Consultations:  None   Discharge Exam: Filed Vitals:   08/15/14 0443  BP: 143/65  Pulse: 56  Temp: 97.6 F (36.4 C)  Resp: 20   Filed Vitals:   08/14/14 0845 08/14/14 1446 08/14/14 2100 08/15/14 0443  BP: 135/76 119/67 177/70 143/65  Pulse: 73 92 96 56  Temp: 97.6 F (36.4 C) 98.3 F (36.8 C) 98.1 F (36.7 C) 97.6 F (36.4 C)  TempSrc: Oral Oral Oral Oral  Resp: 24 22 21 20   Height: 5\' 6"  (1.676 m)     Weight: 79.107 kg (174 lb 6.4 oz)   78.109 kg (172 lb 3.2 oz)  SpO2: 100% 100% 100% 100%    General: Pt is alert, follows commands appropriately, not in acute distress Cardiovascular: irregular rhythm, rate controlled, S1/S2 + Respiratory: Clear to auscultation bilaterally, no wheezing, no crackles, no rhonchi Abdominal: Soft, non tender, non distended, bowel sounds +, no guarding Extremities: no edema, no cyanosis, pulses palpable bilaterally DP and PT Neuro: Grossly nonfocal  Discharge Instructions  Discharge Instructions    Call MD for:  difficulty breathing, headache or visual disturbances    Complete by:  As directed      Call MD for:  persistant nausea and vomiting    Complete by:  As directed      Call MD for:  severe uncontrolled pain    Complete by:  As directed      Diet - low sodium heart healthy    Complete by:  As directed      Increase activity slowly    Complete by:  As directed             Medication List    STOP taking these medications        amoxicillin 500 MG tablet  Commonly known as:  AMOXIL     hydrochlorothiazide 25 MG tablet  Commonly known as:  HYDRODIURIL     sulfamethoxazole-trimethoprim 800-160 MG per tablet  Commonly known as:  BACTRIM DS,SEPTRA DS      TAKE these medications        acetaminophen 500 MG tablet  Commonly known as:  TYLENOL  Take  1,000 mg by mouth at bedtime.     apixaban 2.5 MG Tabs tablet  Commonly known as:  ELIQUIS  Take 1 tablet (2.5 mg total) by mouth 2 (two) times daily.     atenolol 25 MG tablet  Commonly known as:  TENORMIN  Take 1 tablet (25 mg total) by mouth at bedtime.     BONINE PO  Take 0.5-1 tablets by mouth daily as needed (dizziness).     calcium carbonate 600 MG Tabs tablet  Commonly known as:  OS-CAL  Take 600 mg by mouth 2 (two) times daily with a meal.     cetirizine 10 MG tablet  Commonly known as:  ZYRTEC  Take 10 mg by mouth daily.     feeding supplement (ENSURE ENLIVE) Liqd  Take 237 mLs by mouth 2 (two) times daily between meals.     fluticasone 50 MCG/ACT nasal spray  Commonly  known as:  FLONASE  Place 1 spray into both nostrils at bedtime.     furosemide 20 MG tablet  Commonly known as:  LASIX  Take 1 tablet (20 mg total) by mouth daily.     ondansetron 4 MG tablet  Commonly known as:  ZOFRAN  Take 1 tablet (4 mg total) by mouth every 6 (six) hours as needed for nausea.     rosuvastatin 10 MG tablet  Commonly known as:  CRESTOR  Take 1 tablet (10 mg total) by mouth every other day.     valsartan 40 MG tablet  Commonly known as:  DIOVAN  Take 1 tablet (40 mg total) by mouth daily.     verapamil 240 MG 24 hr capsule  Commonly known as:  VERELAN PM  Take 1 capsule (240 mg total) by mouth at bedtime.            Follow-up Information    Follow up with Lilian Coma, MD. Schedule an appointment as soon as possible for a visit in 1 week.   Specialty:  Family Medicine   Why:  Follow up appt after recent hospitalization   Contact information:   [address] [place] [id number] [phone]        The results of significant diagnostics from this hospitalization (including imaging, microbiology, ancillary and laboratory) are listed below for reference.    Significant Diagnostic Studies: Dg Chest 2 View  08/14/2014   CLINICAL DATA:   Shortness of breath. History of left breast cancer. Former smoker.  EXAM: CHEST  2 VIEW  COMPARISON:  06/28/2014  FINDINGS: Cardiac enlargement without vascular congestion or edema. No focal airspace disease or consolidation in the lungs. Calcification of the aorta. Surgical clips in the left axilla. No pneumothorax. Small bilateral pleural effusions.  IMPRESSION: Cardiac enlargement. Small bilateral pleural effusions. No pulmonary vascular congestion or edema.   Electronically Signed   By: Lucienne Capers M.D.   On: 08/14/2014 06:10   Ct Angio Chest Pe W/cm &/or Wo Cm  08/14/2014   CLINICAL DATA:  Reports episode of SOB earlier and history of anxiety.Nurse note: Ems called out to [facility] for pt being sob, That has resolved on it's on, Pt says she don't trust drs at facility and wants to know if she is receiving the correct medication. Pt is alert and oriented per norm, history of anxiety  EXAM: CT ANGIOGRAPHY CHEST WITH CONTRAST  TECHNIQUE: Multidetector CT imaging of the chest was performed using the standard protocol during bolus administration of intravenous contrast. Multiplanar CT image reconstructions and MIPs were obtained to evaluate the vascular anatomy.  CONTRAST:  171mL OMNIPAQUE IOHEXOL 350 MG/ML SOLN  COMPARISON:  Current chest radiograph.  FINDINGS: Angiographic study: No evidence of a pulmonary embolus. Great vessels normal in caliber. Atherosclerotic calcifications seen across the thoracic aorta.  Thoracic inlet:  No neck base or axillary masses or adenopathy  Mediastinum and hila: Mild cardiomegaly. Moderate coronary artery calcifications. Small pericardial effusion. Mild mediastinal adenopathy. Reference noted in the AP window measures 13 mm in short axis. Right para carinal node adjacent to the azygos arch measures 14 mm in short axis. No mediastinal masses. No hilar masses or discrete enlarged lymph nodes.  Lungs and pleural: Moderate right and small left pleural effusions. There  is bilateral diffuse interstitial thickening. More focal linear and discoid type opacity is noted in the anteromedial upper lobes and right middle lobe, likely atelectasis. There are nodular opacities in the  right middle lobe. Largest measures 5 mm. Additional reticular opacities are noted in the lung bases likely subsegmental atelectasis. No pneumothorax.  Limited upper abdomen: Liver shows a nodular contour suggesting cirrhosis.  There is mild diffuse subcutaneous edema.  Musculoskeletal: Mild degenerative changes of the thoracic spine. No osteoblastic or osteolytic lesions.  Review of the MIP images confirms the above findings.  IMPRESSION: 1. No evidence of a pulmonary embolism. 2. Findings consistent with congestive heart failure with cardiomegaly, interstitial thickening and moderate right and small left pleural effusions. There is also a small pericardial effusion. No convincing pneumonia. 3. Mild mediastinal adenopathy, likely reactive. 4. Small nodular densities in the lung bases. Largest measures 5 mm. If the patient is at high risk for bronchogenic carcinoma, follow-up chest CT at 6-12 months is recommended. If the patient is at low risk for bronchogenic carcinoma, follow-up chest CT at 12 months is recommended. This recommendation follows the consensus statement: Guidelines for Management of Small Pulmonary Nodules Detected on CT Scans: A Statement from the [employer] as published in Radiology 2005;237:395-400.   Electronically Signed   By: Lajean Manes M.D.   On: 08/14/2014 07:23    Microbiology: Recent Results (from the past 240 hour(s))  MRSA PCR Screening     Status: None   Collection Time: 08/14/14  3:00 PM  Result Value Ref Range Status   MRSA by PCR NEGATIVE NEGATIVE Final    Comment:        The GeneXpert MRSA Assay (FDA approved for NASAL specimens only), is one component of a comprehensive MRSA colonization surveillance program. It is not intended to diagnose  MRSA infection nor to guide or monitor treatment for MRSA infections.      Labs: Basic Metabolic Panel:  Recent Labs Lab 08/14/14 0605 08/15/14 0550  NA 138 138  K 3.5 3.3*  CL 103 100*  CO2  --  28  GLUCOSE 121* 111*  BUN 16 18  CREATININE 0.50 0.58  CALCIUM  --  9.7   Liver Function Tests:  Recent Labs Lab 08/15/14 0550  AST 24  ALT 16  ALKPHOS 65  BILITOT 1.0  PROT 6.5  ALBUMIN 3.6   No results for input(s): LIPASE, AMYLASE in the last 168 hours. No results for input(s): AMMONIA in the last 168 hours. CBC:  Recent Labs Lab 08/14/14 0538 08/14/14 0605 08/15/14 0550  WBC 8.6  --  5.4  NEUTROABS 6.8  --   --   HGB 11.7* 12.9 11.3*  HCT 36.1 38.0 35.4*  MCV 96.0  --  95.4  PLT 156  --  163   Cardiac Enzymes: No results for input(s): CKTOTAL, CKMB, CKMBINDEX, TROPONINI in the last 168 hours. BNP: BNP (last 3 results)  Recent Labs  08/14/14 0600  BNP 373.4*    ProBNP (last 3 results) No results for input(s): PROBNP in the last 8760 hours.  CBG:  Recent Labs Lab 08/15/14 0729  GLUCAP 109*

## 2014-08-15 NOTE — Evaluation (Signed)
Physical Therapy Evaluation Patient Details Name: Alexandra Luna MRN: [id number] DOB: 10/27/1926 Today's Date: 08/15/2014   History of Present Illness  Pt admitted from SNF with increasing SOB - dx acute resp failure and CHF.  Hx of mild dementia  Clinical Impression  Pt admitted as above and presenting at min guard assist level for mobility 2* mild general instability and questionable safety awareness related to dementia.  Pt plans return to prior living arrangement at [facility]    Follow Up Recommendations SNF    Equipment Recommendations  None recommended by PT    Recommendations for Other Services       Precautions / Restrictions Precautions Precautions: Fall Precaution Comments: Mildly impulsive with questionable saftey awareness Restrictions Weight Bearing Restrictions: No      Mobility  Bed Mobility Overal bed mobility: Modified Independent Bed Mobility: Supine to Sit;Sit to Supine              Transfers Overall transfer level: Needs assistance Equipment used: Rolling walker (2 wheeled) Transfers: Sit to/from Stand Sit to Stand: Min guard         General transfer comment: cues for use of UEs; min guard to steady on initial standing  Ambulation/Gait Ambulation/Gait assistance: Min guard Ambulation Distance (Feet): 350 Feet (and 20' to bathroom) Assistive device: Rolling walker (2 wheeled) Gait Pattern/deviations: Step-through pattern;Decreased step length - right;Decreased step length - left;Shuffle;Trunk flexed Gait velocity: Decreased   General Gait Details: cues for pacing, posture and position from RW.  Several standing rests required to complete task 2* mild SOB.  Attempted to monitor sats but unreliable results 2* difficulty reading.  ?cold hands  Stairs            Wheelchair Mobility    Modified Rankin (Stroke Patients Only)       Balance                                             Pertinent Vitals/Pain  Pain Assessment: No/denies pain    Home Living Family/patient expects to be discharged to:: Assisted living                      Prior Function Level of Independence: Needs assistance         Comments: Pt reports she uses RW and does not get up on her own at [place] Hand: Right    Extremity/Trunk Assessment   Upper Extremity Assessment: Overall WFL for tasks assessed           Lower Extremity Assessment: Overall WFL for tasks assessed      Cervical / Trunk Assessment: Normal  Communication   Communication: No difficulties  Cognition Arousal/Alertness: Awake/alert Behavior During Therapy: WFL for tasks assessed/performed Overall Cognitive Status: History of cognitive impairments - at baseline                      General Comments      Exercises        Assessment/Plan    PT Assessment All further PT needs can be met in the next venue of care  PT Diagnosis Difficulty walking   PT Problem List Decreased activity tolerance;Decreased balance;Decreased mobility;Decreased cognition;Decreased knowledge of use of DME;Decreased safety awareness  PT Treatment Interventions DME instruction;Gait training;Functional mobility training;Therapeutic activities;Patient/family  education   PT Goals (Current goals can be found in the Care Plan section) Acute Rehab PT Goals Patient Stated Goal: Walk and not be SOB PT Goal Formulation: All assessment and education complete, DC therapy    Frequency     Barriers to discharge        Co-evaluation               End of Session Equipment Utilized During Treatment: Gait belt Activity Tolerance: Patient tolerated treatment well Patient left: in bed;with call bell/phone within reach Nurse Communication: Mobility status         Time: 5747-3403 PT Time Calculation (min) (ACUTE ONLY): 23 min   Charges:   PT Evaluation $Initial PT Evaluation Tier I: 1  Procedure PT Treatments $Gait Training: 8-22 mins   PT G Codes:        Alexandra Luna 2014-08-20, 12:56 PM

## 2014-08-15 NOTE — Progress Notes (Signed)
Report called & given to Almond Lint, RN at [phone]. Awaiting daughter to pick up patient for transport. Discharge packet being sent with daughter to give to RN.  Rosine Beat, RN

## 2014-08-15 NOTE — Discharge Instructions (Signed)

## 2014-08-25 ENCOUNTER — Telehealth: Payer: Self-pay | Admitting: Internal Medicine

## 2014-08-25 NOTE — Telephone Encounter (Signed)
Consult has been made for 08/26/14 at 11:45pm. Nothing further was needed.

## 2014-08-26 ENCOUNTER — Ambulatory Visit (INDEPENDENT_AMBULATORY_CARE_PROVIDER_SITE_OTHER): Payer: Medicare Other | Admitting: Internal Medicine

## 2014-08-26 ENCOUNTER — Encounter: Payer: Self-pay | Admitting: Internal Medicine

## 2014-08-26 VITALS — BP 124/84 | HR 89 | Ht 66.0 in | Wt 174.0 lb

## 2014-08-26 DIAGNOSIS — R918 Other nonspecific abnormal finding of lung field: Secondary | ICD-10-CM | POA: Diagnosis not present

## 2014-08-26 DIAGNOSIS — I4819 Other persistent atrial fibrillation: Secondary | ICD-10-CM

## 2014-08-26 DIAGNOSIS — I481 Persistent atrial fibrillation: Secondary | ICD-10-CM

## 2014-08-26 DIAGNOSIS — J90 Pleural effusion, not elsewhere classified: Secondary | ICD-10-CM | POA: Diagnosis not present

## 2014-08-26 NOTE — Progress Notes (Signed)
Subjective:    Patient ID: Alexandra Luna, female    DOB: 10-09-1926,    MRN: [id number]  HPI  85 yowf quit smoking 43's newly admitted to assisted living due to falls with doe x around 2012 newly admitted with resting sob :  Admit date: 08/14/2014 Discharge date: 08/15/2014  Discharge Diagnoses:    Acute respiratory failure with hypoxia   Acute diastolic congestive heart failure  AF (atrial fibrillation)  Essential hypertension  Hyperlipidemia, mixed  Chronic anticoagulation  History of present illness:  81 year old female with past medical history of chronic diastolic CHF (2 DE CHO in 09/2012 showed preserved EF), hypertension, dyslipidemia, atrial fibrillation on anticoagulation with apixaban who presented from [facility] for worsening shortness of breath for past 24 hours PTA. No reports of chest pain, leg swelling. No palpitations.  On admission, patient was slightly hypoxic with oxygen saturation in the mid 80s but it has improved with nasal cannula. CXR showed cardiac enlargement but no pulmonary congestion. CT angio chest showed no pulmonary embolism but findings were consistent with CHF and interstitial thickening with moderate right and small left pleural effusions. Also seen were nodular densities in the lung base largest measuring 5 mm and requires follow up in 6-12 months if pt high risk for bronchogenic carcinoma. If low risk then 1 year follow up recommended.   Patient was given Lasix in ED. We have continued Lasix on the admission. Patient felt great this am of d/c and returned to snf  08/15/14       08/26/2014 1st [facility] office visit/ Alexandra Luna   Chief Complaint  Patient presents with  . Pulmonary Consult    Referred by Dr. Fredderick Phenix for eval of pleural effusion. Pt c/o SOB for the past several wks. She is out of breath when she lies down and has to sleep sitting up.  Does okay with exertion as long as she paces herself. She has occ non prod cough, PND.      Was much better at d/c and able to lie flat but over the last week prior to OV  More doe and orthopnea assoc with leg swelling that correlates well with sob and with some longstanding sensation of pnds /dry cough   No obvious day to day or daytime variabilty or assoc  cp or chest tightness, subjective wheeze overt sinus or hb symptoms. No unusual exp hx or h/o childhood pna/ asthma or knowledge of premature birth.  Sleeping ok without nocturnal  or early am exacerbation  of respiratory  c/o's or need for noct saba. Also denies any obvious fluctuation of symptoms with weather or environmental changes or other aggravating or alleviating factors except as outlined above   Current Medications, Allergies, Complete Past Medical History, Past Surgical History, Family History, and Social History were reviewed in [employer] record.      Review of Systems  Constitutional: Negative for fever, chills and unexpected weight change.  HENT: Positive for congestion. Negative for dental problem, ear pain, nosebleeds, postnasal drip, rhinorrhea, sinus pressure, sneezing, sore throat, trouble swallowing and voice change.   Eyes: Negative for visual disturbance.  Respiratory: Positive for cough and shortness of breath. Negative for choking.   Cardiovascular: Negative for chest pain and leg swelling.  Gastrointestinal: Negative for vomiting, abdominal pain and diarrhea.  Genitourinary: Negative for difficulty urinating.  Musculoskeletal: Negative for arthralgias.  Skin: Negative for rash.  Neurological: Negative for tremors, syncope and headaches.  Hematological: Does not bruise/bleed  easily.       Objective:   Physical Exam  Full dentures  Wt Readings from Last 3 Encounters:  08/26/14 174 lb (78.926 kg)  08/15/14 172 lb 3.2 oz (78.109 kg)  06/28/14 172 lb 3.2 oz (78.109 kg)    Vital signs reviewed   HEENT: nl dentition, turbinates, and orophanx. Nl external ear canals  without cough reflex   NECK :  without JVD/Nodes/TM/ nl carotid upstrokes bilaterally   LUNGS: no acc muscle use, clear to A and P bilaterally without cough on insp or exp maneuvers   CV:  IRIR at around80   no s3 or murmur or increase in P2, 1-2+  Bilateral pitting edema despite elastic hose   ABD:  soft and nontender with nl excursion in the supine position. No bruits or organomegaly, bowel sounds nl  MS:  warm without deformities, calf tenderness, cyanosis or clubbing  SKIN: warm and dry without lesions    NEURO:  alert, approp, no deficits       ekg 08/15/14  Atrial fib    I personally reviewed images and agree with radiology impression as follows:  CXR:  08/14/14 1. No evidence of a pulmonary embolism. 2. Findings consistent with congestive heart failure with cardiomegaly, interstitial thickening and moderate right and small left pleural effusions. There is also a small pericardial effusion. No convincing pneumonia. 3. Mild mediastinal adenopathy, likely reactive. 4. Small nodular densities in the lung bases. Largest measures 5 mm. If the patient is at high risk for bronchogenic carcinoma, follow-up chest CT at 6-12 months is recommended. If the patient is at low risk for bronchogenic carcinoma, follow-up chest CT at 12 months is Recommended.   Labs  reviewed:     Chemistry      Component Value Date/Time   NA 138 08/15/2014 0550   NA 141 05/08/2013 0950   K 3.3* 08/15/2014 0550   K 4.0 05/08/2013 0950   CL 100* 08/15/2014 0550   CL 102 04/21/2012 0925   CO2 28 08/15/2014 0550   CO2 31* 05/08/2013 0950   BUN 18 08/15/2014 0550   BUN 17.2 05/08/2013 0950   CREATININE 0.58 08/15/2014 0550   CREATININE 0.8 05/08/2013 0950   CREATININE 0.81 11/06/2012 0929      Component Value Date/Time   CALCIUM 9.7 08/15/2014 0550   CALCIUM 10.7* 05/08/2013 0950   ALKPHOS 65 08/15/2014 0550   ALKPHOS 66 05/08/2013 0950   AST 24 08/15/2014 0550   AST 18 05/08/2013 0950     ALT 16 08/15/2014 0550   ALT 16 05/08/2013 0950   BILITOT 1.0 08/15/2014 0550   BILITOT 0.54 05/08/2013 0950       alb 3.6 08/15/14  Lab Results  Component Value Date   WBC 5.4 08/15/2014   HGB 11.3* 08/15/2014   HCT 35.4* 08/15/2014   MCV 95.4 08/15/2014   PLT 163 08/15/2014              Assessment & Plan:

## 2014-08-26 NOTE — Patient Instructions (Addendum)
Add aldactone 25 mg one daily in addition to lasix   See Dr Fredderick Phenix or  Lynelle Smoke NP  by end of next week  For follow up kidney function and increase your diuretics if tolerated until able to lie flat or ankle swelling goes away, whichever comes first  Add needs tsh with bmet

## 2014-08-30 ENCOUNTER — Encounter: Payer: Self-pay | Admitting: Internal Medicine

## 2014-08-30 DIAGNOSIS — R918 Other nonspecific abnormal finding of lung field: Secondary | ICD-10-CM | POA: Insufficient documentation

## 2014-08-30 DIAGNOSIS — J90 Pleural effusion, not elsewhere classified: Secondary | ICD-10-CM | POA: Insufficient documentation

## 2014-08-30 NOTE — Assessment & Plan Note (Addendum)
I had an extended discussion with the patient reviewing all relevant studies completed to date and  lasting 35 minutes of a   on the following ongoing concerns:   Although there are over a 100 know causes for pleural effusion, when they are bilateral R > L assoc with interstial and peripheral edema and respond to inpt lasix they are almost certainly related to chf which she has already been documented to have  Discussed in detail all the  indications, usual  risks and alternatives  relative to the benefits with patient who agrees to proceed with conservative rx  rec add aldactone starting in very low doses and follow serial cxr/ bmet/bnp's   See instructions for specific recommendations which were reviewed directly with the patient who was given a copy with highlighter outlining the key components.   Will also need tsh to complete the w/u

## 2014-08-30 NOTE — Assessment & Plan Note (Signed)
Although there are clearly abnormalities on CT scan, they should probably be considered "microscopic" since not obvious on plain cxr .     In the setting of obvious "macroscopic" health issues,  I am very reluctatnt to embark on an invasive w/u at this point but will arrange consevative  follow up and in the meantime see what we can do to address the patient's subjective concerns.   

## 2014-08-30 NOTE — Assessment & Plan Note (Signed)
On eliquis with adequate ventricular rate control > no change needed other than adjust diurectics for the pleural effusions

## 2014-09-27 ENCOUNTER — Other Ambulatory Visit: Payer: Self-pay

## 2014-10-11 ENCOUNTER — Ambulatory Visit: Payer: Medicare Other | Admitting: Cardiovascular Disease

## 2015-01-24 ENCOUNTER — Telehealth: Payer: Self-pay | Admitting: Cardiovascular Disease

## 2015-01-25 NOTE — Telephone Encounter (Signed)
Close encounter 

## 2015-03-03 ENCOUNTER — Encounter: Payer: Self-pay | Admitting: Cardiovascular Disease

## 2015-03-03 ENCOUNTER — Ambulatory Visit (INDEPENDENT_AMBULATORY_CARE_PROVIDER_SITE_OTHER): Payer: Medicare Other | Admitting: Cardiovascular Disease

## 2015-03-03 VITALS — BP 120/60 | HR 71 | Ht 66.0 in | Wt 183.0 lb

## 2015-03-03 DIAGNOSIS — Z7901 Long term (current) use of anticoagulants: Secondary | ICD-10-CM | POA: Diagnosis not present

## 2015-03-03 DIAGNOSIS — I482 Chronic atrial fibrillation, unspecified: Secondary | ICD-10-CM

## 2015-03-03 DIAGNOSIS — I10 Essential (primary) hypertension: Secondary | ICD-10-CM

## 2015-03-03 DIAGNOSIS — J189 Pneumonia, unspecified organism: Secondary | ICD-10-CM

## 2015-03-03 DIAGNOSIS — Z853 Personal history of malignant neoplasm of breast: Secondary | ICD-10-CM

## 2015-03-03 NOTE — Patient Instructions (Addendum)
Your physician wants you to follow-up in: 1 year or sooner if needed. You will receive a reminder letter in the mail two months in advance. If you don't receive a letter, please call our office to schedule the follow-up appointment.   If you need a refill on your cardiac medications before your next appointment, please call your pharmacy.   

## 2015-03-03 NOTE — Progress Notes (Signed)
Patient ID: Alexandra Luna, female   DOB: February 22, 1927, 81 y.o.   MRN: [id number]      HPI: Alexandra Luna is a 81 y.o. female to the office today for a  One-year followup cardiology evaluation.   Alexandra Luna has a history of hypertension, palpitations, mixed hyperlipidemia and was found to be in atrial fibrillation of questionable duration last summer. Her last documented sinus rhythm had been in December 2013. When she was seen in June 2014 she was in atrial fibrillation. At that time she was started on Eliquis 2.5 twice a day for anticoagulation. An echo Doppler study showed vigorous systolic function. Tissue Doppler suggested elevated filling pressures. She had moderate mitral regurgitation, moderate LA dilatation, mild RA dilatation and moderate RV dilatation. She was initially treated with Propofenone which was subsequently titrated up to 225 twice a day. On 11/12/2012 she underwent successful cardioversion with one shock of 120 J with restoration of sinus rhythm.   When I saw her back in the office in followup for cardioversion, unfortunately, she was back in atrial fibrillation with a controlled ventricular rate. At that time, she was entirely asymptomatic we decided to discontinue her antiarrhythmic therapy and maintain her in permanent atrial fibrillation.  Proprofenone was dc'd and atenolol was titrated for rate control.   Since I last saw her, she tells me she had an episode of pneumonia in April.  In April she moved into [facility] assisted living and has been living there since.  She remains active. She has felt improved. She denies any awareness of her regular rhythm.  She denies PND, orthopnea.  She denies presyncope or syncope. She has been taking eloquence 2.5 mg twice a day for anticoagulation and atenolol 25 mg at bedtime for rate control.  Additional blood pressure medications include valsartan 40 mg daily, spironolactone 25 mg.  She is on Crestor 10 mg for hyperlipidemia.  She also  takes furosemide 20 mg and on this denies recurrent leg swelling.  She presents for follow-up evaluation.  Past Medical History  Diagnosis Date  . Renal hypertension 03/07/2005    no evidence of significant diameter reduction, dissection, tortuosity, FMD or orther vascular abnormality; kidneys equal in size, symmetry, with normal cortex and medulla  . Palpitations   . Mixed hyperlipidemia   . Anxiety   . Atrial fibrillation (HCC)     On chronic Eliquis  . Breast cancer ([place]) dx'd 1997    left; lt lumpectomy and XRT  . Pre-diabetes     Past Surgical History  Procedure Laterality Date  . Breast lumpectomy    . Tonsillectomy    . Cesarean section      x 3  . Doppler echocardiography  08/29/2011    EF >40%;  LV systolic fcn normal; doppler flow pattern suggestive of impaired LV relaxation; LA mildly dilated; aortic valve mildly sclerotic, no aortic stenosis  . Cardiovascular stress test  526/2011    R/Dipyridamole - EF 71%; normal myocaridal perfusion w/o evidence of ischemia or infarct; no schintigraphic evidence of inducible myocaridal ischemia; global LV function normal; EKG negative for ischemia; low risk scan   . Cardioversion N/A 11/12/2012    Procedure: CARDIOVERSION;  Surgeon: Troy Sine, MD;  Location: [facility] ENDOSCOPY;  Service: Cardiovascular;  Laterality: N/A;  . Breast lumpectomy  1998    left side    Allergies  Allergen Reactions  . Influenza Vaccines Other (See Comments)    Pt's arm swelled and dr told her not to  take vaccine again.  Preston Fleeting [Trandolapril-Verapamil Hcl Er] Other (See Comments)    Increase blood pressure  . Adhesive [Tape] Rash    Adhesive bandaids   . Azithromycin Rash  . Lipitor [Atorvastatin] Rash  . Penicillins Rash    Current Outpatient Prescriptions  Medication Sig Dispense Refill  . acetaminophen (TYLENOL) 500 MG tablet Take 1,000 mg by mouth at bedtime.     Marland Kitchen apixaban (ELIQUIS) 2.5 MG TABS tablet Take 1 tablet (2.5 mg total) by mouth 2  (two) times daily. 60 tablet 6  . atenolol (TENORMIN) 25 MG tablet Take 1 tablet (25 mg total) by mouth at bedtime. 30 tablet 0  . calcium carbonate (OS-CAL) 600 MG TABS Take 600 mg by mouth 2 (two) times daily with a meal.      . cetirizine (ZYRTEC) 10 MG tablet Take 10 mg by mouth daily.    . feeding supplement, ENSURE ENLIVE, (ENSURE ENLIVE) LIQD Take 237 mLs by mouth 2 (two) times daily between meals. 237 mL 12  . fluticasone (FLONASE) 50 MCG/ACT nasal spray Place 1 spray into both nostrils at bedtime.     . furosemide (LASIX) 20 MG tablet Take 1 tablet (20 mg total) by mouth daily. 30 tablet 0  . Glycerin-Hypromellose-PEG 400 (VISINE TEARS OP) Place 2 drops into both eyes 2 (two) times daily.    . Meclizine HCl (BONINE PO) Take 0.5-1 tablets by mouth daily as needed (dizziness).     . Multiple Vitamin (MULTIVITAMIN) capsule Take 1 capsule by mouth daily.    . Multiple Vitamins-Iron (MULTIVITAMINS WITH IRON) TABS tablet Take 1 tablet by mouth daily.    . Multiple Vitamins-Minerals (ICAPS AREDS 2 PO) Take by mouth daily.    . ondansetron (ZOFRAN) 4 MG tablet Take 1 tablet (4 mg total) by mouth every 6 (six) hours as needed for nausea. 20 tablet 0  . rosuvastatin (CRESTOR) 10 MG tablet Take 1 tablet (10 mg total) by mouth every other day. 30 tablet 5  . spironolactone (ALDACTONE) 25 MG tablet Take 1 tablet by mouth daily.    . valsartan (DIOVAN) 40 MG tablet Take 1 tablet (40 mg total) by mouth daily. 30 tablet 8  . verapamil (VERELAN PM) 240 MG 24 hr capsule Take 1 capsule (240 mg total) by mouth at bedtime. 90 capsule 3   No current facility-administered medications for this visit.    Social History   Social History  . Marital Status: Widowed    Spouse Name: N/A  . Number of Children: 3  . Years of Education: N/A   Occupational History  . Retired Pharmacist, hospital    Social History Main Topics  . Smoking status: Former Smoker -- 0.75 packs/day for 10 years    Types: Cigarettes    Quit  date: 04/02/1988  . Smokeless tobacco: Never Used  . Alcohol Use: 0.0 oz/week    0 Standard drinks or equivalent per week     Comment: 1/4 glass of wine every 2 weeks  . Drug Use: No  . Sexual Activity: Not on file   Other Topics Concern  . Not on file   Social History Narrative   Widowed.  Lives alone.  Does not have any home health services (hasn't wanted anyone in the house).  Ambulates with a cane or walker.   She is widowed.  She quit tobacco in 1994.  She does read avidly.  Family History  Problem Relation Age of Onset  . Heart failure Mother   .  Breast cancer Mother   . Hypertension Father   . Kidney cancer Father   . Stroke Maternal Grandmother    ROS General: Negative; No fevers, chills, or night sweats;  HEENT: Negative; No changes in vision or hearing, sinus congestion, difficulty swallowing Pulmonary:  Hospital for an episode of pneumonia in April 2016 Cardiovascular: Negative; No chest pain, presyncope, syncope, palpitations GI: Negative; No nausea, vomiting, diarrhea, or abdominal pain GU: Negative; No dysuria, hematuria, or difficulty voiding Musculoskeletal: Negative; no myalgias, joint pain, or weakness Hematologic/Oncology: Negative; no easy bruising, bleeding , on eloquent Endocrine: Negative; no heat/cold intolerance; no diabetes Neuro: Negative; no changes in balance, headaches Skin: Negative; No rashes or skin lesions Psychiatric: Negative; No behavioral problems, depression Sleep: Negative; No snoring, daytime sleepiness, hypersomnolence, bruxism, restless legs, hypnogognic hallucinations, no cataplexy Other comprehensive 14 point system review is negative.   PE BP 120/60 mmHg  Pulse 71  Ht $R'5\' 6"'wP$  (1.676 m)  Wt 183 lb (83.008 kg)  BMI 29.55 kg/m2   Wt Readings from Last 3 Encounters:  03/03/15 183 lb (83.008 kg)  08/26/14 174 lb (78.926 kg)  08/15/14 172 lb 3.2 oz (78.109 kg)   General: Alert, oriented, no distress.  Skin: normal turgor, no  rashes HEENT: Normocephalic, atraumatic. Pupils round and reactive; sclera anicteric;no lid lag.  Nose without nasal septal hypertrophy Mouth/Parynx benign; Mallinpatti scale 3 Neck: No JVD, no carotid bruits with normal upstroke Lungs: clear to ausculatation and percussion; no wheezing or rales Chest wall: Nontender to palpation Heart: Irregularly irregular rhythm with a rate in the 50s in atrial fibrillation, s1 s2 normal ; 1/6 systolic murmur; no diastolic murmur.  No rubs, thrills or heaves Abdomen: soft, nontender; no hepatosplenomehaly, BS+; abdominal aorta nontender and not dilated by palpation. Back: No CVA tenderness Pulses 2+ Extremities: no clubbing cyanosis or edema, Homan's sign negative  Neurologic: grossly nonfocal Psychological: Normal affect and mood.  She is improved since several years have elapsed since her husbands death.  ECG (independently read by me):  Atrial fibrillation at 71 bpm. Nonspecific ST changes.  QTc interval 443 ms.  December 2015ECG (independently read by me): Atrial fibrillation with ventricular response in the low 50s at an average rate of 51 bpm.  Rightward axis.  No significant ST-T changes.  09/30/2013 ECG (independently read by me): Atrial fibrillation with a slow ventricular rate at 52.  Nonspecific ST changes.  QTc interval 402 ms.  November 2014 ECG: Atrial fibrillation rate ~ 70.  Nonspecific ST-T changes; rare PVC.  LABS: BMP Latest Ref Rng 08/15/2014 08/14/2014 06/29/2014  Glucose 65 - 99 mg/dL 111(H) 121(H) 108(H)  BUN 6 - 20 mg/dL 18 16 28(H)  Creatinine 0.44 - 1.00 mg/dL 0.58 0.50 0.65  Sodium 135 - 145 mmol/L 138 138 134(L)  Potassium 3.5 - 5.1 mmol/L 3.3(L) 3.5 3.7  Chloride 101 - 111 mmol/L 100(L) 103 100  CO2 22 - 32 mmol/L 28 - 25  Calcium 8.9 - 10.3 mg/dL 9.7 - 9.5   Hepatic Function Latest Ref Rng 08/15/2014 06/28/2014 06/27/2014  Total Protein 6.5 - 8.1 g/dL 6.5 7.3 7.5  Albumin 3.5 - 5.0 g/dL 3.6 3.8 4.0  AST 15 - 41 U/L 24  35 25  ALT 14 - 54 U/L $Remo'16 17 15  'IoeBS$ Alk Phosphatase 38 - 126 U/L 65 64 77  Total Bilirubin 0.3 - 1.2 mg/dL 1.0 1.1 0.8   CBC Latest Ref Rng 08/15/2014 08/14/2014 08/14/2014  WBC 4.0 - 10.5 K/uL 5.4 - 8.6  Hemoglobin  12.0 - 15.0 g/dL 11.3(L) 12.9 11.7(L)  Hematocrit 36.0 - 46.0 % 35.4(L) 38.0 36.1  Platelets 150 - 400 K/uL 163 - 156   Lab Results  Component Value Date   MCV 95.4 08/15/2014   MCV 96.0 08/14/2014   MCV 92.8 06/29/2014   Lab Results  Component Value Date   TSH 2.451 10/08/2012   No results found for: HGBA1C  Lipid Panel     Component Value Date/Time   CHOL 118 10/08/2012 0910   TRIG 124 10/08/2012 0910   HDL 50 10/08/2012 0910   CHOLHDL 2.4 10/08/2012 0910   VLDL 25 10/08/2012 0910   LDLCALC 43 10/08/2012 0910     ASSESSMENT AND PLAN: Ms. Alicia is an 81 year old female who will be turning 57 tomorrow.  She is in permanent atrial fibrillation with rate control.   I have previously reduced her atenolol dose and now her rate is well-controlled at 25 mg at bedtime without bradycardia.  She is tolerating eliquis 2.5 mg twice a day  For anticoagulation and denies any bleeding episodes.  She is well compensated and is not having any heart failure symptoms.  Her blood pressure is well controlled on current therapy.  She is taking for hyperlipidemia.  He denies myalgias. She is not having any edema on her current dose of furosemide She is followed by Dr. Fredderick Phenix at [facility] with laboratory.  I recommended she continue her current medical regimen.  I will see her in one year for reevaluation.  Time spent: 25 minutes  Troy Sine, MD, [facility]  03/03/2015 6:22 PM

## 2015-03-20 ENCOUNTER — Other Ambulatory Visit (HOSPITAL_COMMUNITY): Payer: Medicare Other

## 2015-03-20 ENCOUNTER — Emergency Department (HOSPITAL_COMMUNITY): Payer: Medicare Other

## 2015-03-20 ENCOUNTER — Emergency Department (HOSPITAL_COMMUNITY)
Admission: EM | Admit: 2015-03-20 | Discharge: 2015-03-21 | Disposition: A | Discharge status: Home / Self Care | Payer: Medicare Other | Attending: Emergency Medicine | Admitting: Emergency Medicine

## 2015-03-20 ENCOUNTER — Encounter (HOSPITAL_COMMUNITY): Payer: Self-pay | Admitting: Nurse Practitioner

## 2015-03-20 DIAGNOSIS — Z87891 Personal history of nicotine dependence: Secondary | ICD-10-CM | POA: Diagnosis not present

## 2015-03-20 DIAGNOSIS — F419 Anxiety disorder, unspecified: Secondary | ICD-10-CM | POA: Diagnosis not present

## 2015-03-20 DIAGNOSIS — R110 Nausea: Secondary | ICD-10-CM | POA: Diagnosis present

## 2015-03-20 DIAGNOSIS — Z853 Personal history of malignant neoplasm of breast: Secondary | ICD-10-CM | POA: Diagnosis not present

## 2015-03-20 DIAGNOSIS — Z7902 Long term (current) use of antithrombotics/antiplatelets: Secondary | ICD-10-CM | POA: Diagnosis not present

## 2015-03-20 DIAGNOSIS — I4891 Unspecified atrial fibrillation: Secondary | ICD-10-CM | POA: Insufficient documentation

## 2015-03-20 DIAGNOSIS — Z79899 Other long term (current) drug therapy: Secondary | ICD-10-CM | POA: Insufficient documentation

## 2015-03-20 DIAGNOSIS — R197 Diarrhea, unspecified: Secondary | ICD-10-CM | POA: Diagnosis not present

## 2015-03-20 DIAGNOSIS — R630 Anorexia: Secondary | ICD-10-CM | POA: Diagnosis not present

## 2015-03-20 DIAGNOSIS — I10 Essential (primary) hypertension: Secondary | ICD-10-CM | POA: Insufficient documentation

## 2015-03-20 DIAGNOSIS — R5383 Other fatigue: Secondary | ICD-10-CM | POA: Insufficient documentation

## 2015-03-20 DIAGNOSIS — I509 Heart failure, unspecified: Secondary | ICD-10-CM | POA: Diagnosis not present

## 2015-03-20 DIAGNOSIS — R6883 Chills (without fever): Secondary | ICD-10-CM | POA: Insufficient documentation

## 2015-03-20 DIAGNOSIS — R51 Headache: Secondary | ICD-10-CM | POA: Diagnosis not present

## 2015-03-20 DIAGNOSIS — R42 Dizziness and giddiness: Secondary | ICD-10-CM | POA: Diagnosis not present

## 2015-03-20 DIAGNOSIS — Z880 Allergy status to penicillin: Secondary | ICD-10-CM | POA: Insufficient documentation

## 2015-03-20 DIAGNOSIS — Z7951 Long term (current) use of inhaled steroids: Secondary | ICD-10-CM | POA: Diagnosis not present

## 2015-03-20 DIAGNOSIS — E782 Mixed hyperlipidemia: Secondary | ICD-10-CM | POA: Insufficient documentation

## 2015-03-20 LAB — URINALYSIS, ROUTINE W REFLEX MICROSCOPIC
Bilirubin Urine: NEGATIVE
Glucose, UA: NEGATIVE mg/dL
Hgb urine dipstick: NEGATIVE
KETONES UR: NEGATIVE mg/dL
Leukocytes, UA: NEGATIVE
NITRITE: NEGATIVE
PH: 5.5 (ref 5.0–8.0)
Protein, ur: NEGATIVE mg/dL
Specific Gravity, Urine: 1.024 (ref 1.005–1.030)

## 2015-03-20 LAB — CBC WITH DIFFERENTIAL/PLATELET
BASOS ABS: 0 10*3/uL (ref 0.0–0.1)
BASOS PCT: 0 %
EOS PCT: 0 %
Eosinophils Absolute: 0.1 10*3/uL (ref 0.0–0.7)
HCT: 43.2 % (ref 36.0–46.0)
Hemoglobin: 14.2 g/dL (ref 12.0–15.0)
Lymphocytes Relative: 4 %
Lymphs Abs: 0.7 10*3/uL (ref 0.7–4.0)
MCH: 32.1 pg (ref 26.0–34.0)
MCHC: 32.9 g/dL (ref 30.0–36.0)
MCV: 97.7 fL (ref 78.0–100.0)
Monocytes Absolute: 0.9 10*3/uL (ref 0.1–1.0)
Monocytes Relative: 5 %
Neutro Abs: 14.7 10*3/uL — ABNORMAL HIGH (ref 1.7–7.7)
Neutrophils Relative %: 91 %
Platelets: 195 10*3/uL (ref 150–400)
RBC: 4.42 MIL/uL (ref 3.87–5.11)
RDW: 14 % (ref 11.5–15.5)
WBC: 16.3 10*3/uL — ABNORMAL HIGH (ref 4.0–10.5)

## 2015-03-20 LAB — C DIFFICILE QUICK SCREEN W PCR REFLEX
C DIFFICLE (CDIFF) ANTIGEN: NEGATIVE
C Diff interpretation: NEGATIVE
C Diff toxin: NEGATIVE

## 2015-03-20 LAB — TROPONIN I: Troponin I: <0.03 ng/mL (ref <0.031)

## 2015-03-20 LAB — COMPREHENSIVE METABOLIC PANEL
ALBUMIN: 4.2 g/dL (ref 3.5–5.0)
ALT: 16 U/L (ref 14–54)
AST: 33 U/L (ref 15–41)
Alkaline Phosphatase: 76 U/L (ref 38–126)
Anion gap: 9 (ref 5–15)
BILIRUBIN TOTAL: 1.4 mg/dL — AB (ref 0.3–1.2)
BUN: 28 mg/dL — ABNORMAL HIGH (ref 6–20)
CHLORIDE: 99 mmol/L — AB (ref 101–111)
CO2: 26 mmol/L (ref 22–32)
Calcium: 10.2 mg/dL (ref 8.9–10.3)
Creatinine, Ser: 0.96 mg/dL (ref 0.44–1.00)
GFR calc Af Amer: >60 mL/min (ref >60)
GFR calc non Af Amer: 52 mL/min — ABNORMAL LOW (ref >60)
GLUCOSE: 113 mg/dL — AB (ref 65–99)
POTASSIUM: 5.8 mmol/L — AB (ref 3.5–5.1)
SODIUM: 134 mmol/L — AB (ref 135–145)
TOTAL PROTEIN: 7.6 g/dL (ref 6.5–8.1)

## 2015-03-20 LAB — PROTIME-INR
INR: 1.11 (ref 0.00–1.49)
Prothrombin Time: 14.5 seconds (ref 11.6–15.2)

## 2015-03-20 LAB — LIPASE, BLOOD: Lipase: 71 U/L — ABNORMAL HIGH (ref 11–51)

## 2015-03-20 LAB — I-STAT CG4 LACTIC ACID, ED: Lactic Acid, Venous: 1.95 mmol/L (ref 0.5–2.0)

## 2015-03-20 MED ORDER — IOHEXOL 300 MG/ML  SOLN
100.0000 mL | Freq: Once | INTRAMUSCULAR | Status: AC | PRN
  Administered 2015-03-20: 100 mL via INTRAVENOUS

## 2015-03-20 MED ORDER — ONDANSETRON HCL 4 MG/2ML IJ SOLN
4.0000 mg | Freq: Four times a day (QID) | INTRAMUSCULAR | Status: DC | PRN
  Administered 2015-03-20: 4 mg via INTRAVENOUS
  Filled 2015-03-20: qty 2

## 2015-03-20 MED ORDER — SODIUM CHLORIDE 0.9 % IV BOLUS (SEPSIS)
500.0000 mL | Freq: Once | INTRAVENOUS | Status: AC
  Administered 2015-03-20: 500 mL via INTRAVENOUS

## 2015-03-20 MED ORDER — METOCLOPRAMIDE HCL 5 MG/ML IJ SOLN
5.0000 mg | Freq: Once | INTRAMUSCULAR | Status: AC
  Administered 2015-03-20: 5 mg via INTRAVENOUS
  Filled 2015-03-20: qty 2

## 2015-03-20 MED ORDER — IOHEXOL 300 MG/ML  SOLN
25.0000 mL | Freq: Once | INTRAMUSCULAR | Status: AC | PRN
  Administered 2015-03-20: 25 mL via ORAL

## 2015-03-20 NOTE — ED Notes (Signed)
Called main lab to request lab draw for pending I-Stat.

## 2015-03-20 NOTE — ED Notes (Signed)
Patient transported to X-ray 

## 2015-03-20 NOTE — ED Notes (Signed)
Pt has had large incontinent loose stool in restroom.

## 2015-03-20 NOTE — ED Provider Notes (Signed)
CSN: [id number]     Arrival date & time 03/20/15  1503 History   First MD Initiated Contact with Patient 03/20/15 1517     Chief Complaint  Patient presents with  . Generalized Body Aches  . Nausea     (Consider location/radiation/quality/duration/timing/severity/associated sxs/prior Treatment) HPI Comments: 82 y.o. Female with history of HTN, hyperlipidemia, CHF, atrial fibrillation presents for nausea and body aches.  The patient reports that she started feeling nauseous yesterday and has not had much of an appetite since that time.  She reports that she she also has felt achy all over the body and today when she stands up she feels light headed.  She says that she had similar symptoms when she was diagnosed with pneumonia in the past.  Denies cough, fever, chest pain, shortness of breath.  She reports that other people in her living facility have had similar symptoms.   Past Medical History  Diagnosis Date  . Renal hypertension 03/07/2005    no evidence of significant diameter reduction, dissection, tortuosity, FMD or orther vascular abnormality; kidneys equal in size, symmetry, with normal cortex and medulla  . Palpitations   . Mixed hyperlipidemia   . Anxiety   . Atrial fibrillation (HCC)     On chronic Eliquis  . Breast cancer ([place]) dx'd 1997    left; lt lumpectomy and XRT  . Pre-diabetes    Past Surgical History  Procedure Laterality Date  . Breast lumpectomy    . Tonsillectomy    . Cesarean section      x 3  . Doppler echocardiography  08/29/2011    EF [id number];  LV systolic fcn normal; doppler flow pattern suggestive of impaired LV relaxation; LA mildly dilated; aortic valve mildly sclerotic, no aortic stenosis  . Cardiovascular stress test  526/2011    R/Dipyridamole - EF 71%; normal myocaridal perfusion w/o evidence of ischemia or infarct; no schintigraphic evidence of inducible myocaridal ischemia; global LV function normal; EKG negative for ischemia; low risk scan   .  Cardioversion N/A 11/12/2012    Procedure: CARDIOVERSION;  Surgeon: Troy Sine, MD;  Location: [facility] ENDOSCOPY;  Service: Cardiovascular;  Laterality: N/A;  . Breast lumpectomy  1998    left side   Family History  Problem Relation Age of Onset  . Heart failure Mother   . Breast cancer Mother   . Hypertension Father   . Kidney cancer Father   . Stroke Maternal Grandmother    Social History  Substance Use Topics  . Smoking status: Former Smoker -- 0.75 packs/day for 10 years    Types: Cigarettes    Quit date: 04/02/1988  . Smokeless tobacco: Never Used  . Alcohol Use: 0.0 oz/week    0 Standard drinks or equivalent per week     Comment: 1/4 glass of wine every 2 weeks   OB History    No data available     Review of Systems  Constitutional: Positive for chills, appetite change and fatigue. Negative for fever.  HENT: Negative for congestion, rhinorrhea, sinus pressure and sore throat.   Respiratory: Negative for cough, chest tightness and shortness of breath.   Cardiovascular: Negative for chest pain and palpitations.  Gastrointestinal: Positive for nausea. Negative for vomiting, abdominal pain, diarrhea and constipation.  Genitourinary: Negative for dysuria, urgency and hematuria.  Musculoskeletal: Positive for myalgias. Negative for back pain.  Skin: Negative for rash.  Neurological: Positive for light-headedness and headaches (mild, generalized). Negative for dizziness.  Hematological: Bruises/bleeds easily.  Allergies  Influenza vaccines; Wilber; Adhesive; Azithromycin; Lipitor; and Penicillins  Home Medications   Prior to Admission medications   Medication Sig Start Date End Date Taking? Authorizing Provider  acetaminophen (TYLENOL) 325 MG tablet Take 650 mg by mouth 2 (two) times daily as needed for mild pain.   Yes Historical Provider, MD  acetaminophen (TYLENOL) 500 MG tablet Take 1,000 mg by mouth at bedtime.    Yes Historical Provider, MD  apixaban (ELIQUIS)  2.5 MG TABS tablet Take 1 tablet (2.5 mg total) by mouth 2 (two) times daily. 12/23/13  Yes Troy Sine, MD  atenolol (TENORMIN) 25 MG tablet Take 1 tablet (25 mg total) by mouth at bedtime. 07/01/14  Yes Robbie Lis, MD  calcium carbonate (OS-CAL) 600 MG TABS Take 600 mg by mouth daily.    Yes Historical Provider, MD  cetirizine (ZYRTEC) 10 MG tablet Take 10 mg by mouth daily.   Yes Historical Provider, MD  feeding supplement, ENSURE ENLIVE, (ENSURE ENLIVE) LIQD Take 237 mLs by mouth 2 (two) times daily between meals. 07/01/14  Yes Robbie Lis, MD  fluticasone [facility]) 50 MCG/ACT nasal spray Place 1 spray into both nostrils at bedtime.  07/04/12  Yes Historical Provider, MD  furosemide (LASIX) 20 MG tablet Take 1 tablet (20 mg total) by mouth daily. 08/15/14  Yes Robbie Lis, MD  meclizine (ANTIVERT) 25 MG tablet Take 25 mg by mouth daily as needed for dizziness.   Yes Historical Provider, MD  Multiple Vitamins-Iron (MULTIVITAMINS WITH IRON) TABS tablet Take 1 tablet by mouth daily.   Yes Historical Provider, MD  Multiple Vitamins-Minerals (ICAPS AREDS 2 PO) Take by mouth daily.   Yes Historical Provider, MD  ondansetron (ZOFRAN) 4 MG tablet Take 1 tablet (4 mg total) by mouth every 6 (six) hours as needed for nausea. 08/15/14  Yes Robbie Lis, MD  rosuvastatin (CRESTOR) 10 MG tablet Take 1 tablet (10 mg total) by mouth every other day. 05/10/14  Yes Troy Sine, MD  spironolactone (ALDACTONE) 25 MG tablet Take 1 tablet by mouth daily. 02/17/15  Yes Historical Provider, MD  valsartan (DIOVAN) 40 MG tablet Take 1 tablet (40 mg total) by mouth daily. 02/15/14  Yes Troy Sine, MD  verapamil (VERELAN PM) 240 MG 24 hr capsule Take 1 capsule (240 mg total) by mouth at bedtime. 05/03/14  Yes Troy Sine, MD  metoCLOPramide (REGLAN) 10 MG tablet Take 1 tablet (10 mg total) by mouth every 8 (eight) hours as needed for nausea. 03/21/15   Harvel Quale, MD   BP 137/68 mmHg  Pulse 115   Temp(Src) 98.7 F (37.1 C) (Oral)  Resp 20  SpO2 97% Physical Exam  Constitutional: She is oriented to person, place, and time. She appears well-developed and well-nourished. No distress.  HENT:  Head: Normocephalic and atraumatic.  Right Ear: External ear normal.  Left Ear: External ear normal.  Nose: Nose normal.  Mouth/Throat: Oropharynx is clear and moist. Mucous membranes are dry. No oropharyngeal exudate.  Eyes: EOM are normal. Pupils are equal, round, and reactive to light.  Neck: Normal range of motion. Neck supple.  Cardiovascular: Normal rate, normal heart sounds and intact distal pulses.   No murmur heard. Pulmonary/Chest: Effort normal. No respiratory distress. She has no wheezes. She has no rales.  Abdominal: Soft. She exhibits no distension. There is no tenderness.  Musculoskeletal: Normal range of motion. She exhibits no edema or tenderness.  Neurological: She is alert and oriented  to person, place, and time.  Skin: Skin is warm and dry. No rash noted. She is not diaphoretic.  Vitals reviewed.   ED Course  Procedures (including critical care time) Labs Review Labs Reviewed  CBC WITH DIFFERENTIAL/PLATELET - Abnormal; Notable for the following:    WBC 16.3 (*)    Neutro Abs 14.7 (*)    All other components within normal limits  COMPREHENSIVE METABOLIC PANEL - Abnormal; Notable for the following:    Sodium 134 (*)    Potassium 5.8 (*)    Chloride 99 (*)    Glucose, Bld 113 (*)    BUN 28 (*)    Total Bilirubin 1.4 (*)    GFR calc non Af Amer 52 (*)    All other components within normal limits  LIPASE, BLOOD - Abnormal; Notable for the following:    Lipase 71 (*)    All other components within normal limits  C DIFFICILE QUICK SCREEN W PCR REFLEX  URINALYSIS, ROUTINE W REFLEX MICROSCOPIC (NOT AT [facility])  PROTIME-INR  TROPONIN I  I-STAT CG4 LACTIC ACID, ED  I-STAT CG4 LACTIC ACID, ED    Imaging Review No results found. I have personally reviewed and  evaluated these images and lab results as part of my medical decision-making.   EKG Interpretation   Date/Time:  Sunday March 20 2015 16:27:13 EST Ventricular Rate:  121 PR Interval:    QRS Duration: 94 QT Interval:  330 QTC Calculation: 468 R Axis:   107 Text Interpretation:  Atrial fibrillation Right axis deviation Borderline  repolarization abnormality Rate increased from previous Confirmed by  NGUYEN, EMILY ([id number]) on 03/20/2015 5:03:14 PM      MDM  Patient was seen and evaluated in stable condition.  Labs unremarkable for patient.  CT without acute finding.  Patient was able to ambulate and reported feeling improved after multiple large stools.  Patient was able to tolerate PO.  Discussed with patient option of discharge vs admission.  She stated that she felt well enough and preferred to return to her home.  She was given strict return precautions and was discharged in stable condition. Final diagnoses:  Nausea  Diarrhea, unspecified type    1. Nausea  2. Acute diarrhea    Harvel Quale, MD 03/22/15 2056

## 2015-03-20 NOTE — ED Notes (Signed)
Pt ambulated to restroom without difficulty.  1-assist for safety.

## 2015-03-20 NOTE — ED Notes (Signed)
Attempted I-Stat, unable to obtain.

## 2015-03-20 NOTE — ED Notes (Signed)
Pt from [facility] via EMS-Per EMS, pt reports N/V, body aches x2 days. EMS adds that several residents have had the same s/sx. Pt denies SOB/ CP. Pt received 4 mg Zofran po PTA from staff at facility. Pt is A&O and in NAD

## 2015-03-20 NOTE — ED Notes (Signed)
Bed: WA20 Expected date: 03/20/15 Expected time: 3:02 PM Means of arrival: Ambulance Comments: Flu like symptoms

## 2015-03-21 MED ORDER — METOCLOPRAMIDE HCL 10 MG PO TABS: 10.0000 mg | ORAL_TABLET | Freq: Three times a day (TID) | ORAL | Status: DC | PRN

## 2015-03-21 NOTE — Discharge Instructions (Signed)
You were seen today for your nausea.  If you develop fever or severe pain return immediately.  Rest and drink lots of fluids.  Eat a bland diet.  Return with worsening symptoms.  Follow up with your primary care physician.  Nausea, Adult Nausea is the feeling that you have an upset stomach or have to vomit. Nausea by itself is not likely a serious concern, but it may be an early sign of more serious medical problems. As nausea gets worse, it can lead to vomiting. If vomiting develops, there is the risk of dehydration.  CAUSES   Viral infections.  Food poisoning.  Medicines.  Pregnancy.  Motion sickness.  Migraine headaches.  Emotional distress.  Severe pain from any source.  Alcohol intoxication. HOME CARE INSTRUCTIONS  Get plenty of rest.  Ask your caregiver about specific rehydration instructions.  Eat small amounts of food and sip liquids more often.  Take all medicines as told by your caregiver. SEEK MEDICAL CARE IF:  You have not improved after 2 days, or you get worse.  You have a headache. SEEK IMMEDIATE MEDICAL CARE IF:   You have a fever.  You faint.  You keep vomiting or have blood in your vomit.  You are extremely weak or dehydrated.  You have dark or bloody stools.  You have severe chest or abdominal pain. MAKE SURE YOU:  Understand these instructions.  Will watch your condition.  Will get help right away if you are not doing well or get worse.   This information is not intended to replace advice given to you by your health care provider. Make sure you discuss any questions you have with your health care provider.   Document Released: 04/26/2004 Document Revised: 04/09/2014 Document Reviewed: 11/29/2010 Elsevier Interactive Patient Education 2016 [place].   Diarrhea Diarrhea is frequent loose and watery bowel movements. It can cause you to feel weak and dehydrated. Dehydration can cause you to become tired and thirsty, have a dry  mouth, and have decreased urination that often is dark yellow. Diarrhea is a sign of another problem, most often an infection that will not last long. In most cases, diarrhea typically lasts 2-3 days. However, it can last longer if it is a sign of something more serious. It is important to treat your diarrhea as directed by your caregiver to lessen or prevent future episodes of diarrhea. CAUSES  Some common causes include:  Gastrointestinal infections caused by viruses, bacteria, or parasites.  Food poisoning or food allergies.  Certain medicines, such as antibiotics, chemotherapy, and laxatives.  Artificial sweeteners and fructose.  Digestive disorders. HOME CARE INSTRUCTIONS  Ensure adequate fluid intake (hydration): Have 1 cup (8 oz) of fluid for each diarrhea episode. Avoid fluids that contain simple sugars or sports drinks, fruit juices, whole milk products, and sodas. Your urine should be clear or pale yellow if you are drinking enough fluids. Hydrate with an oral rehydration solution that you can purchase at pharmacies, retail stores, and online. You can prepare an oral rehydration solution at home by mixing the following ingredients together:   - tsp table salt.   tsp baking soda.   tsp salt substitute containing potassium chloride.  1  tablespoons sugar.  1 L (34 oz) of water.  Certain foods and beverages may increase the speed at which food moves through the gastrointestinal (GI) tract. These foods and beverages should be avoided and include:  Caffeinated and alcoholic beverages.  High-fiber foods, such as raw fruits and vegetables,  nuts, seeds, and whole grain breads and cereals.  Foods and beverages sweetened with sugar alcohols, such as xylitol, sorbitol, and mannitol.  Some foods may be well tolerated and may help thicken stool including:  Starchy foods, such as rice, toast, pasta, low-sugar cereal, oatmeal, grits, baked potatoes, crackers, and  bagels.  Bananas.  Applesauce.  Add probiotic-rich foods to help increase healthy bacteria in the GI tract, such as yogurt and fermented milk products.  Wash your hands well after each diarrhea episode.  Only take over-the-counter or prescription medicines as directed by your caregiver.  Take a warm bath to relieve any burning or pain from frequent diarrhea episodes. SEEK IMMEDIATE MEDICAL CARE IF:   You are unable to keep fluids down.  You have persistent vomiting.  You have blood in your stool, or your stools are black and tarry.  You do not urinate in 6-8 hours, or there is only a small amount of very dark urine.  You have abdominal pain that increases or localizes.  You have weakness, dizziness, confusion, or light-headedness.  You have a severe headache.  Your diarrhea gets worse or does not get better.  You have a fever or persistent symptoms for more than 2-3 days.  You have a fever and your symptoms suddenly get worse. MAKE SURE YOU:   Understand these instructions.  Will watch your condition.  Will get help right away if you are not doing well or get worse.   This information is not intended to replace advice given to you by your health care provider. Make sure you discuss any questions you have with your health care provider.   Document Released: 03/09/2002 Document Revised: 04/09/2014 Document Reviewed: 11/25/2011 Elsevier Interactive Patient Education [employer].

## 2015-04-01 ENCOUNTER — Encounter (HOSPITAL_COMMUNITY): Payer: Self-pay | Admitting: Emergency Medicine

## 2015-04-01 ENCOUNTER — Emergency Department (HOSPITAL_COMMUNITY): Payer: Medicare Other

## 2015-04-01 ENCOUNTER — Inpatient Hospital Stay (HOSPITAL_COMMUNITY)
Admission: EM | Admit: 2015-04-01 | Discharge: 2015-04-03 | DRG: 190 | Disposition: A | Discharge status: Nursing Home (Includes ICF) | Payer: Medicare Other | Attending: Family Medicine | Admitting: Family Medicine

## 2015-04-01 DIAGNOSIS — I5032 Chronic diastolic (congestive) heart failure: Secondary | ICD-10-CM | POA: Diagnosis present

## 2015-04-01 DIAGNOSIS — C50912 Malignant neoplasm of unspecified site of left female breast: Secondary | ICD-10-CM | POA: Diagnosis present

## 2015-04-01 DIAGNOSIS — R7303 Prediabetes: Secondary | ICD-10-CM | POA: Diagnosis present

## 2015-04-01 DIAGNOSIS — Z8051 Family history of malignant neoplasm of kidney: Secondary | ICD-10-CM | POA: Diagnosis not present

## 2015-04-01 DIAGNOSIS — R911 Solitary pulmonary nodule: Secondary | ICD-10-CM | POA: Diagnosis present

## 2015-04-01 DIAGNOSIS — Y95 Nosocomial condition: Secondary | ICD-10-CM | POA: Diagnosis present

## 2015-04-01 DIAGNOSIS — N189 Chronic kidney disease, unspecified: Secondary | ICD-10-CM | POA: Diagnosis present

## 2015-04-01 DIAGNOSIS — E876 Hypokalemia: Secondary | ICD-10-CM | POA: Diagnosis present

## 2015-04-01 DIAGNOSIS — R0902 Hypoxemia: Secondary | ICD-10-CM

## 2015-04-01 DIAGNOSIS — I517 Cardiomegaly: Secondary | ICD-10-CM | POA: Diagnosis present

## 2015-04-01 DIAGNOSIS — T380X5A Adverse effect of glucocorticoids and synthetic analogues, initial encounter: Secondary | ICD-10-CM | POA: Diagnosis present

## 2015-04-01 DIAGNOSIS — M810 Age-related osteoporosis without current pathological fracture: Secondary | ICD-10-CM | POA: Diagnosis present

## 2015-04-01 DIAGNOSIS — E782 Mixed hyperlipidemia: Secondary | ICD-10-CM | POA: Diagnosis present

## 2015-04-01 DIAGNOSIS — Z823 Family history of stroke: Secondary | ICD-10-CM | POA: Diagnosis not present

## 2015-04-01 DIAGNOSIS — Z7901 Long term (current) use of anticoagulants: Secondary | ICD-10-CM | POA: Diagnosis not present

## 2015-04-01 DIAGNOSIS — Z66 Do not resuscitate: Secondary | ICD-10-CM | POA: Diagnosis present

## 2015-04-01 DIAGNOSIS — F0390 Unspecified dementia without behavioral disturbance: Secondary | ICD-10-CM | POA: Diagnosis present

## 2015-04-01 DIAGNOSIS — J440 Chronic obstructive pulmonary disease with acute lower respiratory infection: Secondary | ICD-10-CM | POA: Diagnosis present

## 2015-04-01 DIAGNOSIS — Z79899 Other long term (current) drug therapy: Secondary | ICD-10-CM | POA: Diagnosis not present

## 2015-04-01 DIAGNOSIS — J189 Pneumonia, unspecified organism: Secondary | ICD-10-CM | POA: Diagnosis present

## 2015-04-01 DIAGNOSIS — R197 Diarrhea, unspecified: Secondary | ICD-10-CM | POA: Diagnosis present

## 2015-04-01 DIAGNOSIS — J9601 Acute respiratory failure with hypoxia: Secondary | ICD-10-CM | POA: Diagnosis present

## 2015-04-01 DIAGNOSIS — Z9109 Other allergy status, other than to drugs and biological substances: Secondary | ICD-10-CM

## 2015-04-01 DIAGNOSIS — Z881 Allergy status to other antibiotic agents status: Secondary | ICD-10-CM

## 2015-04-01 DIAGNOSIS — D72829 Elevated white blood cell count, unspecified: Secondary | ICD-10-CM | POA: Diagnosis present

## 2015-04-01 DIAGNOSIS — Z803 Family history of malignant neoplasm of breast: Secondary | ICD-10-CM | POA: Diagnosis not present

## 2015-04-01 DIAGNOSIS — I129 Hypertensive chronic kidney disease with stage 1 through stage 4 chronic kidney disease, or unspecified chronic kidney disease: Secondary | ICD-10-CM | POA: Diagnosis present

## 2015-04-01 DIAGNOSIS — J441 Chronic obstructive pulmonary disease with (acute) exacerbation: Principal | ICD-10-CM | POA: Diagnosis present

## 2015-04-01 DIAGNOSIS — Z887 Allergy status to serum and vaccine status: Secondary | ICD-10-CM

## 2015-04-01 DIAGNOSIS — I482 Chronic atrial fibrillation: Secondary | ICD-10-CM | POA: Diagnosis present

## 2015-04-01 DIAGNOSIS — I700 Atherosclerosis of aorta: Secondary | ICD-10-CM | POA: Diagnosis present

## 2015-04-01 DIAGNOSIS — Z8249 Family history of ischemic heart disease and other diseases of the circulatory system: Secondary | ICD-10-CM

## 2015-04-01 DIAGNOSIS — K5900 Constipation, unspecified: Secondary | ICD-10-CM | POA: Diagnosis present

## 2015-04-01 DIAGNOSIS — Z880 Allergy status to penicillin: Secondary | ICD-10-CM

## 2015-04-01 DIAGNOSIS — R0602 Shortness of breath: Secondary | ICD-10-CM | POA: Diagnosis present

## 2015-04-01 LAB — CBC
HEMATOCRIT: 40.3 % (ref 36.0–46.0)
Hemoglobin: 13.3 g/dL (ref 12.0–15.0)
MCH: 31.4 pg (ref 26.0–34.0)
MCHC: 33 g/dL (ref 30.0–36.0)
MCV: 95.3 fL (ref 78.0–100.0)
PLATELETS: 315 10*3/uL (ref 150–400)
RBC: 4.23 MIL/uL (ref 3.87–5.11)
RDW: 13.9 % (ref 11.5–15.5)
WBC: 13.9 10*3/uL — ABNORMAL HIGH (ref 4.0–10.5)

## 2015-04-01 LAB — MRSA PCR SCREENING: MRSA BY PCR: NEGATIVE

## 2015-04-01 LAB — URINALYSIS, ROUTINE W REFLEX MICROSCOPIC
BILIRUBIN URINE: NEGATIVE
GLUCOSE, UA: NEGATIVE mg/dL
HGB URINE DIPSTICK: NEGATIVE
KETONES UR: NEGATIVE mg/dL
LEUKOCYTES UA: NEGATIVE
Nitrite: NEGATIVE
PH: 5.5 (ref 5.0–8.0)
PROTEIN: NEGATIVE mg/dL
Specific Gravity, Urine: 1.012 (ref 1.005–1.030)

## 2015-04-01 LAB — BASIC METABOLIC PANEL
Anion gap: 10 (ref 5–15)
BUN: 19 mg/dL (ref 6–20)
CHLORIDE: 95 mmol/L — AB (ref 101–111)
CO2: 28 mmol/L (ref 22–32)
CREATININE: 0.72 mg/dL (ref 0.44–1.00)
Calcium: 10.7 mg/dL — ABNORMAL HIGH (ref 8.9–10.3)
GFR calc Af Amer: >60 mL/min (ref >60)
GFR calc non Af Amer: >60 mL/min (ref >60)
GLUCOSE: 130 mg/dL — AB (ref 65–99)
POTASSIUM: 4.7 mmol/L (ref 3.5–5.1)
SODIUM: 133 mmol/L — AB (ref 135–145)

## 2015-04-01 LAB — BRAIN NATRIURETIC PEPTIDE: B NATRIURETIC PEPTIDE 5: 265.8 pg/mL — AB (ref 0.0–100.0)

## 2015-04-01 LAB — CBG MONITORING, ED: Glucose-Capillary: 123 mg/dL — ABNORMAL HIGH (ref 65–99)

## 2015-04-01 MED ORDER — SODIUM CHLORIDE 0.9 % IV BOLUS (SEPSIS)
500.0000 mL | Freq: Once | INTRAVENOUS | Status: AC
  Administered 2015-04-01: 500 mL via INTRAVENOUS

## 2015-04-01 MED ORDER — VANCOMYCIN HCL IN DEXTROSE 1-5 GM/200ML-% IV SOLN: 1000.0000 mg | Freq: Once | INTRAVENOUS | Status: DC

## 2015-04-01 MED ORDER — DOXYCYCLINE HYCLATE 100 MG PO TABS
100.0000 mg | ORAL_TABLET | Freq: Two times a day (BID) | ORAL | Status: DC
  Administered 2015-04-01 – 2015-04-03 (×4): 100 mg via ORAL
  Filled 2015-04-01 (×4): qty 1

## 2015-04-01 MED ORDER — APIXABAN 2.5 MG PO TABS
2.5000 mg | ORAL_TABLET | Freq: Two times a day (BID) | ORAL | Status: DC
  Administered 2015-04-01 – 2015-04-03 (×4): 2.5 mg via ORAL
  Filled 2015-04-01 (×4): qty 1

## 2015-04-01 MED ORDER — DEXTROSE 5 % IV SOLN
2.0000 g | Freq: Once | INTRAVENOUS | Status: AC
  Administered 2015-04-01: 2 g via INTRAVENOUS
  Filled 2015-04-01: qty 2

## 2015-04-01 MED ORDER — FUROSEMIDE 20 MG PO TABS
20.0000 mg | ORAL_TABLET | Freq: Every day | ORAL | Status: DC
  Administered 2015-04-02 – 2015-04-03 (×2): 20 mg via ORAL
  Filled 2015-04-01 (×2): qty 1

## 2015-04-01 MED ORDER — IPRATROPIUM-ALBUTEROL 0.5-2.5 (3) MG/3ML IN SOLN
3.0000 mL | Freq: Once | RESPIRATORY_TRACT | Status: AC
  Administered 2015-04-01: 3 mL via RESPIRATORY_TRACT
  Filled 2015-04-01: qty 3

## 2015-04-01 MED ORDER — IPRATROPIUM-ALBUTEROL 0.5-2.5 (3) MG/3ML IN SOLN
3.0000 mL | Freq: Four times a day (QID) | RESPIRATORY_TRACT | Status: DC
  Administered 2015-04-02 – 2015-04-03 (×5): 3 mL via RESPIRATORY_TRACT
  Filled 2015-04-01 (×5): qty 3

## 2015-04-01 MED ORDER — ALBUTEROL SULFATE (2.5 MG/3ML) 0.083% IN NEBU: 2.5000 mg | INHALATION_SOLUTION | RESPIRATORY_TRACT | Status: DC | PRN

## 2015-04-01 MED ORDER — IPRATROPIUM-ALBUTEROL 0.5-2.5 (3) MG/3ML IN SOLN
3.0000 mL | RESPIRATORY_TRACT | Status: DC
  Administered 2015-04-01: 3 mL via RESPIRATORY_TRACT
  Filled 2015-04-01: qty 3

## 2015-04-01 MED ORDER — MECLIZINE HCL 25 MG PO TABS
25.0000 mg | ORAL_TABLET | Freq: Every day | ORAL | Status: DC | PRN
  Filled 2015-04-01: qty 1

## 2015-04-01 MED ORDER — NAPHAZOLINE HCL 0.1 % OP SOLN
2.0000 [drp] | Freq: Two times a day (BID) | OPHTHALMIC | Status: DC | PRN
  Filled 2015-04-01: qty 15

## 2015-04-01 MED ORDER — VERAPAMIL HCL ER 240 MG PO TBCR
240.0000 mg | EXTENDED_RELEASE_TABLET | Freq: Every day | ORAL | Status: DC
  Administered 2015-04-02: 240 mg via ORAL
  Filled 2015-04-01: qty 1

## 2015-04-01 MED ORDER — METHYLPREDNISOLONE SODIUM SUCC 125 MG IJ SOLR
80.0000 mg | Freq: Three times a day (TID) | INTRAMUSCULAR | Status: DC
  Administered 2015-04-01 – 2015-04-03 (×5): 80 mg via INTRAVENOUS
  Filled 2015-04-01 (×6): qty 1.28

## 2015-04-01 MED ORDER — SPIRONOLACTONE 25 MG PO TABS
25.0000 mg | ORAL_TABLET | Freq: Every day | ORAL | Status: DC
  Administered 2015-04-02 – 2015-04-03 (×2): 25 mg via ORAL
  Filled 2015-04-01 (×2): qty 1

## 2015-04-01 MED ORDER — SODIUM CHLORIDE 0.9 % IJ SOLN
3.0000 mL | Freq: Two times a day (BID) | INTRAMUSCULAR | Status: DC
  Administered 2015-04-01 – 2015-04-02 (×3): 3 mL via INTRAVENOUS

## 2015-04-01 MED ORDER — ATENOLOL 25 MG PO TABS
25.0000 mg | ORAL_TABLET | Freq: Every day | ORAL | Status: DC
  Administered 2015-04-01 – 2015-04-02 (×2): 25 mg via ORAL
  Filled 2015-04-01 (×2): qty 1

## 2015-04-01 NOTE — H&P (Signed)
[facility] History and Physical  Alexandra Luna [id number] DOB: May 28, 1926 DOA: 04/01/2015       88 ? from ALF Permanent Afib, CHad2Vasc2~4 Pulm nodules Stg 1 L Breast Ca-prior on tamoxifene, Femara Htn HLd Hypok TCP ? Mild dementia  Cough and SOB 2 weeks-initially came to the hospital 12 1 16  nausea vomiting diarrhea viral illness  Discharged went home goes to an assisted living and gets some care there . People were sick at that facility and around 12/14 was sent to the urgent care with cough fever chills Placed on inhaler and  Finished 9 days of levaquin   on 12/27 started feeling poorly, weaker and not eating much    no chest pain no blurred vision no double vision No further nausea vomiting No diarrhea Has not felt more swollen than usual No fever no HF symptoms  EMS gave 1 neb in ALF 2 more nebs here in the emergency room  desatted to 60 %  while in ED   New oxyen  requirmnent  Wbc 13Sodium 133 BUN/creatinine 19/ 0.7 glucose 130 Urinalysis negative Chest x-ray = chronic cardiomegaly and tortuosity with calcification of aorta no acute infiltrate EKG rate controlled A. 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 Renal hypertension 03/07/2005    no evidence of significant diameter reduction, dissection, tortuosity, FMD or orther vascular abnormality; kidneys equal in size, symmetry, with normal cortex and medulla  . Palpitations   . Mixed hyperlipidemia   . Anxiety   . Atrial fibrillation (HCC)     On chronic Eliquis  . Breast cancer ([place]) dx'd 1997    left; lt lumpectomy and XRT  . Pre-diabetes    Past Surgical History  Procedure Laterality Date  . Breast lumpectomy    . Tonsillectomy    . Cesarean section       x 3  . Doppler echocardiography  08/29/2011    EF [id number];  LV systolic fcn normal; doppler flow pattern suggestive of impaired LV relaxation; LA mildly dilated; aortic valve mildly sclerotic, no aortic stenosis  . Cardiovascular stress test  526/2011    R/Dipyridamole - EF 71%; normal myocaridal perfusion w/o evidence of ischemia or infarct; no schintigraphic evidence of inducible myocaridal ischemia; global LV function normal; EKG negative for ischemia; low risk scan   . Cardioversion N/A 11/12/2012    Procedure: CARDIOVERSION;  Surgeon: Troy Sine, MD;  Location: [facility] ENDOSCOPY;  Service: Cardiovascular;  Laterality: N/A;  . Breast lumpectomy  1998    left side   Social History:  Social History   Social History Narrative   Widowed.  Lives alone.  Does not have any home health services (hasn't wanted anyone in the house).  Ambulates with a cane or walker.    Allergies  Allergen Reactions  . Influenza Vaccines Other (See Comments)    Pt's arm swelled and dr told her not to take vaccine again.  Preston Fleeting [Trandolapril-Verapamil Hcl Er] Other (See Comments)    Increase blood pressure  . Adhesive [Tape] Rash    Adhesive bandaids   . Azithromycin Rash  . Lipitor [Atorvastatin] Rash  . Penicillins Rash    Family History  Problem Relation Age of Onset  .  Heart failure Mother   . Breast cancer Mother   . Hypertension Father   . Kidney cancer Father   . Stroke Maternal Grandmother     Prior to Admission medications   Medication Sig Start Date End Date Taking? Authorizing Provider  acetaminophen (TYLENOL) 325 MG tablet Take 650 mg by mouth 2 (two) times daily as needed for mild pain.   Yes Historical Provider, MD  acetaminophen (TYLENOL) 500 MG tablet Take 1,000 mg by mouth at bedtime.    Yes Historical Provider, MD  apixaban (ELIQUIS) 2.5 MG TABS tablet Take 1 tablet (2.5 mg total) by mouth 2 (two) times daily. 12/23/13  Yes Troy Sine, MD  atenolol (TENORMIN) 25 MG tablet Take  1 tablet (25 mg total) by mouth at bedtime. 07/01/14  Yes Robbie Lis, MD  calcium carbonate (OS-CAL) 600 MG TABS Take 600 mg by mouth daily.    Yes Historical Provider, MD  cetirizine (ZYRTEC) 10 MG tablet Take 10 mg by mouth daily.   Yes Historical Provider, MD  doxycycline (VIBRAMYCIN) 100 MG capsule Take 100 mg by mouth every 12 (twelve) hours. 03/27/15  Yes Historical Provider, MD  feeding supplement, ENSURE ENLIVE, (ENSURE ENLIVE) LIQD Take 237 mLs by mouth 2 (two) times daily between meals. 07/01/14  Yes Robbie Lis, MD  fluticasone [facility]) 50 MCG/ACT nasal spray Place 1 spray into both nostrils at bedtime.  07/04/12  Yes Historical Provider, MD  furosemide (LASIX) 20 MG tablet Take 1 tablet (20 mg total) by mouth daily. 08/15/14  Yes Robbie Lis, MD  loperamide (IMODIUM) 2 MG capsule Take 2 mg by mouth 3 (three) times daily as needed for diarrhea or loose stools (for 14 days.).   Yes Historical Provider, MD  meclizine (ANTIVERT) 25 MG tablet Take 25 mg by mouth daily as needed for dizziness.   Yes Historical Provider, MD  metoCLOPramide (REGLAN) 10 MG tablet Take 1 tablet (10 mg total) by mouth every 8 (eight) hours as needed for nausea. 03/21/15  Yes Harvel Quale, MD  Multiple Vitamins-Iron (MULTIVITAMINS WITH IRON) TABS tablet Take 1 tablet by mouth daily.   Yes Historical Provider, MD  Multiple Vitamins-Minerals (ICAPS AREDS 2 PO) Take by mouth daily.   Yes Historical Provider, MD  naphazoline (NAPHCON) 0.1 % ophthalmic solution Place 2 drops into both eyes 2 (two) times daily as needed for irritation.   Yes Historical Provider, MD  ondansetron (ZOFRAN) 4 MG tablet Take 1 tablet (4 mg total) by mouth every 6 (six) hours as needed for nausea. 08/15/14  Yes Robbie Lis, MD  rosuvastatin (CRESTOR) 10 MG tablet Take 1 tablet (10 mg total) by mouth every other day. 05/10/14  Yes Troy Sine, MD  spironolactone (ALDACTONE) 25 MG tablet Take 1 tablet by mouth daily. 02/17/15  Yes  Historical Provider, MD  valsartan (DIOVAN) 40 MG tablet Take 1 tablet (40 mg total) by mouth daily. 02/15/14  Yes Troy Sine, MD  VENTOLIN HFA 108 (90 Base) MCG/ACT inhaler Inhale 1 puff into the lungs every 6 (six) hours as needed. Wheezing. 03/27/15  Yes Historical Provider, MD  verapamil (VERELAN PM) 240 MG 24 hr capsule Take 1 capsule (240 mg total) by mouth at bedtime. 05/03/14  Yes Troy Sine, MD   Physical Exam: Filed Vitals:   04/01/15 1545 04/01/15 1701 04/01/15 1707 04/01/15 1716  BP: 131/71 141/70  132/61  Pulse: 82 101  99  Temp:      TempSrc:  Resp: 24 20  21   SpO2: 98% 97% 69% 94%    EOMI NCAT pleasant Thyromegaly Wheezy throughout + + anterior posterior lung fields Abdomen soft nontender nondistended no rebound or guarding No lower extremity edema No JVD No bruit Neurologically intact  Labs on Admission:  Basic Metabolic Panel:  Recent Labs Lab 04/01/15 1351  NA 133*  K 4.7  CL 95*  CO2 28  GLUCOSE 130*  BUN 19  CREATININE 0.72  CALCIUM 10.7*   Liver Function Tests: No results for input(s): AST, ALT, ALKPHOS, BILITOT, PROT, ALBUMIN in the last 168 hours. No results for input(s): LIPASE, AMYLASE in the last 168 hours. No results for input(s): AMMONIA in the last 168 hours. CBC:  Recent Labs Lab 04/01/15 1351  WBC 13.9*  HGB 13.3  HCT 40.3  MCV 95.3  PLT 315   Cardiac Enzymes: No results for input(s): CKTOTAL, CKMB, CKMBINDEX, TROPONINI in the last 168 hours.  BNP (last 3 results)  Recent Labs  08/14/14 0600  BNP 373.4*    ProBNP (last 3 results) No results for input(s): PROBNP in the last 8760 hours.  CBG:  Recent Labs Lab 04/01/15 1356  GLUCAP 123*    Radiological Exams on Admission: Dg Chest 2 View  04/01/2015  CLINICAL DATA:  Cough and weakness for 2-3 weeks. EXAM: CHEST  2 VIEW COMPARISON:  03/20/2015 and 08/14/2014 FINDINGS: There is chronic cardiomegaly with tortuosity and calcification of the thoracic  aorta. Pulmonary vascularity is normal and the lungs are clear. Minimal blunting of the costophrenic angles, right more than left consistent with tiny effusions. No infiltrates. No acute osseous abnormality. IMPRESSION: Chronic cardiomegaly.  Aortic atherosclerosis.  Tiny  effusions. Electronically Signed   By: Lorriane Shire M.D.   On: 04/01/2015 14:32     Assessment/Plan  acute hypoxic respiratory failure  Likely decompensated COPD acute exacerbation of Unclear for need antibiotics beyond today- Start on Solu-Medrol 80 mg every 8  nebulizer scheduled every 4 hourly Check Pro calcitonin rule out infection Check BNP to ensure no concerns for heart failure Hold Zyrtec 10 mg daily  Monitor on telemetry floor  unlikely heart failc continue Lasix and Aldactone 20 mg/25  see above discussion   Permanent atrial fibrillation, [place] score above4 Continue verapamil 240 daily at bedtime, atenolol 25 daily at bedtime Continued Elliquis 2.5 twice a day  Hyperlipidemia hold Crestor for now  Hypertension Hold Diovan 40 for now but restart if blood pressure able to tolerate  Constipation alternating with diarrhea Outside of age range for regular colonoscopies, hold both Imodium and Reglan 2 mg 3 times a day when necessary, 10 mg every 8 when necessary respectively for now  Osteoporosis hold vitamin D for now   DO NOT RESUSCITATE Admit to telemetry Expect 2-3 days for resolution

## 2015-04-01 NOTE — ED Notes (Signed)
Pt can go up at 18:57.

## 2015-04-01 NOTE — ED Notes (Signed)
Provider in room,

## 2015-04-01 NOTE — ED Notes (Signed)
Bed: [id number] Expected date:  Expected time:  Means of arrival:  Comments: EMS-83 WEAKNESS

## 2015-04-01 NOTE — ED Notes (Signed)
Pt became dizzy while ambulating and hard trouble catching her breath. Pt ambulated without assistance and without becoming winded before breathing treatments.

## 2015-04-01 NOTE — Progress Notes (Signed)
ANTIBIOTIC CONSULT NOTE - INITIAL  Pharmacy Consult for Cefepime Indication: pneumonia  Allergies  Allergen Reactions  . Influenza Vaccines Other (See Comments)    Pt's arm swelled and dr told her not to take vaccine again.  Preston Fleeting [Trandolapril-Verapamil Hcl Er] Other (See Comments)    Increase blood pressure  . Adhesive [Tape] Rash    Adhesive bandaids   . Azithromycin Rash  . Lipitor [Atorvastatin] Rash  . Penicillins Rash   Patient Measurements:   Total body weight: 83kg  Vital Signs: Temp: 97.6 F (36.4 C) (12/30 1338) Temp Source: Oral (12/30 1338) BP: 132/61 mmHg (12/30 1716) Pulse Rate: 99 (12/30 1716) Intake/Output from previous day:   Intake/Output from this shift:    Labs:  Recent Labs  04/01/15 1351  WBC 13.9*  HGB 13.3  PLT 315  CREATININE 0.72   CrCl cannot be calculated (Unknown ideal weight.). No results for input(s): VANCOTROUGH, VANCOPEAK, VANCORANDOM, GENTTROUGH, GENTPEAK, GENTRANDOM, TOBRATROUGH, TOBRAPEAK, TOBRARND, AMIKACINPEAK, AMIKACINTROU, AMIKACIN in the last 72 hours.   Microbiology: Recent Results (from the past 720 hour(s))  C difficile quick scan w PCR reflex     Status: None   Collection Time: 03/20/15  9:03 PM  Result Value Ref Range Status   C Diff antigen NEGATIVE NEGATIVE Final   C Diff toxin NEGATIVE NEGATIVE Final   C Diff interpretation Negative for toxigenic C. difficile  Final   Medical History: Past Medical History  Diagnosis Date  . Renal hypertension 03/07/2005    no evidence of significant diameter reduction, dissection, tortuosity, FMD or orther vascular abnormality; kidneys equal in size, symmetry, with normal cortex and medulla  . Palpitations   . Mixed hyperlipidemia   . Anxiety   . Atrial fibrillation (HCC)     On chronic Eliquis  . Breast cancer ([place]) dx'd 1997    left; lt lumpectomy and XRT  . Pre-diabetes    Medications:  Scheduled:  . apixaban  2.5 mg Oral BID  . atenolol  25 mg Oral QHS  .  doxycycline  100 mg Oral Q12H  . [START ON 04/02/2015] furosemide  20 mg Oral Daily  . [START ON 04/02/2015] ipratropium-albuterol  3 mL Nebulization QID  . methylPREDNISolone (SOLU-MEDROL) injection  80 mg Intravenous 3 times per day  . sodium chloride  3 mL Intravenous Q12H  . [START ON 04/02/2015] spironolactone  25 mg Oral Daily  . [START ON 04/02/2015] verapamil  240 mg Oral QHS   Anti-infectives    Start     Dose/Rate Route Frequency Ordered Stop   04/01/15 2200  doxycycline (VIBRA-TABS) tablet 100 mg     100 mg Oral Every 12 hours 04/01/15 1930     04/01/15 1815  ceFEPIme (MAXIPIME) 2 g in dextrose 5 % 50 mL IVPB     2 g 100 mL/hr over 30 Minutes Intravenous  Once 04/01/15 1803 04/01/15 1904   04/01/15 1745  vancomycin (VANCOCIN) IVPB 1000 mg/200 mL premix  Status:  Discontinued     1,000 mg 200 mL/hr over 60 Minutes Intravenous  Once 04/01/15 1735 04/01/15 1804     Assessment: 65 yoF with recent admit for N/V/D discharged to ALF. Developed cough/fever/chills at ALF, treated with 9 days Levaquin.  Received one dose Vancomycin and Cefepime in ED, Cefepime was ordered for Pharmacy dosing, but started on Doxycycline, Cefepime discontinued.  Plan:  Cefepime 2gm x1 dose, not continued  Nahome Bublitz L 04/01/2015,5:58 PM

## 2015-04-01 NOTE — ED Notes (Addendum)
Per EMS pt from [facility] pt complaint of continued cough and weakness worsening over past two weeks; currently being treated with antibiotics.   Given 125 Solu Medrol and 5 mg of albuterol en route.

## 2015-04-01 NOTE — ED Provider Notes (Signed)
CSN: [id number]     Arrival date & time 04/01/15  1327 History   First MD Initiated Contact with Patient 04/01/15 1500     Chief Complaint  Patient presents with  . Weakness     (Consider location/radiation/quality/duration/timing/severity/associated sxs/prior Treatment) HPI 81 year old female who presents with weakness. History of HTN, HLD, and atrial fibrillation on Eliquis. Diastolic heart failure with preserved EF. 2 weeks cough 1 week ago antibiotics, possible levaquin, 9 days now, initially feeling well 2 days worse, coughing fit again, feel weak, lightheaded, decreased appetite Still coughing, no sputum, weakness and sob with exertion No chest pain, leg edema or pain, syncope, fever, chills. No N/V/D or abdominal pain, dysuria, or frequency.  Called EMS Solumedrol and albuterol en route by EMS  Past Medical History  Diagnosis Date  . Renal hypertension 03/07/2005    no evidence of significant diameter reduction, dissection, tortuosity, FMD or orther vascular abnormality; kidneys equal in size, symmetry, with normal cortex and medulla  . Palpitations   . Mixed hyperlipidemia   . Anxiety   . Atrial fibrillation (HCC)     On chronic Eliquis  . Breast cancer ([place]) dx'd 1997    left; lt lumpectomy and XRT  . Pre-diabetes    Past Surgical History  Procedure Laterality Date  . Breast lumpectomy    . Tonsillectomy    . Cesarean section      x 3  . Doppler echocardiography  08/29/2011    EF [id number];  LV systolic fcn normal; doppler flow pattern suggestive of impaired LV relaxation; LA mildly dilated; aortic valve mildly sclerotic, no aortic stenosis  . Cardiovascular stress test  526/2011    R/Dipyridamole - EF 71%; normal myocaridal perfusion w/o evidence of ischemia or infarct; no schintigraphic evidence of inducible myocaridal ischemia; global LV function normal; EKG negative for ischemia; low risk scan   . Cardioversion N/A 11/12/2012    Procedure: CARDIOVERSION;  Surgeon:  Troy Sine, MD;  Location: [facility] ENDOSCOPY;  Service: Cardiovascular;  Laterality: N/A;  . Breast lumpectomy  1998    left side   Family History  Problem Relation Age of Onset  . Heart failure Mother   . Breast cancer Mother   . Hypertension Father   . Kidney cancer Father   . Stroke Maternal Grandmother    Social History  Substance Use Topics  . Smoking status: Former Smoker -- 0.75 packs/day for 10 years    Types: Cigarettes    Quit date: 04/02/1988  . Smokeless tobacco: Never Used  . Alcohol Use: 0.0 oz/week    0 Standard drinks or equivalent per week     Comment: 1/4 glass of wine every 2 weeks   OB History    No data available     Review of Systems 10/14 systems reviewed and are negative other than those stated in the HPI    Allergies  Influenza vaccines; [place]; Adhesive; Azithromycin; Lipitor; and Penicillins  Home Medications   Prior to Admission medications   Medication Sig Start Date End Date Taking? Authorizing Provider  acetaminophen (TYLENOL) 325 MG tablet Take 650 mg by mouth 2 (two) times daily as needed for mild pain.   Yes Historical Provider, MD  acetaminophen (TYLENOL) 500 MG tablet Take 1,000 mg by mouth at bedtime.    Yes Historical Provider, MD  apixaban (ELIQUIS) 2.5 MG TABS tablet Take 1 tablet (2.5 mg total) by mouth 2 (two) times daily. 12/23/13  Yes Troy Sine, MD  atenolol (  TENORMIN) 25 MG tablet Take 1 tablet (25 mg total) by mouth at bedtime. 07/01/14  Yes Robbie Lis, MD  calcium carbonate (OS-CAL) 600 MG TABS Take 600 mg by mouth daily.    Yes Historical Provider, MD  cetirizine (ZYRTEC) 10 MG tablet Take 10 mg by mouth daily.   Yes Historical Provider, MD  doxycycline (VIBRAMYCIN) 100 MG capsule Take 100 mg by mouth every 12 (twelve) hours. 03/27/15  Yes Historical Provider, MD  feeding supplement, ENSURE ENLIVE, (ENSURE ENLIVE) LIQD Take 237 mLs by mouth 2 (two) times daily between meals. 07/01/14  Yes Robbie Lis, MD  fluticasone  [facility]) 50 MCG/ACT nasal spray Place 1 spray into both nostrils at bedtime.  07/04/12  Yes Historical Provider, MD  furosemide (LASIX) 20 MG tablet Take 1 tablet (20 mg total) by mouth daily. 08/15/14  Yes Robbie Lis, MD  loperamide (IMODIUM) 2 MG capsule Take 2 mg by mouth 3 (three) times daily as needed for diarrhea or loose stools (for 14 days.).   Yes Historical Provider, MD  meclizine (ANTIVERT) 25 MG tablet Take 25 mg by mouth daily as needed for dizziness.   Yes Historical Provider, MD  metoCLOPramide (REGLAN) 10 MG tablet Take 1 tablet (10 mg total) by mouth every 8 (eight) hours as needed for nausea. 03/21/15  Yes Harvel Quale, MD  Multiple Vitamins-Iron (MULTIVITAMINS WITH IRON) TABS tablet Take 1 tablet by mouth daily.   Yes Historical Provider, MD  Multiple Vitamins-Minerals (ICAPS AREDS 2 PO) Take by mouth daily.   Yes Historical Provider, MD  naphazoline (NAPHCON) 0.1 % ophthalmic solution Place 2 drops into both eyes 2 (two) times daily as needed for irritation.   Yes Historical Provider, MD  ondansetron (ZOFRAN) 4 MG tablet Take 1 tablet (4 mg total) by mouth every 6 (six) hours as needed for nausea. 08/15/14  Yes Robbie Lis, MD  rosuvastatin (CRESTOR) 10 MG tablet Take 1 tablet (10 mg total) by mouth every other day. 05/10/14  Yes Troy Sine, MD  spironolactone (ALDACTONE) 25 MG tablet Take 1 tablet by mouth daily. 02/17/15  Yes Historical Provider, MD  valsartan (DIOVAN) 40 MG tablet Take 1 tablet (40 mg total) by mouth daily. 02/15/14  Yes Troy Sine, MD  VENTOLIN HFA 108 (90 Base) MCG/ACT inhaler Inhale 1 puff into the lungs every 6 (six) hours as needed. Wheezing. 03/27/15  Yes Historical Provider, MD  verapamil (VERELAN PM) 240 MG 24 hr capsule Take 1 capsule (240 mg total) by mouth at bedtime. 05/03/14  Yes Troy Sine, MD   BP 132/61 mmHg  Pulse 99  Temp(Src) 97.6 F (36.4 C) (Oral)  Resp 21  SpO2 94% Physical Exam Physical Exam  Nursing note and vitals  reviewed. Constitutional: Well developed, well nourished, non-toxic, and in no acute distress Head: Normocephalic and atraumatic.  Mouth/Throat: Oropharynx is clear and moist.  Neck: Normal range of motion. Neck supple.  Cardiovascular: Normal rate and regular rhythm.   Pulmonary/Chest: Effort normal. No conversational dyspnea. rhonchorous breath sounds with expiratory wheezing Abdominal: Soft. There is no tenderness. There is no rebound and no guarding.  Musculoskeletal: Normal range of motion.  Neurological: Alert, no facial droop, fluent speech, moves all extremities symmetrically Skin: Skin is warm and dry.  Psychiatric: Cooperative   ED Course  Procedures (including critical care time) Labs Review Labs Reviewed  BASIC METABOLIC PANEL - Abnormal; Notable for the following:    Sodium 133 (*)    Chloride  95 (*)    Glucose, Bld 130 (*)    Calcium 10.7 (*)    All other components within normal limits  CBC - Abnormal; Notable for the following:    WBC 13.9 (*)    All other components within normal limits  CBG MONITORING, ED - Abnormal; Notable for the following:    Glucose-Capillary 123 (*)    All other components within normal limits  URINALYSIS, ROUTINE W REFLEX MICROSCOPIC (NOT AT [facility])    Imaging Review Dg Chest 2 View  04/01/2015  CLINICAL DATA:  Cough and weakness for 2-3 weeks. EXAM: CHEST  2 VIEW COMPARISON:  03/20/2015 and 08/14/2014 FINDINGS: There is chronic cardiomegaly with tortuosity and calcification of the thoracic aorta. Pulmonary vascularity is normal and the lungs are clear. Minimal blunting of the costophrenic angles, right more than left consistent with tiny effusions. No infiltrates. No acute osseous abnormality. IMPRESSION: Chronic cardiomegaly.  Aortic atherosclerosis.  Tiny  effusions. Electronically Signed   By: Lorriane Shire M.D.   On: 04/01/2015 14:32   I have personally reviewed and evaluated these images and lab results as part of my medical  decision-making.   EKG Interpretation   Date/Time:  Friday April 01 2015 13:44:02 EST Ventricular Rate:  76 PR Interval:    QRS Duration: 93 QT Interval:  382 QTC Calculation: 429 R Axis:   105 Text Interpretation:  Atrial fibrillation Right axis deviation Minimal ST  elevation, anterior leads No significant change since last tracing  Confirmed by LIU MD, DANA [id number]) on 04/01/2015 3:03:44 PM      MDM   Final diagnoses:  HCAP (healthcare-associated pneumonia)  Hypoxia    81 year old female who presents with cough and sob despite outpatient treatment of PNA with levaquin. Afebrile and hemodynamically stable. No hypoxia or increased work of breathing at rest. With expiratory wheezes and rhonchorous breath sounds on exam. Received steroids by ems and given 2 additional breathing treatments. Improved air movement but rhonchi diffusely. Clinically with PNA, although CXR without infiltrate or other acute processes. Mild WBC elevation to 13.6. Ambulated to bathroom with desaturation to 60%. Admitted to hospitalist service for failure of outpatient treatment of PNA. Started on Vancomycin and Zosyn for HCAP (from ALF).    Forde Dandy, MD 04/01/15 [phone]

## 2015-04-02 LAB — CBC WITH DIFFERENTIAL/PLATELET
BASOS PCT: 0 %
Basophils Absolute: 0 10*3/uL (ref 0.0–0.1)
EOS ABS: 0 10*3/uL (ref 0.0–0.7)
EOS PCT: 0 %
HCT: 37.8 % (ref 36.0–46.0)
HEMOGLOBIN: 12.9 g/dL (ref 12.0–15.0)
LYMPHS ABS: 1 10*3/uL (ref 0.7–4.0)
Lymphocytes Relative: 5 %
MCH: 31.6 pg (ref 26.0–34.0)
MCHC: 34.1 g/dL (ref 30.0–36.0)
MCV: 92.6 fL (ref 78.0–100.0)
Monocytes Absolute: 0.2 10*3/uL (ref 0.1–1.0)
Monocytes Relative: 1 %
NEUTROS PCT: 94 %
Neutro Abs: 19.1 10*3/uL — ABNORMAL HIGH (ref 1.7–7.7)
PLATELETS: 308 10*3/uL (ref 150–400)
RBC: 4.08 MIL/uL (ref 3.87–5.11)
RDW: 13.8 % (ref 11.5–15.5)
WBC: 20.4 10*3/uL — AB (ref 4.0–10.5)

## 2015-04-02 LAB — BASIC METABOLIC PANEL
Anion gap: 9 (ref 5–15)
BUN: 20 mg/dL (ref 6–20)
CALCIUM: 10.3 mg/dL (ref 8.9–10.3)
CHLORIDE: 96 mmol/L — AB (ref 101–111)
CO2: 26 mmol/L (ref 22–32)
CREATININE: 0.74 mg/dL (ref 0.44–1.00)
GFR calc non Af Amer: >60 mL/min (ref >60)
Glucose, Bld: 169 mg/dL — ABNORMAL HIGH (ref 65–99)
Potassium: 4.4 mmol/L (ref 3.5–5.1)
SODIUM: 131 mmol/L — AB (ref 135–145)

## 2015-04-02 MED ORDER — ACETAMINOPHEN 500 MG PO TABS
500.0000 mg | ORAL_TABLET | Freq: Three times a day (TID) | ORAL | Status: DC | PRN
  Administered 2015-04-03: 500 mg via ORAL
  Filled 2015-04-02 (×2): qty 1

## 2015-04-02 MED ORDER — NAPHAZOLINE-PHENIRAMINE 0.025-0.3 % OP SOLN
2.0000 [drp] | Freq: Two times a day (BID) | OPHTHALMIC | Status: DC | PRN
  Filled 2015-04-02: qty 5

## 2015-04-02 MED ORDER — IRBESARTAN 75 MG PO TABS
75.0000 mg | ORAL_TABLET | Freq: Every day | ORAL | Status: DC
  Administered 2015-04-02 – 2015-04-03 (×2): 75 mg via ORAL
  Filled 2015-04-02 (×2): qty 1

## 2015-04-02 NOTE — Evaluation (Signed)
Occupational Therapy Evaluation Patient Details Name: Alexandra Luna MRN: [id number] DOB: March 10, 1927 Today's Date: 04/02/2015    History of Present Illness  81 y.o. Resident of [facility] admitted with cough, SOB.  Dx acute COPD exacerbation.  PMH includes:  A_Fib, HTN, dementia    Clinical Impression   Pt admitted with above. She demonstrates the below listed deficits and will benefit from continued OT to maximize safety and independence with BADLs.  Pt fatigues quickly with minimal activity - DOE 4/4 with grooming standing at sink and subsequent LOB requiring min A to prevent fall.   Unable to obtain pulse ox reading at that time as probe would not register a reading.  After ~4 min seated rest break, 02 sat registered 98%.   Overall, she requires min A for ADLs.  She lives at [place] where she was mod I with ADLs.  IF ALF able to meet pt's current level of care (min A), then recommend return to ALF with [place].  IF they can't provide necessary level of care, recommend SNF.       Follow Up Recommendations  Home health OT;Supervision/Assistance - 24 hour (IF ALF able to provide min A )    Equipment Recommendations  None recommended by OT    Recommendations for Other Services       Precautions / Restrictions Precautions Precautions: Fall      Mobility Bed Mobility Overal bed mobility: Needs Assistance Bed Mobility: Supine to Sit     Supine to sit: Min guard        Transfers Overall transfer level: Needs assistance Equipment used: 1 person hand held assist Transfers: Sit to/from [facility] Sit to Stand: Min guard Stand pivot transfers: Min guard;Min assist            Balance                                            ADL Overall ADL's : Needs assistance/impaired Eating/Feeding: Independent;Sitting   Grooming: Wash/dry hands;Wash/dry face;Oral care;Minimal assistance;Standing Grooming Details (indicate cue  type and reason): Pt stood to brush teeth with min guard assist, but quickly fatigued with DOE 4/4 and LOB requiring min A to recover  Upper Body Bathing: Set up;Supervision/ safety;Sitting   Lower Body Bathing: Minimal assistance;Sit to/from stand   Upper Body Dressing : Set up;Sitting   Lower Body Dressing: Sit to/from stand;Minimal assistance   Toilet Transfer: Min guard;Ambulation;Comfort height toilet;Grab bars   Toileting- Clothing Manipulation and Hygiene: Min guard;Sit to/from stand       Functional mobility during ADLs: Herbalist     Praxis      Pertinent Vitals/Pain Pain Assessment: No/denies pain     Hand Dominance Right   Extremity/Trunk Assessment Upper Extremity Assessment Upper Extremity Assessment: Overall WFL for tasks assessed   Lower Extremity Assessment Lower Extremity Assessment: Defer to PT evaluation   Cervical / Trunk Assessment Cervical / Trunk Assessment: Normal   Communication Communication Communication: No difficulties   Cognition Arousal/Alertness: Awake/alert Behavior During Therapy: WFL for tasks assessed/performed Overall Cognitive Status: History of cognitive impairments - at baseline                     General Comments       Exercises  Shoulder Instructions      Home Living Family/patient expects to be discharged to:: Assisted living                             Home Equipment: Gilford Rile - 2 wheels   Additional Comments: Pt lives at [facility]       Prior Functioning/Environment Level of Independence: Needs assistance  Gait / Transfers Assistance Needed: mod I with RW per pt ADL's / Homemaking Assistance Needed: Mod I per pt.   Comments: Pt reports she requires assist with medication management     OT Diagnosis: Generalized weakness;Cognitive deficits   OT Problem List: Decreased strength;Decreased activity tolerance;Impaired balance  (sitting and/or standing);Decreased cognition;Decreased safety awareness;Cardiopulmonary status limiting activity   OT Treatment/Interventions: Self-care/ADL training;Therapeutic exercise;DME and/or AE instruction;Therapeutic activities;Cognitive remediation/compensation;Patient/family education;Balance training    OT Goals(Current goals can be found in the care plan section) Acute Rehab OT Goals Patient Stated Goal: to get stronger  OT Goal Formulation: With patient Time For Goal Achievement: 04/16/15 Potential to Achieve Goals: Good ADL Goals Pt Will Perform Grooming: with supervision;standing Pt Will Perform Lower Body Bathing: with supervision;sit to/from stand Pt Will Perform Lower Body Dressing: with supervision;sit to/from stand Pt Will Transfer to Toilet: with supervision;ambulating;regular height toilet;bedside commode;grab bars Pt Will Perform Toileting - Clothing Manipulation and hygiene: with supervision;sit to/from stand  OT Frequency: Min 2X/week   Barriers to D/C:            Co-evaluation              End of Session Nurse Communication: Mobility status  Activity Tolerance: Patient limited by fatigue Patient left: in chair;with call bell/phone within reach;with chair alarm set   Time: [id number] OT Time Calculation (min): 30 min Charges:  OT General Charges $OT Visit: 1 Procedure OT Evaluation $Initial OT Evaluation Tier I: 1 Procedure OT Treatments $Self Care/Home Management : 8-22 mins G-Codes:    Leidi Astle M 04-04-2015, 5:06 PM

## 2015-04-02 NOTE — Progress Notes (Addendum)
Nutrition Brief Note  RD consulted via COPD Gold Protocol.  Pt's weight is stable. Pt reports eating small portions frequently throughout the day.  Wt Readings from Last 15 Encounters:  04/01/15 183 lb (83.008 kg)  03/03/15 183 lb (83.008 kg)  08/26/14 174 lb (78.926 kg)  08/15/14 172 lb 3.2 oz (78.109 kg)  06/28/14 172 lb 3.2 oz (78.109 kg)  06/27/14 165 lb (74.844 kg)  03/11/14 165 lb 11.2 oz (75.161 kg)  09/30/13 165 lb 11.2 oz (75.161 kg)  05/11/13 171 lb 9.6 oz (77.837 kg)  02/09/13 174 lb 4.8 oz (79.062 kg)  12/08/12 175 lb 4.8 oz (79.516 kg)  11/04/12 176 lb 8 oz (80.06 kg)  10/13/12 177 lb 11.2 oz (80.604 kg)  09/22/12 175 lb 6.4 oz (79.561 kg)  04/28/12 184 lb 14.4 oz (83.87 kg)    Body mass index is 29.55 kg/(m^2). Patient meets criteria for overweight based on current BMI.   Current diet order is regular. Labs and medications reviewed.   No nutrition interventions warranted at this time. If nutrition issues arise, please consult RD.   Clayton Bibles, MS, RD, LDN Pager: [phone] After Hours Pager: [phone]

## 2015-04-02 NOTE — Progress Notes (Signed)
Alexandra Luna [id number] DOB: 1927/03/14 DOA: 04/01/2015 PCP: Reymundo Poll, MD  Brief narrative: 73 ? from ALF Permanent Afib, CHad2Vasc2~4 Pulm nodules Stg 1 L Breast Ca-prior on tamoxifene, Femara Htn HLd Hypok TCP ? Mild dementia  Admitted with SOB and recent Rx for Bronchitis-finished recently 9 days levaquin desatted to 60 % while in ED   New oxyen requirmnent  Wbc 13Sodium 133 BUN/creatinine 19/ 0.7 glucose 130 Urinalysis negative Chest x-ray = chronic cardiomegaly and tortuosity with calcification of aorta no acute infiltrate EKG rate controlled A. fib no ST-T wave elevations suggestive of any ischemia   Past medical history-As per Problem list Chart reviewed as below-   Consultants:    Procedures:    Antibiotics:     Subjective  \ Doing fair antsy to get up again Thinks she is ready to walk around now No cp/n/v/sob/no diarr Felt a little better with nebs   Objective    Interim History:   Telemetry: pvc's   Objective: Filed Vitals:   04/01/15 1927 04/01/15 2041 04/02/15 0543 04/02/15 0726  BP: 129/67  149/79   Pulse: 100  93 87  Temp: 97.9 F (36.6 C)  98 F (36.7 C)   TempSrc: Oral  Oral   Resp: 18  16 16   Height: 5\' 6"  (1.676 m)     Weight:      SpO2: 100% 98% 96% 97%    Intake/Output Summary (Last 24 hours) at 04/02/15 0735 Last data filed at 04/02/15 0000  Gross per 24 hour  Intake      0 ml  Output    450 ml  Net   -450 ml    Exam:  General: eomi ncat Cardiovascular: s1 s 2no m/r/g Respiratory:  Clearer no wheeze no rubs, no rales nor rhonchi Abdomen:  Soft nt Skin no le edema  Data Reviewed: Basic Metabolic Panel:  Recent Labs Lab 04/01/15 1351  NA 133*  K 4.7  CL 95*  CO2 28  GLUCOSE 130*  BUN 19  CREATININE 0.72  CALCIUM 10.7*   Liver Function Tests: No results for input(s): AST, ALT, ALKPHOS, BILITOT, PROT, ALBUMIN in the last 168 hours. No results for input(s): LIPASE, AMYLASE in the  last 168 hours. No results for input(s): AMMONIA in the last 168 hours. CBC:  Recent Labs Lab 04/01/15 1351  WBC 13.9*  HGB 13.3  HCT 40.3  MCV 95.3  PLT 315   Cardiac Enzymes: No results for input(s): CKTOTAL, CKMB, CKMBINDEX, TROPONINI in the last 168 hours. BNP: Invalid input(s): POCBNP CBG:  Recent Labs Lab 04/01/15 1356  GLUCAP 123*    Recent Results (from the past 240 hour(s))  MRSA PCR Screening     Status: None   Collection Time: 04/01/15  8:33 PM  Result Value Ref Range Status   MRSA by PCR NEGATIVE NEGATIVE Final    Comment:        The GeneXpert MRSA Assay (FDA approved for NASAL specimens only), is one component of a comprehensive MRSA colonization surveillance program. It is not intended to diagnose MRSA infection nor to guide or monitor treatment for MRSA infections.      Studies:              All Imaging reviewed and is as per above notation   Scheduled Meds: . apixaban  2.5 mg Oral BID  . atenolol  25 mg Oral QHS  . doxycycline  100 mg Oral Q12H  . furosemide  20 mg Oral  Daily  . ipratropium-albuterol  3 mL Nebulization QID  . methylPREDNISolone (SOLU-MEDROL) injection  80 mg Intravenous 3 times per day  . sodium chloride  3 mL Intravenous Q12H  . spironolactone  25 mg Oral Daily  . verapamil  240 mg Oral QHS   Continuous Infusions:    Assessment/Plan:  acute hypoxic respiratory failure  Likely decompensated COPD acute exacerbation> AECHF Given 2 doses IV abd then stopped Continue Doxycycline 00 bid as a burst Continue Solu-Medrol 80 mg every 8  nebulizer scheduled every 4 hourly Await Pro calcitonin rule out infection  BNP 268 and is non-sepcfic-she got better on steoirds.  Would not Rx for HF at this stage Hold Zyrtec 10 mg daily  Monitor on telemetry floor  unlikely heart failure continue Lasix and Aldactone 20 mg/25  see above discussion   Permanent atrial fibrillation, [place] score above4 Continue verapamil 240 daily  at bedtime, atenolol 25 daily at bedtime Continued Elliquis 2.5 twice a day D/c tele 04/02/15  Hyperlipidemia hold Crestor for now  Leukocytosis is 2/2 to high dose steroids  Hypertension Hold Diovan 40 on admit started Valsartan 75 12/31 for  Uncontrolled bp  Constipation alternating with diarrhea Outside of age range for regular colonoscopies, hold both Imodium and Reglan 2 mg 3 times a day when necessary, 10 mg every 8 when necessary respectively for now  Osteoporosis hold vitamin D for now    Appt with PCP: patient to scheudle Code Status:  DNR Family Communication: Called Vernie Murders family + updated Disposition Plan: To ALf when ready in ~ 48 hrs DVT prophylaxis: SCD Consultants: none  Verneita Griffes, MD  [facility] Pager [phone] 04/02/2015, 7:31 AM    LOS: 1 day

## 2015-04-03 LAB — CBC WITH DIFFERENTIAL/PLATELET
BASOS ABS: 0 10*3/uL (ref 0.0–0.1)
Basophils Relative: 0 %
EOS ABS: 0 10*3/uL (ref 0.0–0.7)
Eosinophils Relative: 0 %
HEMATOCRIT: 37.5 % (ref 36.0–46.0)
Hemoglobin: 12.6 g/dL (ref 12.0–15.0)
LYMPHS ABS: 1 10*3/uL (ref 0.7–4.0)
Lymphocytes Relative: 3 %
MCH: 31.6 pg (ref 26.0–34.0)
MCHC: 33.6 g/dL (ref 30.0–36.0)
MCV: 94 fL (ref 78.0–100.0)
MONOS PCT: 3 %
Monocytes Absolute: 1 10*3/uL (ref 0.1–1.0)
NEUTROS ABS: 29.8 10*3/uL — AB (ref 1.7–7.7)
Neutrophils Relative %: 94 %
Platelets: 329 10*3/uL (ref 150–400)
RBC: 3.99 MIL/uL (ref 3.87–5.11)
RDW: 14 % (ref 11.5–15.5)
WBC: 31.8 10*3/uL — AB (ref 4.0–10.5)

## 2015-04-03 LAB — BASIC METABOLIC PANEL
ANION GAP: 12 (ref 5–15)
BUN: 33 mg/dL — ABNORMAL HIGH (ref 6–20)
CALCIUM: 10.6 mg/dL — AB (ref 8.9–10.3)
CO2: 26 mmol/L (ref 22–32)
CREATININE: 0.77 mg/dL (ref 0.44–1.00)
Chloride: 96 mmol/L — ABNORMAL LOW (ref 101–111)
Glucose, Bld: 165 mg/dL — ABNORMAL HIGH (ref 65–99)
Potassium: 4.7 mmol/L (ref 3.5–5.1)
SODIUM: 134 mmol/L — AB (ref 135–145)

## 2015-04-03 MED ORDER — AEROCHAMBER PLUS FLO-VU MEDIUM MISC: 1.0000 | Freq: Once | Status: AC

## 2015-04-03 MED ORDER — ALBUTEROL SULFATE HFA 108 (90 BASE) MCG/ACT IN AERS: 1.0000 | INHALATION_SPRAY | RESPIRATORY_TRACT | Status: DC | PRN

## 2015-04-03 MED ORDER — AEROCHAMBER PLUS FLO-VU MEDIUM MISC
1.0000 | Freq: Once | Status: DC
  Filled 2015-04-03: qty 1

## 2015-04-03 MED ORDER — PREDNISONE 20 MG PO TABS: 60.0000 mg | ORAL_TABLET | Freq: Every day | ORAL | Status: DC

## 2015-04-03 MED ORDER — PREDNISONE 50 MG PO TABS
60.0000 mg | ORAL_TABLET | Freq: Every day | ORAL | Status: DC
  Administered 2015-04-03: 60 mg via ORAL
  Filled 2015-04-03: qty 1

## 2015-04-03 MED ORDER — IPRATROPIUM-ALBUTEROL 0.5-2.5 (3) MG/3ML IN SOLN: 3.0000 mL | Freq: Three times a day (TID) | RESPIRATORY_TRACT | Status: DC

## 2015-04-03 MED ORDER — ALBUTEROL SULFATE HFA 108 (90 BASE) MCG/ACT IN AERS
1.0000 | INHALATION_SPRAY | RESPIRATORY_TRACT | Status: DC | PRN
  Filled 2015-04-03: qty 6.7

## 2015-04-03 NOTE — Clinical Social Work Note (Signed)
CSW called and spoke with Rip Harbour at [facility] to coordinate pt discharge back to her ALF  CSW met with pt at bedside to discuss transport and pt wants to be transported via ambulance  CSW prepared packet provided it to RN and called for pt transport  .Dede Query, LCSW [facility] Clinical Social Worker - Weekend Coverage cell #: [phone]

## 2015-04-03 NOTE — Evaluation (Signed)
Physical Therapy Evaluation Patient Details Name: Alexandra Luna MRN: [id number] DOB: 1927-01-04 Today's Date: 04/03/2015   History of Present Illness  82 y.o. Resident of [facility] admitted with cough, SOB.  Dx acute COPD exacerbation.  PMH includes:  A_Fib, HTN, dementia   Clinical Impression  Patient evaluated by Physical Therapy with no further acute PT needs identified. All education has been completed and the patient has no further questions.   See below for any follow-up Physical Therapy or equipment needs. PT is signing off. Thank you for this referral. Pt reports today she is back to her baseline, likely no f/u PT needed; planning to return to ALF     Follow Up Recommendations No PT follow up    Equipment Recommendations  None recommended by PT    Recommendations for Other Services       Precautions / Restrictions Precautions Precautions: Fall      Mobility  Bed Mobility               General bed mobility comments: NT --in chair  Transfers Overall transfer level: Needs assistance   Transfers: Sit to/from Stand Sit to Stand: Min guard;Supervision         General transfer comment: initial cues for hand placement  Ambulation/Gait Ambulation/Gait assistance: Min guard;Supervision Ambulation Distance (Feet): 120 Feet (10' without walker) Assistive device: Rolling walker (2 wheeled) Gait Pattern/deviations: Step-through pattern;Trunk flexed     General Gait Details: initial cues for staying within frame of walker, however pt ambulates with rollator at home  Stairs            Wheelchair Mobility    Modified Rankin (Stroke Patients Only)       Balance Overall balance assessment: Needs assistance         Standing balance support: No upper extremity supported Standing balance-Leahy Scale: Fair Standing balance comment: at least fair, not tested formally  further today although pt able to amb with close supervision short distance  without AD and no LOB             High level balance activites: Backward walking;Turns;Side stepping               Pertinent Vitals/Pain Pain Assessment: No/denies pain    Home Living Family/patient expects to be discharged to:: Assisted living               Home Equipment: Walker - 4 wheels Additional Comments: Pt lives at [facility]     Prior Function Level of Independence: Needs assistance   Gait / Transfers Assistance Needed: mod I with RW per pt  ADL's / Homemaking Assistance Needed: Mod I per pt.  Comments: Pt reports she requires assist with medication management  Pt reports she goes to dining room 3x/day in w/c     Hand Dominance        Extremity/Trunk Assessment   Upper Extremity Assessment: Overall WFL for tasks assessed (per OT eval)           Lower Extremity Assessment: Overall WFL for tasks assessed         Communication   Communication: No difficulties  Cognition Arousal/Alertness: Awake/alert Behavior During Therapy: WFL for tasks assessed/performed Overall Cognitive Status: History of cognitive impairments - at baseline (although appropriate during PT eval, follows commands)                      General Comments      Exercises  Assessment/Plan    PT Assessment Patent does not need any further PT services  PT Diagnosis Difficulty walking   PT Problem List    PT Treatment Interventions     PT Goals (Current goals can be found in the Care Plan section) Acute Rehab PT Goals Patient Stated Goal: to get stronger  PT Goal Formulation: All assessment and education complete, DC therapy    Frequency     Barriers to discharge        Co-evaluation               End of Session   Activity Tolerance: Patient tolerated treatment well Patient left: in chair;with call bell/phone within reach Nurse Communication: Mobility status         Time: [id number] PT Time Calculation (min) (ACUTE ONLY):  13 min   Charges:   PT Evaluation $Initial PT Evaluation Tier I: 1 Procedure     PT G CodesKenyon Ana 04/22/2015, 4:24 PM

## 2015-04-03 NOTE — Discharge Summary (Signed)
Physician Discharge Summary  Alexandra Luna [id number] DOB: 1927-02-16 DOA: 04/01/2015  PCP: Reymundo Poll, MD  Admit date: 04/01/2015 Discharge date: 04/03/2015  Time spent: 35 minutes  Recommendations for Outpatient Follow-up:  1. Needs inhaler use and spacer and consider OP referral to Pulmonary for PFT's 2. Complete 4 more days of doxycyline 100 bid ending 04/06/14 for COPD flare--can resume this as was Rx from ALF 3. Complete burst of PO steroids on 04/06/14 as well 4. Needs cbc + bmet 1 week at facility 5. Will require Home health at ALF with OT/PT to maximise potential 6. Consider OP MMSE for screening for dementia  Discharge Diagnoses:  Active Problems:   COPD exacerbation [facility])   Discharge Condition: improved  Diet recommendation:  hh low salt  Filed Weights   04/01/15 1905  Weight: 83.008 kg (183 lb)    History of present illness:  82 ? from ALF Permanent Afib, CHad2Vasc2~4 Pulm nodules Stg 1 L Breast Ca-prior on tamoxifene, Femara Htn HLd Hypok TCP ? Mild dementia  Admitted with SOB and recent Rx for Bronchitis-finished recently 9 days levaquin desatted to 60 % while in ED   New oxyen requirmnent  Wbc 13 Sodium 133 BUN/creatinine 19/ 0.7 glucose 130 Urinalysis negative Chest x-ray = chronic cardiomegaly and tortuosity with calcification of aorta no acute infiltrate EKG rate controlled A. fib no ST-T wave elevations suggestive of any ischemia   Hospital Course:  acute hypoxic respiratory failure  Likely decompensated COPD acute exacerbation> AECHF Given 2 doses IV antibiotics then stopped Continue Doxycycline 80 bid  until 04/07/15 Initially was on Solu-Medrol 80 mg every 8 and made a dramatic improvement and hence placed on prednisone 60 mg until 04/07/15 without taper Resume albuterol and spacer as an outpatient BNP 268 and is non-sepcfic-she got better on steoirds. Would not Rx for HF at this stage Hold Zyrtec 80 mg daily   unlikely heart  failure continue Lasix and Aldactone 20 mg/25  see above discussion   Permanent atrial fibrillation, [place] score above4 Continue verapamil 240 daily at bedtime, atenolol 25 daily at bedtime Continued Elliquis 2.5 twice a day D/c tele 04/02/15  Hyperlipidemia hold Crestor for now and resume don d/c home  Leukocytosis is 2/2 to high dose steroids Repeat in about one week CBC  Hypertension Hold Diovan 80 on admit started Valsartan 75 12/31 for Uncontrolled bp Will resume home doses Diovan when d/c  Constipation alternating with diarrhea Outside of age range for regular colonoscopies, hold both Imodium and Reglan 2 mg 3 times a day when necessary, 10 mg every 8 when necessary respectively for now  Osteoporosis hold vitamin D for now  Discharge Exam: Filed Vitals:   04/03/15 0400 04/03/15 0401  BP: 129/60   Pulse: 74   Temp: 97.6 F (36.4 C) 97.6 F (36.4 C)  Resp: 18    Alert pleasant oriented no apparent distress General: EOMI NCAT pleasant a little bit more oriented Cardiovascular: S1-S2 no murmur rub or gallop Respiratory: Clinically clear mild wheeze only  Discharge Instructions    Current Discharge Medication List    START taking these medications   Details  predniSONE (DELTASONE) 20 MG tablet Take 3 tablets (60 mg total) by mouth daily before breakfast. Qty: 15 tablet, Refills: 0      CONTINUE these medications which have NOT CHANGED   Details  acetaminophen (TYLENOL) 325 MG tablet Take 650 mg by mouth 2 (two) times daily as needed for mild pain.    apixaban (ELIQUIS)  2.5 MG TABS tablet Take 1 tablet (2.5 mg total) by mouth 2 (two) times daily. Qty: 60 tablet, Refills: 6    atenolol (TENORMIN) 25 MG tablet Take 1 tablet (25 mg total) by mouth at bedtime. Qty: 30 tablet, Refills: 0    calcium carbonate (OS-CAL) 600 MG TABS Take 600 mg by mouth daily.    Associated Diagnoses: Breast CA ([place])    cetirizine (ZYRTEC) 10 MG tablet Take 10 mg by mouth  daily.    doxycycline (VIBRAMYCIN) 100 MG capsule Take 100 mg by mouth every 12 (twelve) hours. Refills: 0    feeding supplement, ENSURE ENLIVE, (ENSURE ENLIVE) LIQD Take 237 mLs by mouth 2 (two) times daily between meals. Qty: 237 mL, Refills: 12    fluticasone (FLONASE) 50 MCG/ACT nasal spray Place 1 spray into both nostrils at bedtime.     furosemide (LASIX) 20 MG tablet Take 1 tablet (20 mg total) by mouth daily. Qty: 30 tablet, Refills: 0    loperamide (IMODIUM) 2 MG capsule Take 2 mg by mouth 3 (three) times daily as needed for diarrhea or loose stools (for 14 days.).    meclizine (ANTIVERT) 25 MG tablet Take 25 mg by mouth daily as needed for dizziness.    metoCLOPramide (REGLAN) 10 MG tablet Take 1 tablet (10 mg total) by mouth every 8 (eight) hours as needed for nausea. Qty: 10 tablet, Refills: 0    Multiple Vitamins-Iron (MULTIVITAMINS WITH IRON) TABS tablet Take 1 tablet by mouth daily.    naphazoline (NAPHCON) 0.1 % ophthalmic solution Place 2 drops into both eyes 2 (two) times daily as needed for irritation.    ondansetron (ZOFRAN) 4 MG tablet Take 1 tablet (4 mg total) by mouth every 6 (six) hours as needed for nausea. Qty: 20 tablet, Refills: 0    rosuvastatin (CRESTOR) 10 MG tablet Take 1 tablet (10 mg total) by mouth every other day. Qty: 30 tablet, Refills: 5   Associated Diagnoses: Breast CA, unspecified laterality    spironolactone (ALDACTONE) 25 MG tablet Take 1 tablet by mouth daily.    valsartan (DIOVAN) 40 MG tablet Take 1 tablet (40 mg total) by mouth daily. Qty: 30 tablet, Refills: 8   Associated Diagnoses: History of breast cancer in female    VENTOLIN HFA 108 (90 Base) MCG/ACT inhaler Inhale 1 puff into the lungs every 6 (six) hours as needed. Wheezing. Refills: 0    verapamil (VERELAN PM) 240 MG 24 hr capsule Take 1 capsule (240 mg total) by mouth at bedtime. Qty: 90 capsule, Refills: 3      STOP taking these medications     Multiple  Vitamins-Minerals (ICAPS AREDS 2 PO)        Allergies  Allergen Reactions  . Influenza Vaccines Other (See Comments)    Pt's arm swelled and dr told her not to take vaccine again.  Preston Fleeting [Trandolapril-Verapamil Hcl Er] Other (See Comments)    Increase blood pressure  . Adhesive [Tape] Rash    Adhesive bandaids   . Azithromycin Rash  . Lipitor [Atorvastatin] Rash  . Penicillins Rash      The results of significant diagnostics from this hospitalization (including imaging, microbiology, ancillary and laboratory) are listed below for reference.    Significant Diagnostic Studies: Dg Chest 2 View  04/01/2015  CLINICAL DATA:  Cough and weakness for 2-3 weeks. EXAM: CHEST  2 VIEW COMPARISON:  03/20/2015 and 08/14/2014 FINDINGS: There is chronic cardiomegaly with tortuosity and calcification of the thoracic aorta. Pulmonary  vascularity is normal and the lungs are clear. Minimal blunting of the costophrenic angles, right more than left consistent with tiny effusions. No infiltrates. No acute osseous abnormality. IMPRESSION: Chronic cardiomegaly.  Aortic atherosclerosis.  Tiny  effusions. Electronically Signed   By: Lorriane Shire M.D.   On: 04/01/2015 14:32   Ct Abdomen Pelvis W Contrast  03/20/2015  CLINICAL DATA:  82 year old from nursing home with nausea, vomiting and body aches for 2 days. EXAM: CT ABDOMEN AND PELVIS WITH CONTRAST TECHNIQUE: Multidetector CT imaging of the abdomen and pelvis was performed using the standard protocol following bolus administration of intravenous contrast. CONTRAST:  43mL OMNIPAQUE IOHEXOL 300 MG/ML SOLN, 144mL OMNIPAQUE IOHEXOL 300 MG/ML SOLN COMPARISON:  Acute abdominal series done today. Chest CT 08/14/2014. FINDINGS: Lower chest: Interval resolution of bilateral pleural effusions with improvement in the previously demonstrated pericardial effusion. The heart is enlarged. There is atherosclerosis of the aorta and coronary arteries. 8 mm left lower lobe  nodule on image number 5 was not clearly seen previously. There is a tiny right lower lobe nodule on image 1. Hepatobiliary: The liver contours are mildly irregular. There is a probable small subcapsular cyst in the right lobe on image 20. No suspicious hepatic findings are demonstrated. There is mild biliary dilatation status post cholecystectomy, within physiologic limits. Pancreas: Unremarkable. No pancreatic ductal dilatation or surrounding inflammatory changes. Spleen: Normal in size without focal abnormality. Adrenals/Urinary Tract: Both adrenal glands appear normal. The kidneys appear normal without evidence of urinary tract calculus, suspicious lesion or hydronephrosis. No bladder abnormalities are seen. Stomach/Bowel: No evidence of bowel wall thickening, distention or surrounding inflammatory change. The appendix is not clearly seen. There is no pericecal inflammation. There are small duodenal diverticula. Vascular/Lymphatic: There are no enlarged abdominal or pelvic lymph nodes. There is atherosclerosis of the aorta, its branches and the iliac arteries. Reproductive: Unremarkable.  No evidence of adnexal mass. Other: No evidence of abdominal wall mass or hernia. Musculoskeletal: No acute or significant osseous findings. There are degenerative changes throughout the spine associated with a convex right scoliosis. IMPRESSION: 1. No acute findings or explanation for the patient's symptoms. No evidence of bowel obstruction or acute inflammation. 2. Moderate atherosclerosis. 3. Interval improvement in the previously demonstrated pleural and pericardial effusions. 4. Apparent new left lower lobe nodule, measuring 8 mm in diameter. This is potentially neoplastic. If the patient is at high risk for bronchogenic carcinoma, follow-up chest CT at 3-46months is recommended. If the patient is at low risk for bronchogenic carcinoma, follow-up chest CT at 6-12 months is recommended. This recommendation follows the  consensus statement: Guidelines for Management of Small Pulmonary Nodules Detected on CT Scans: A Statement from the [place] as published in Radiology 2005; 237:395-400. Electronically Signed   By: Richardean Sale M.D.   On: 03/20/2015 20:41   Dg Abd Acute W/chest  03/20/2015  CLINICAL DATA:  Nausea with vomiting, chills and body aches for 2 days. History of breast cancer. EXAM: DG ABDOMEN ACUTE W/ 1V CHEST COMPARISON:  Chest radiographs 08/14/2014.  Chest CTA 08/14/2014. FINDINGS: There is stable mild cardiomegaly and aortic atherosclerosis. Pulmonary edema and bilateral pleural effusions demonstrated previously have resolved. The lungs are now clear. There are surgical clips in the left axilla status post left breast surgery. The abdomen is relatively gasless. There are air-fluid levels within the stomach and colon on the erect examination. No significant bowel distention, wall thickening or free intraperitoneal air identified. There are no suspicious abdominal calcifications. Degenerative changes are  present throughout the lumbar spine. IMPRESSION: 1. Cardiomegaly with resolution of previously demonstrated edema and pleural effusions. 2. Nonspecific, nonobstructive bowel gas pattern with air-fluid levels in nondistended colon and stomach. Electronically Signed   By: Richardean Sale M.D.   On: 03/20/2015 17:24    Microbiology: Recent Results (from the past 240 hour(s))  MRSA PCR Screening     Status: None   Collection Time: 04/01/15  8:33 PM  Result Value Ref Range Status   MRSA by PCR NEGATIVE NEGATIVE Final    Comment:        The GeneXpert MRSA Assay (FDA approved for NASAL specimens only), is one component of a comprehensive MRSA colonization surveillance program. It is not intended to diagnose MRSA infection nor to guide or monitor treatment for MRSA infections.      Labs: Basic Metabolic Panel:  Recent Labs Lab 04/01/15 1351 04/02/15 0750 04/03/15 0615  NA 133*  131* 134*  K 4.7 4.4 4.7  CL 95* 96* 96*  CO2 28 26 26   GLUCOSE 130* 169* 165*  BUN 19 20 33*  CREATININE 0.72 0.74 0.77  CALCIUM 10.7* 10.3 10.6*   Liver Function Tests: No results for input(s): AST, ALT, ALKPHOS, BILITOT, PROT, ALBUMIN in the last 168 hours. No results for input(s): LIPASE, AMYLASE in the last 168 hours. No results for input(s): AMMONIA in the last 168 hours. CBC:  Recent Labs Lab 04/01/15 1351 04/02/15 0750 04/03/15 0615  WBC 13.9* 20.4* 31.8*  NEUTROABS  --  19.1* 29.8*  HGB 13.3 12.9 12.6  HCT 40.3 37.8 37.5  MCV 95.3 92.6 94.0  PLT 315 308 329   Cardiac Enzymes: No results for input(s): CKTOTAL, CKMB, CKMBINDEX, TROPONINI in the last 168 hours. BNP: BNP (last 3 results)  Recent Labs  08/14/14 0600 04/01/15 2020  BNP 373.4* 265.8*    ProBNP (last 3 results) No results for input(s): PROBNP in the last 8760 hours.  CBG:  Recent Labs Lab 04/01/15 1356  GLUCAP 123*       Signed:  Nita Sells MD   [facility] 04/03/2015, 11:03 AM

## 2015-04-03 NOTE — NC FL2 (Signed)
[place] LEVEL OF CARE SCREENING TOOL     IDENTIFICATION  Patient Name: Alexandra Luna Birthdate: February 05, 1927 Sex: female Admission Date (Current Location): 04/01/2015  [facility] and [place] Number:  Herbalist and Address:  [facility],  [place] [address] [place]      Provider Number: [id number]  Attending Physician Name and Address:  Nita Sells, MD  Relative Name and Phone Number:       Current Level of Care: Hospital Recommended Level of Care: [place] Prior Approval Number:    Date Approved/Denied:   PASRR Number:    Discharge Plan:  (ALF)    Current Diagnoses: Patient Active Problem List   Diagnosis Date Noted  . COPD exacerbation ([place]) 04/01/2015  . Bilateral pleural effusion 08/30/2014  . Multiple pulmonary nodules 08/30/2014  . Acute diastolic congestive heart failure ([place]) 08/14/2014  . Acute respiratory failure with hypoxia ([place]) 08/14/2014  . Chronic anticoagulation 12/08/2012  . AF (atrial fibrillation) ([place]) 09/22/2012  . Essential hypertension 09/22/2012  . Hyperlipidemia, mixed 09/22/2012  . History of breast cancer in female 04/28/2012    Orientation RESPIRATION BLADDER Height & Weight    Self, Time, Situation, Place    Continent 5\' 6"  (167.6 cm) 183 lbs.  BEHAVIORAL SYMPTOMS/MOOD NEUROLOGICAL BOWEL NUTRITION STATUS      Continent    AMBULATORY STATUS COMMUNICATION OF NEEDS Skin   Limited Assist Verbally                         Personal Care Assistance Level of Assistance  Bathing, Dressing Bathing Assistance: Limited assistance   Dressing Assistance: Limited assistance     Functional Limitations Info  Sight Sight Info: Impaired        SPECIAL CARE FACTORS FREQUENCY                       Contractures      Additional Factors Info  Allergies, Code Status Code Status Info: DNR Allergies Info: Influenza Vaccines, [place], Adhesive, Azithromycin,  Lipitor, Penicillins           Current Medications (04/03/2015):  This is the current hospital active medication list Current Facility-Administered Medications  Medication Dose Route Frequency Provider Last Rate Last Dose  . acetaminophen (TYLENOL) tablet 500 mg  500 mg Oral Q8H PRN Nita Sells, MD   500 mg at 04/03/15 0949  . AEROCHAMBER PLUS FLO-VU MEDIUM MISC 1 each  1 each Other Once Nita Sells, MD      . albuterol (PROVENTIL HFA;VENTOLIN HFA) 108 (90 Base) MCG/ACT inhaler 1 puff  1 puff Inhalation Q4H PRN Nita Sells, MD      . albuterol (PROVENTIL) (2.5 MG/3ML) 0.083% nebulizer solution 2.5 mg  2.5 mg Nebulization Q4H PRN Nita Sells, MD      . apixaban (ELIQUIS) tablet 2.5 mg  2.5 mg Oral BID Nita Sells, MD   2.5 mg at 04/03/15 0949  . atenolol (TENORMIN) tablet 25 mg  25 mg Oral QHS Nita Sells, MD   25 mg at 04/02/15 2140  . doxycycline (VIBRA-TABS) tablet 100 mg  100 mg Oral Q12H Nita Sells, MD   100 mg at 04/03/15 0950  . furosemide (LASIX) tablet 20 mg  20 mg Oral Daily Nita Sells, MD   20 mg at 04/03/15 0950  . irbesartan (AVAPRO) tablet 75 mg  75 mg Oral Daily Nita Sells, MD   75 mg at 04/03/15  60  . meclizine (ANTIVERT) tablet 25 mg  25 mg Oral Daily PRN Nita Sells, MD      . naphazoline-pheniramine (NAPHCON-A) 0.025-0.3 % ophthalmic solution 2 drop  2 drop Both Eyes BID PRN Nita Sells, MD      . predniSONE (DELTASONE) tablet 60 mg  60 mg Oral QAC breakfast Nita Sells, MD   60 mg at 04/03/15 0849  . sodium chloride 0.9 % injection 3 mL  3 mL Intravenous Q12H Nita Sells, MD   3 mL at 04/02/15 2141  . spironolactone (ALDACTONE) tablet 25 mg  25 mg Oral Daily Nita Sells, MD   25 mg at 04/03/15 0950  . verapamil (CALAN-SR) CR tablet 240 mg  240 mg Oral QHS Nita Sells, MD   240 mg at 04/02/15 2140     Discharge Medications: Please see discharge  summary for a list of discharge medications.  Relevant Imaging Results:  Relevant Lab Results:   Additional Information    Carlean Jews, LCSW

## 2015-05-12 ENCOUNTER — Emergency Department (HOSPITAL_COMMUNITY)
Admission: EM | Admit: 2015-05-12 | Discharge: 2015-05-12 | Disposition: A | Discharge status: Home / Self Care | Payer: Medicare Other | Attending: Emergency Medicine | Admitting: Emergency Medicine

## 2015-05-12 ENCOUNTER — Emergency Department (HOSPITAL_COMMUNITY): Payer: Medicare Other

## 2015-05-12 ENCOUNTER — Encounter (HOSPITAL_COMMUNITY): Payer: Self-pay | Admitting: Emergency Medicine

## 2015-05-12 DIAGNOSIS — M47892 Other spondylosis, cervical region: Secondary | ICD-10-CM | POA: Diagnosis not present

## 2015-05-12 DIAGNOSIS — I4891 Unspecified atrial fibrillation: Secondary | ICD-10-CM | POA: Diagnosis not present

## 2015-05-12 DIAGNOSIS — Z87891 Personal history of nicotine dependence: Secondary | ICD-10-CM | POA: Diagnosis not present

## 2015-05-12 DIAGNOSIS — W01198A Fall on same level from slipping, tripping and stumbling with subsequent striking against other object, initial encounter: Secondary | ICD-10-CM | POA: Diagnosis not present

## 2015-05-12 DIAGNOSIS — S0990XA Unspecified injury of head, initial encounter: Secondary | ICD-10-CM | POA: Insufficient documentation

## 2015-05-12 DIAGNOSIS — Y998 Other external cause status: Secondary | ICD-10-CM | POA: Insufficient documentation

## 2015-05-12 DIAGNOSIS — I10 Essential (primary) hypertension: Secondary | ICD-10-CM | POA: Insufficient documentation

## 2015-05-12 DIAGNOSIS — S0101XA Laceration without foreign body of scalp, initial encounter: Secondary | ICD-10-CM | POA: Diagnosis not present

## 2015-05-12 DIAGNOSIS — Z7951 Long term (current) use of inhaled steroids: Secondary | ICD-10-CM | POA: Insufficient documentation

## 2015-05-12 DIAGNOSIS — Z79899 Other long term (current) drug therapy: Secondary | ICD-10-CM | POA: Insufficient documentation

## 2015-05-12 DIAGNOSIS — E782 Mixed hyperlipidemia: Secondary | ICD-10-CM | POA: Insufficient documentation

## 2015-05-12 DIAGNOSIS — Y9389 Activity, other specified: Secondary | ICD-10-CM | POA: Insufficient documentation

## 2015-05-12 DIAGNOSIS — W19XXXA Unspecified fall, initial encounter: Secondary | ICD-10-CM

## 2015-05-12 DIAGNOSIS — Z853 Personal history of malignant neoplasm of breast: Secondary | ICD-10-CM | POA: Diagnosis not present

## 2015-05-12 DIAGNOSIS — Y9289 Other specified places as the place of occurrence of the external cause: Secondary | ICD-10-CM | POA: Insufficient documentation

## 2015-05-12 DIAGNOSIS — F419 Anxiety disorder, unspecified: Secondary | ICD-10-CM | POA: Insufficient documentation

## 2015-05-12 DIAGNOSIS — Z880 Allergy status to penicillin: Secondary | ICD-10-CM | POA: Diagnosis not present

## 2015-05-12 DIAGNOSIS — S0181XA Laceration without foreign body of other part of head, initial encounter: Secondary | ICD-10-CM | POA: Diagnosis present

## 2015-05-12 DIAGNOSIS — M47812 Spondylosis without myelopathy or radiculopathy, cervical region: Secondary | ICD-10-CM

## 2015-05-12 LAB — CBC WITH DIFFERENTIAL/PLATELET
BASOS ABS: 0 10*3/uL (ref 0.0–0.1)
Basophils Relative: 1 %
EOS ABS: 0.2 10*3/uL (ref 0.0–0.7)
EOS PCT: 2 %
HCT: 40.9 % (ref 36.0–46.0)
HEMOGLOBIN: 13.3 g/dL (ref 12.0–15.0)
Lymphocytes Relative: 18 %
Lymphs Abs: 1.5 10*3/uL (ref 0.7–4.0)
MCH: 30.2 pg (ref 26.0–34.0)
MCHC: 32.5 g/dL (ref 30.0–36.0)
MCV: 93 fL (ref 78.0–100.0)
Monocytes Absolute: 1 10*3/uL (ref 0.1–1.0)
Monocytes Relative: 12 %
NEUTROS PCT: 67 %
Neutro Abs: 5.6 10*3/uL (ref 1.7–7.7)
PLATELETS: 262 10*3/uL (ref 150–400)
RBC: 4.4 MIL/uL (ref 3.87–5.11)
RDW: 14.6 % (ref 11.5–15.5)
WBC: 8.2 10*3/uL (ref 4.0–10.5)

## 2015-05-12 LAB — URINALYSIS, ROUTINE W REFLEX MICROSCOPIC
BILIRUBIN URINE: NEGATIVE
Glucose, UA: NEGATIVE mg/dL
HGB URINE DIPSTICK: NEGATIVE
Ketones, ur: NEGATIVE mg/dL
NITRITE: NEGATIVE
PROTEIN: NEGATIVE mg/dL
SPECIFIC GRAVITY, URINE: 1.009 (ref 1.005–1.030)
pH: 7.5 (ref 5.0–8.0)

## 2015-05-12 LAB — BASIC METABOLIC PANEL
Anion gap: 10 (ref 5–15)
BUN: 19 mg/dL (ref 6–20)
CHLORIDE: 94 mmol/L — AB (ref 101–111)
CO2: 28 mmol/L (ref 22–32)
CREATININE: 0.96 mg/dL (ref 0.44–1.00)
Calcium: 10.8 mg/dL — ABNORMAL HIGH (ref 8.9–10.3)
GFR, EST NON AFRICAN AMERICAN: 52 mL/min — AB (ref >60)
Glucose, Bld: 110 mg/dL — ABNORMAL HIGH (ref 65–99)
POTASSIUM: 4.4 mmol/L (ref 3.5–5.1)
SODIUM: 132 mmol/L — AB (ref 135–145)

## 2015-05-12 LAB — URINE MICROSCOPIC-ADD ON: RBC / HPF: NONE SEEN RBC/hpf (ref 0–5)

## 2015-05-12 NOTE — Discharge Instructions (Signed)
Your scalp wound will heal in 5-7 days. Since you're choosing to not have it closed, it will tend to bleed somewhat, during this period of time. For pain, take Tylenol. Return here if you have complications with the head injury.   Head Injury, Adult You have a head injury. Headaches and throwing up (vomiting) are common after a head injury. It should be easy to wake up from sleeping. Sometimes you must stay in the hospital. Most problems happen within the first 24 hours. Side effects may occur up to 7-10 days after the injury.  WHAT ARE THE TYPES OF HEAD INJURIES? Head injuries can be as minor as a bump. Some head injuries can be more severe. More severe head injuries include:  A jarring injury to the brain (concussion).  A bruise of the brain (contusion). This mean there is bleeding in the brain that can cause swelling.  A cracked skull (skull fracture).  Bleeding in the brain that collects, clots, and forms a bump (hematoma). WHEN SHOULD I GET HELP RIGHT AWAY?   You are confused or sleepy.  You cannot be woken up.  You feel sick to your stomach (nauseous) or keep throwing up (vomiting).  Your dizziness or unsteadiness is getting worse.  You have very bad, lasting headaches that are not helped by medicine. Take medicines only as told by your doctor.  You cannot use your arms or legs like normal.  You cannot walk.  You notice changes in the black spots in the center of the colored part of your eye (pupil).  You have clear or bloody fluid coming from your nose or ears.  You have trouble seeing. During the next 24 hours after the injury, you must stay with someone who can watch you. This person should get help right away (call 911 in the [place] if you start to shake and are not able to control it (have seizures), you pass out, or you are unable to wake up. HOW CAN I PREVENT A HEAD INJURY IN THE FUTURE?  Wear seat belts.  Wear a helmet while bike riding and playing sports like  football.  Stay away from dangerous activities around the house. WHEN CAN I RETURN TO NORMAL ACTIVITIES AND ATHLETICS? See your doctor before doing these activities. You should not do normal activities or play contact sports until 1 week after the following symptoms have stopped:  Headache that does not go away.  Dizziness.  Poor attention.  Confusion.  Memory problems.  Sickness to your stomach or throwing up.  Tiredness.  Fussiness.  Bothered by bright lights or loud noises.  Anxiousness or depression.  Restless sleep. MAKE SURE YOU:   Understand these instructions.  Will watch your condition.  Will get help right away if you are not doing well or get worse.   This information is not intended to replace advice given to you by your health care provider. Make sure you discuss any questions you have with your health care provider.   Document Released: 03/01/2008 Document Revised: 04/09/2014 Document Reviewed: 11/24/2012 Elsevier Interactive Patient Education 2016 [place], Adult A laceration is a cut that goes through all layers of the skin. The cut also goes into the tissue that is right under the skin. Some cuts heal on their own. Others need to be closed with stitches (sutures), staples, skin adhesive strips, or wound glue. Taking care of your cut lowers your risk of infection and helps your cut to heal better. HOW TO  TAKE CARE OF YOUR CUT For stitches or staples:  Keep the wound clean and dry.  If you were given a bandage (dressing), you should change it at least one time per day or as told by your doctor. You should also change it if it gets wet or dirty.  Keep the wound completely dry for the first 24 hours or as told by your doctor. After that time, you may take a shower or a bath. However, make sure that the wound is not soaked in water until after the stitches or staples have been removed.  Clean the wound one time each day or as told  by your doctor:  Wash the wound with soap and water.  Rinse the wound with water until all of the soap comes off.  Pat the wound dry with a clean towel. Do not rub the wound.  After you clean the wound, put a thin layer of antibiotic ointment on it as told by your doctor. This ointment:  Helps to prevent infection.  Keeps the bandage from sticking to the wound.  Have your stitches or staples removed as told by your doctor. If your doctor used skin adhesive strips:   Keep the wound clean and dry.  If you were given a bandage, you should change it at least one time per day or as told by your doctor. You should also change it if it gets dirty or wet.  Do not get the skin adhesive strips wet. You can take a shower or a bath, but be careful to keep the wound dry.  If the wound gets wet, pat it dry with a clean towel. Do not rub the wound.  Skin adhesive strips fall off on their own. You can trim the strips as the wound heals. Do not remove any strips that are still stuck to the wound. They will fall off after a while. If your doctor used wound glue:  Try to keep your wound dry, but you may briefly wet it in the shower or bath. Do not soak the wound in water, such as by swimming.  After you take a shower or a bath, gently pat the wound dry with a clean towel. Do not rub the wound.  Do not do any activities that will make you really sweaty until the skin glue has fallen off on its own.  Do not apply liquid, cream, or ointment medicine to your wound while the skin glue is still on.  If you were given a bandage, you should change it at least one time per day or as told by your doctor. You should also change it if it gets dirty or wet.  If a bandage is placed over the wound, do not let the tape for the bandage touch the skin glue.  Do not pick at the glue. The skin glue usually stays on for 5-10 days. Then, it falls off of the skin. General Instructions  To help prevent scarring,  make sure to cover your wound with sunscreen whenever you are outside after stitches are removed, after adhesive strips are removed, or when wound glue stays in place and the wound is healed. Make sure to wear a sunscreen of at least 30 SPF.  Take over-the-counter and prescription medicines only as told by your doctor.  If you were given antibiotic medicine or ointment, take or apply it as told by your doctor. Do not stop using the antibiotic even if your wound is getting better.  Do  not scratch or pick at the wound.  Keep all follow-up visits as told by your doctor. This is important.  Check your wound every day for signs of infection. Watch for:  Redness, swelling, or pain.  Fluid, blood, or pus.  Raise (elevate) the injured area above the level of your heart while you are sitting or lying down, if possible. GET HELP IF:  You got a tetanus shot and you have any of these problems at the injection site:  Swelling.  Very bad pain.  Redness.  Bleeding.  You have a fever.  A wound that was closed breaks open.  You notice a bad smell coming from your wound or your bandage.  You notice something coming out of the wound, such as wood or glass.  Medicine does not help your pain.  You have more redness, swelling, or pain at the site of your wound.  You have fluid, blood, or pus coming from your wound.  You notice a change in the color of your skin near your wound.  You need to change the bandage often because fluid, blood, or pus is coming from the wound.  You start to have a new rash.  You start to have numbness around the wound. GET HELP RIGHT AWAY IF:  You have very bad swelling around the wound.  Your pain suddenly gets worse and is very bad.  You notice painful lumps near the wound or on skin that is anywhere on your body.  You have a red streak going away from your wound.  The wound is on your hand or foot and you cannot move a finger or toe like you usually  can.  The wound is on your hand or foot and you notice that your fingers or toes look pale or bluish.   This information is not intended to replace advice given to you by your health care provider. Make sure you discuss any questions you have with your health care provider.   Document Released: 09/05/2007 Document Revised: 08/03/2014 Document Reviewed: 03/15/2014 Elsevier Interactive Patient Education [employer].

## 2015-05-12 NOTE — ED Notes (Signed)
Stapler at bedside. EDP at bedside

## 2015-05-12 NOTE — ED Notes (Signed)
Pt refused dsg on head.

## 2015-05-12 NOTE — ED Notes (Signed)
Pt given something to eat/drink. Wound care completed.

## 2015-05-12 NOTE — ED Notes (Signed)
Pt comes from [facility] via EMS with a fall and head laceration to right side of head. Pt reports feeling sleepy and fell. Pt denies LOC or dizziness. VSS: BP 137/87, P87, R16, 97% Rm Air.

## 2015-05-12 NOTE — ED Provider Notes (Signed)
CSN: [id number]     Arrival date & time 05/12/15  1638 History   First MD Initiated Contact with Patient 05/12/15 1657     Chief Complaint  Patient presents with  . Fall  . Head Laceration     (Consider location/radiation/quality/duration/timing/severity/associated sxs/prior Treatment) HPI   Alexandra Luna is a 82 y.o. female who presents for evaluation of injury to head, from fall. She states that she stood up from her bed, felt unsteady, and fell, striking her head on an object. She summoned help by pressing her Lifeline button, and was able to get up on her own and ambulate. Her only injury is her head. She denies neck pain, back pain, chest pain, nausea, vomiting, weakness or dizziness. He has been eating well. She has not had a recent illnesses. Reports that she takes her medicines as prescribed. There are no other known modifying factors.    Past Medical History  Diagnosis Date  . Renal hypertension 03/07/2005    no evidence of significant diameter reduction, dissection, tortuosity, FMD or orther vascular abnormality; kidneys equal in size, symmetry, with normal cortex and medulla  . Palpitations   . Mixed hyperlipidemia   . Anxiety   . Atrial fibrillation (HCC)     On chronic Eliquis  . Breast cancer ([place]) dx'd 1997    left; lt lumpectomy and XRT  . Pre-diabetes    Past Surgical History  Procedure Laterality Date  . Breast lumpectomy    . Tonsillectomy    . Cesarean section      x 3  . Doppler echocardiography  08/29/2011    EF [id number];  LV systolic fcn normal; doppler flow pattern suggestive of impaired LV relaxation; LA mildly dilated; aortic valve mildly sclerotic, no aortic stenosis  . Cardiovascular stress test  526/2011    R/Dipyridamole - EF 71%; normal myocaridal perfusion w/o evidence of ischemia or infarct; no schintigraphic evidence of inducible myocaridal ischemia; global LV function normal; EKG negative for ischemia; low risk scan   . Cardioversion N/A 11/12/2012     Procedure: CARDIOVERSION;  Surgeon: Troy Sine, MD;  Location: [facility] ENDOSCOPY;  Service: Cardiovascular;  Laterality: N/A;  . Breast lumpectomy  1998    left side   Family History  Problem Relation Age of Onset  . Heart failure Mother   . Breast cancer Mother   . Hypertension Father   . Kidney cancer Father   . Stroke Maternal Grandmother    Social History  Substance Use Topics  . Smoking status: Former Smoker -- 0.75 packs/day for 10 years    Types: Cigarettes    Quit date: 04/02/1988  . Smokeless tobacco: Never Used  . Alcohol Use: 0.0 oz/week    0 Standard drinks or equivalent per week     Comment: 1/4 glass of wine every 2 weeks   OB History    No data available     Review of Systems  All other systems reviewed and are negative.     Allergies  Influenza vaccines; [place]; Adhesive; Azithromycin; Lipitor; and Penicillins  Home Medications   Prior to Admission medications   Medication Sig Start Date End Date Taking? Authorizing Provider  acetaminophen (TYLENOL) 325 MG tablet Take 650 mg by mouth 2 (two) times daily as needed for mild pain.   Yes Historical Provider, MD  albuterol (PROVENTIL HFA;VENTOLIN HFA) 108 (90 Base) MCG/ACT inhaler Inhale 1 puff into the lungs every 4 (four) hours as needed for wheezing or shortness of breath.  04/03/15  Yes Nita Sells, MD  apixaban (ELIQUIS) 2.5 MG TABS tablet Take 1 tablet (2.5 mg total) by mouth 2 (two) times daily. 12/23/13  Yes Troy Sine, MD  atenolol (TENORMIN) 25 MG tablet Take 1 tablet (25 mg total) by mouth at bedtime. 07/01/14  Yes Robbie Lis, MD  calcium carbonate (OS-CAL) 600 MG TABS Take 600 mg by mouth daily.    Yes Historical Provider, MD  feeding supplement, ENSURE ENLIVE, (ENSURE ENLIVE) LIQD Take 237 mLs by mouth 2 (two) times daily between meals. 07/01/14  Yes Robbie Lis, MD  fluticasone [facility]) 50 MCG/ACT nasal spray Place 1 spray into both nostrils at bedtime.  07/04/12  Yes Historical Provider,  MD  furosemide (LASIX) 20 MG tablet Take 1 tablet (20 mg total) by mouth daily. 08/15/14  Yes Robbie Lis, MD  metoCLOPramide (REGLAN) 10 MG tablet Take 1 tablet (10 mg total) by mouth every 8 (eight) hours as needed for nausea. 03/21/15  Yes Harvel Quale, MD  Multiple Vitamins-Iron (MULTIVITAMINS WITH IRON) TABS tablet Take 1 tablet by mouth daily.   Yes Historical Provider, MD  naphazoline (NAPHCON) 0.1 % ophthalmic solution Place 2 drops into both eyes 2 (two) times daily as needed for irritation.   Yes Historical Provider, MD  rosuvastatin (CRESTOR) 10 MG tablet Take 1 tablet (10 mg total) by mouth every other day. 05/10/14  Yes Troy Sine, MD  Spacer/Aero-Holding Chambers (AEROCHAMBER PLUS FLO-VU MEDIUM) MISC 1 each by Other route once. 04/03/15  Yes Nita Sells, MD  spironolactone (ALDACTONE) 25 MG tablet Take 1 tablet by mouth daily. 02/17/15  Yes Historical Provider, MD  valsartan (DIOVAN) 40 MG tablet Take 1 tablet (40 mg total) by mouth daily. Patient taking differently: Take 20 mg by mouth daily.  02/15/14  Yes Troy Sine, MD  verapamil (VERELAN PM) 240 MG 24 hr capsule Take 1 capsule (240 mg total) by mouth at bedtime. 05/03/14  Yes Troy Sine, MD  ondansetron (ZOFRAN) 4 MG tablet Take 1 tablet (4 mg total) by mouth every 6 (six) hours as needed for nausea. Patient not taking: Reported on 05/12/2015 08/15/14   Robbie Lis, MD  predniSONE (DELTASONE) 20 MG tablet Take 3 tablets (60 mg total) by mouth daily before breakfast. Patient not taking: Reported on 05/12/2015 04/03/15   Nita Sells, MD   BP 151/77 mmHg  Pulse 88  Temp(Src) 98.8 F (37.1 C) (Oral)  Resp 18  SpO2 100% Physical Exam  Constitutional: She is oriented to person, place, and time. She appears well-developed and well-nourished. No distress.  HENT:  Head: Normocephalic and atraumatic.  Right Ear: External ear normal.  Left Ear: External ear normal.  Contusion, right parietal, with 2 cm  laceration, which is not actively bleeding.  Eyes: Conjunctivae and EOM are normal. Pupils are equal, round, and reactive to light.  Neck: Normal range of motion and phonation normal. Neck supple.  Cardiovascular: Normal rate, regular rhythm and normal heart sounds.   Pulmonary/Chest: Effort normal and breath sounds normal. She exhibits no bony tenderness.  Abdominal: Soft. There is no tenderness.  Musculoskeletal: Normal range of motion.  Neurological: She is alert and oriented to person, place, and time. No cranial nerve deficit or sensory deficit. She exhibits normal muscle tone. Coordination normal.  Skin: Skin is warm, dry and intact.  Psychiatric: She has a normal mood and affect. Her behavior is normal. Judgment and thought content normal.  Nursing note and vitals reviewed.  ED Course  Procedures (including critical care time)  Medications - No data to display  Patient Vitals for the past 24 hrs:  BP Temp Temp src Pulse Resp SpO2  05/12/15 1946 151/77 mmHg - - 88 18 100 %  05/12/15 1933 151/77 mmHg - - - - -  05/12/15 1800 142/64 mmHg - - 91 16 98 %  05/12/15 1745 146/69 mmHg - - - 23 -  05/12/15 1730 148/65 mmHg - - 87 17 97 %  05/12/15 1715 144/68 mmHg - - 90 17 98 %  05/12/15 1700 130/68 mmHg - - 86 26 99 %  05/12/15 1646 137/85 mmHg 98.8 F (37.1 C) Oral 90 20 99 %  05/12/15 1645 137/85 mmHg - - - - -    7:55 PM Reevaluation with update and discussion. After initial assessment and treatment, an updated evaluation reveals she is comfortable. She declined closure of the scalp wound. I discussed with the patient. All questions were answered. Juliet Vasbinder L    Labs Review Labs Reviewed  URINALYSIS, ROUTINE W REFLEX MICROSCOPIC (NOT AT [facility]) - Abnormal; Notable for the following:    Leukocytes, UA TRACE (*)    All other components within normal limits  BASIC METABOLIC PANEL - Abnormal; Notable for the following:    Sodium 132 (*)    Chloride 94 (*)    Glucose, Bld  110 (*)    Calcium 10.8 (*)    GFR calc non Af Amer 52 (*)    All other components within normal limits  URINE MICROSCOPIC-ADD ON - Abnormal; Notable for the following:    Squamous Epithelial / LPF 0-5 (*)    Bacteria, UA RARE (*)    All other components within normal limits  URINE CULTURE  CBC WITH DIFFERENTIAL/PLATELET    Imaging Review Ct Head Wo Contrast  05/12/2015  CLINICAL DATA:  Pt fell getting out of bed this morning and hit her head against a wall on the right side. Pt report slight headache earlier today and states she feels "a little dopey." EXAM: CT HEAD WITHOUT CONTRAST CT CERVICAL SPINE WITHOUT CONTRAST TECHNIQUE: Multidetector CT imaging of the head and cervical spine was performed following the standard protocol without intravenous contrast. Multiplanar CT image reconstructions of the cervical spine were also generated. COMPARISON:  06/28/2014 FINDINGS: CT HEAD FINDINGS The ventricles are normal in configuration. There is age related ventricular and sulcal enlargement. No hydrocephalus. There are no parenchymal masses or mass effect. There is no evidence of a recent cortical infarct. 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 There is mild periventricular white matter hypoattenuation consistent with chronic microvascular ischemic change. There are no extra-axial masses or abnormal fluid collections. There is no intracranial hemorrhage. No skull fracture. The visualized sinuses and mastoid air cells are clear. CT CERVICAL SPINE FINDINGS No fracture. Slight anterolisthesis of C3 on C4 and C4 on C5, stable. There is also stable mild kyphosis with the apex at C5. Mild loss of disc height at C4-C5 with moderate loss disc height at C5-C6 and C6-C7. There is endplate spurring most evident at these levels with neural foraminal narrowing. Facet degenerative changes noted bilaterally most severe on the left at C2-C3 and C3-C4 and on  the right at C4-C5. Soft tissues are unremarkable. Lung apices: There are subtle hazy ground-glass opacities in the left upper lobe and mild interstitial prominence. This is nonspecific. Consider mild edema or infection in the proper clinical setting. IMPRESSION:  HEAD CT:  No acute intracranial abnormalities.  No skull fracture. CERVICAL CT: No fracture or acute bony abnormality. There are significant degenerative changes which are stable from the prior study. Mild areas of ground-glass opacity in the left lung apex. Consider pneumonia or mild asymmetric edema in the proper clinical setting. Electronically Signed   By: Lajean Manes M.D.   On: 05/12/2015 18:35   Ct Cervical Spine Wo Contrast  05/12/2015  CLINICAL DATA:  Pt fell getting out of bed this morning and hit her head against a wall on the right side. Pt report slight headache earlier today and states she feels "a little dopey." EXAM: CT HEAD WITHOUT CONTRAST CT CERVICAL SPINE WITHOUT CONTRAST TECHNIQUE: Multidetector CT imaging of the head and cervical spine was performed following the standard protocol without intravenous contrast. Multiplanar CT image reconstructions of the cervical spine were also generated. COMPARISON:  06/28/2014 FINDINGS: CT HEAD FINDINGS The ventricles are normal in configuration. There is age related ventricular and sulcal enlargement. No hydrocephalus. There are no parenchymal masses or mass effect. There is no evidence of a recent cortical infarct. 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 There is mild periventricular white matter hypoattenuation consistent with chronic microvascular ischemic change. There are no extra-axial masses or abnormal fluid collections. There is no intracranial hemorrhage. No skull fracture. The visualized sinuses and mastoid air cells are clear. CT CERVICAL SPINE FINDINGS No fracture. Slight anterolisthesis of C3 on C4 and C4 on C5,  stable. There is also stable mild kyphosis with the apex at C5. Mild loss of disc height at C4-C5 with moderate loss disc height at C5-C6 and C6-C7. There is endplate spurring most evident at these levels with neural foraminal narrowing. Facet degenerative changes noted bilaterally most severe on the left at C2-C3 and C3-C4 and on the right at C4-C5. Soft tissues are unremarkable. Lung apices: There are subtle hazy ground-glass opacities in the left upper lobe and mild interstitial prominence. This is nonspecific. Consider mild edema or infection in the proper clinical setting. IMPRESSION: HEAD CT:  No acute intracranial abnormalities.  No skull fracture. CERVICAL CT: No fracture or acute bony abnormality. There are significant degenerative changes which are stable from the prior study. Mild areas of ground-glass opacity in the left lung apex. Consider pneumonia or mild asymmetric edema in the proper clinical setting. Electronically Signed   By: Lajean Manes M.D.   On: 05/12/2015 18:35   I have personally reviewed and evaluated these images and lab results as part of my medical decision-making.   EKG Interpretation None      MDM   Final diagnoses:  Fall, initial encounter  Laceration of scalp, initial encounter  Head injury, initial encounter  DJD (degenerative joint disease), cervical    Fall, without serious injury. Incidental note of cervical arthritis. No evidence of provocative cause of fall.  Nursing Notes Reviewed/ Care Coordinated Applicable Imaging Reviewed Interpretation of Laboratory Data incorporated into ED treatment  The patient appears reasonably screened and/or stabilized for discharge and I doubt any other medical condition or other [facility] requiring further screening, evaluation, or treatment in the ED at this time prior to discharge.  Plan: Home Medications- usual; Home Treatments- rest; return here if the recommended treatment, does not improve the symptoms; Recommended  follow up- pcp prn   Daleen Bo, MD 05/12/15 2001

## 2015-05-12 NOTE — ED Notes (Signed)
Patient transported to CT 

## 2015-05-13 LAB — URINE CULTURE: SPECIAL REQUESTS: NORMAL

## 2015-05-24 ENCOUNTER — Ambulatory Visit: Payer: Medicare Other | Admitting: Cardiovascular Disease

## 2016-08-25 ENCOUNTER — Emergency Department (HOSPITAL_COMMUNITY): Payer: Medicare Other

## 2016-08-25 ENCOUNTER — Encounter (HOSPITAL_COMMUNITY): Payer: Self-pay | Admitting: Emergency Medicine

## 2016-08-25 ENCOUNTER — Emergency Department (HOSPITAL_COMMUNITY)
Admission: EM | Admit: 2016-08-25 | Discharge: 2016-08-25 | Disposition: A | Discharge status: Skilled Nursing Facility | Payer: Medicare Other | Attending: Emergency Medicine | Admitting: Emergency Medicine

## 2016-08-25 DIAGNOSIS — Z79899 Other long term (current) drug therapy: Secondary | ICD-10-CM | POA: Insufficient documentation

## 2016-08-25 DIAGNOSIS — I10 Essential (primary) hypertension: Secondary | ICD-10-CM | POA: Insufficient documentation

## 2016-08-25 DIAGNOSIS — M542 Cervicalgia: Secondary | ICD-10-CM

## 2016-08-25 DIAGNOSIS — J449 Chronic obstructive pulmonary disease, unspecified: Secondary | ICD-10-CM | POA: Diagnosis not present

## 2016-08-25 DIAGNOSIS — Z7901 Long term (current) use of anticoagulants: Secondary | ICD-10-CM | POA: Insufficient documentation

## 2016-08-25 DIAGNOSIS — C50912 Malignant neoplasm of unspecified site of left female breast: Secondary | ICD-10-CM | POA: Insufficient documentation

## 2016-08-25 DIAGNOSIS — N3000 Acute cystitis without hematuria: Secondary | ICD-10-CM | POA: Diagnosis not present

## 2016-08-25 DIAGNOSIS — Z87891 Personal history of nicotine dependence: Secondary | ICD-10-CM | POA: Diagnosis not present

## 2016-08-25 LAB — URINALYSIS, ROUTINE W REFLEX MICROSCOPIC
Bilirubin Urine: NEGATIVE
Glucose, UA: NEGATIVE mg/dL
Hgb urine dipstick: NEGATIVE
KETONES UR: NEGATIVE mg/dL
Nitrite: NEGATIVE
PROTEIN: NEGATIVE mg/dL
Specific Gravity, Urine: 1.012 (ref 1.005–1.030)
pH: 5 (ref 5.0–8.0)

## 2016-08-25 LAB — COMPREHENSIVE METABOLIC PANEL
ALK PHOS: 68 U/L (ref 38–126)
ALT: 12 U/L — AB (ref 14–54)
AST: 20 U/L (ref 15–41)
Albumin: 3.4 g/dL — ABNORMAL LOW (ref 3.5–5.0)
Anion gap: 10 (ref 5–15)
BUN: 28 mg/dL — AB (ref 6–20)
CALCIUM: 9.9 mg/dL (ref 8.9–10.3)
CO2: 25 mmol/L (ref 22–32)
CREATININE: 1.24 mg/dL — AB (ref 0.44–1.00)
Chloride: 98 mmol/L — ABNORMAL LOW (ref 101–111)
GFR calc non Af Amer: 38 mL/min — ABNORMAL LOW (ref >60)
GFR, EST AFRICAN AMERICAN: 44 mL/min — AB (ref >60)
GLUCOSE: 117 mg/dL — AB (ref 65–99)
Potassium: 4.5 mmol/L (ref 3.5–5.1)
SODIUM: 133 mmol/L — AB (ref 135–145)
Total Bilirubin: 0.7 mg/dL (ref 0.3–1.2)
Total Protein: 6.7 g/dL (ref 6.5–8.1)

## 2016-08-25 LAB — CBC WITH DIFFERENTIAL/PLATELET
Basophils Absolute: 0 10*3/uL (ref 0.0–0.1)
Basophils Relative: 0 %
EOS ABS: 0 10*3/uL (ref 0.0–0.7)
Eosinophils Relative: 0 %
HCT: 35.3 % — ABNORMAL LOW (ref 36.0–46.0)
HEMOGLOBIN: 12 g/dL (ref 12.0–15.0)
LYMPHS ABS: 1 10*3/uL (ref 0.7–4.0)
LYMPHS PCT: 8 %
MCH: 31.5 pg (ref 26.0–34.0)
MCHC: 34 g/dL (ref 30.0–36.0)
MCV: 92.7 fL (ref 78.0–100.0)
Monocytes Absolute: 1.4 10*3/uL — ABNORMAL HIGH (ref 0.1–1.0)
Monocytes Relative: 11 %
NEUTROS PCT: 81 %
Neutro Abs: 10.7 10*3/uL — ABNORMAL HIGH (ref 1.7–7.7)
Platelets: 186 10*3/uL (ref 150–400)
RBC: 3.81 MIL/uL — AB (ref 3.87–5.11)
RDW: 14.9 % (ref 11.5–15.5)
WBC: 13.1 10*3/uL — AB (ref 4.0–10.5)

## 2016-08-25 LAB — I-STAT CG4 LACTIC ACID, ED: Lactic Acid, Venous: 1.1 mmol/L (ref 0.5–1.9)

## 2016-08-25 MED ORDER — CEPHALEXIN 500 MG PO CAPS
500.0000 mg | ORAL_CAPSULE | Freq: Once | ORAL | Status: AC
  Administered 2016-08-25: 500 mg via ORAL
  Filled 2016-08-25: qty 1

## 2016-08-25 MED ORDER — OXYCODONE-ACETAMINOPHEN 5-325 MG PO TABS
1.0000 | ORAL_TABLET | Freq: Once | ORAL | Status: AC
  Administered 2016-08-25: 1 via ORAL
  Filled 2016-08-25: qty 1

## 2016-08-25 MED ORDER — SODIUM CHLORIDE 0.9 % IV BOLUS (SEPSIS)
1000.0000 mL | Freq: Once | INTRAVENOUS | Status: AC
  Administered 2016-08-25: 1000 mL via INTRAVENOUS

## 2016-08-25 MED ORDER — CEPHALEXIN 500 MG PO CAPS: 500.0000 mg | ORAL_CAPSULE | Freq: Four times a day (QID) | ORAL | 0 refills | Status: DC

## 2016-08-25 MED ORDER — TRAMADOL HCL 50 MG PO TABS: 50.0000 mg | ORAL_TABLET | Freq: Four times a day (QID) | ORAL | 0 refills | Status: DC | PRN

## 2016-08-25 NOTE — ED Triage Notes (Signed)
Pt from [facility] assisted living facility. Co non traumatic bilateral neck pain x 1 week

## 2016-08-25 NOTE — ED Provider Notes (Signed)
[facility] DEPT Provider Note   CSN: [id number] Arrival date & time: 08/25/16  1058     History   Chief Complaint Chief Complaint  Patient presents with  . Neck Pain    HPI Alexandra Luna is a 83 y.o. female.  Patient presented with pain to the left lateral neck. No history trauma. No fever no chills. No pain to the back of her neck.   The history is provided by the patient. No language interpreter was used.  Neck Pain   This is a new problem. The current episode started more than 2 days ago. The problem occurs constantly. The problem has not changed since onset.The pain is associated with nothing. There has been no fever. Pain location: Left lateral neck. Pertinent negatives include no chest pain and no headaches.    Past Medical History:  Diagnosis Date  . Anxiety   . Atrial fibrillation (HCC)    On chronic Eliquis  . Breast cancer ([place]) dx'd 1997   left; lt lumpectomy and XRT  . Mixed hyperlipidemia   . Palpitations   . Pre-diabetes   . Renal hypertension 03/07/2005   no evidence of significant diameter reduction, dissection, tortuosity, FMD or orther vascular abnormality; kidneys equal in size, symmetry, with normal cortex and medulla    Patient Active Problem List   Diagnosis Date Noted  . COPD exacerbation ([place]) 04/01/2015  . Bilateral pleural effusion 08/30/2014  . Multiple pulmonary nodules 08/30/2014  . Acute diastolic congestive heart failure ([place]) 08/14/2014  . Acute respiratory failure with hypoxia ([place]) 08/14/2014  . Chronic anticoagulation 12/08/2012  . AF (atrial fibrillation) ([place]) 09/22/2012  . Essential hypertension 09/22/2012  . Hyperlipidemia, mixed 09/22/2012  . History of breast cancer in female 04/28/2012    Past Surgical History:  Procedure Laterality Date  . BREAST LUMPECTOMY    . BREAST LUMPECTOMY  1998   left side  . CARDIOVASCULAR STRESS TEST  526/2011   R/Dipyridamole - EF 71%; normal myocaridal perfusion w/o evidence of  ischemia or infarct; no schintigraphic evidence of inducible myocaridal ischemia; global LV function normal; EKG negative for ischemia; low risk scan   . CARDIOVERSION N/A 11/12/2012   Procedure: CARDIOVERSION;  Surgeon: Troy Sine, MD;  Location: [facility];  Service: Cardiovascular;  Laterality: N/A;  . CESAREAN SECTION     x 3  . DOPPLER ECHOCARDIOGRAPHY  08/29/2011   EF >33%;  LV systolic fcn normal; doppler flow pattern suggestive of impaired LV relaxation; LA mildly dilated; aortic valve mildly sclerotic, no aortic stenosis  . tonsillectomy      OB History    No data available       Home Medications    Prior to Admission medications   Medication Sig Start Date End Date Taking? Authorizing Provider  acetaminophen (TYLENOL) 325 MG tablet Take 650 mg by mouth 2 (two) times daily as needed for mild pain.    [provider]  albuterol (PROVENTIL HFA;VENTOLIN HFA) 108 (90 Base) MCG/ACT inhaler Inhale 1 puff into the lungs every 4 (four) hours as needed for wheezing or shortness of breath. 04/03/15   Nita Sells, MD  apixaban (ELIQUIS) 2.5 MG TABS tablet Take 1 tablet (2.5 mg total) by mouth 2 (two) times daily. 12/23/13   Troy Sine, MD  atenolol (TENORMIN) 25 MG tablet Take 1 tablet (25 mg total) by mouth at bedtime. 07/01/14   Robbie Lis, MD  calcium carbonate (OS-CAL) 600 MG TABS Take 600 mg by mouth daily.  [provider]  cephALEXin (KEFLEX) 500 MG capsule Take 1 capsule (500 mg total) by mouth 4 (four) times daily. 08/25/16   Milton Ferguson, MD  feeding supplement, ENSURE ENLIVE, (ENSURE ENLIVE) LIQD Take 237 mLs by mouth 2 (two) times daily between meals. 07/01/14   Robbie Lis, MD  fluticasone [facility]) 50 MCG/ACT nasal spray Place 1 spray into both nostrils at bedtime.  07/04/12   [provider]  furosemide (LASIX) 20 MG tablet Take 1 tablet (20 mg total) by mouth daily. 08/15/14   Robbie Lis, MD  metoCLOPramide (REGLAN) 10 MG  tablet Take 1 tablet (10 mg total) by mouth every 8 (eight) hours as needed for nausea. 03/21/15   Harvel Quale, MD  Multiple Vitamins-Iron (MULTIVITAMINS WITH IRON) TABS tablet Take 1 tablet by mouth daily.    [provider]  naphazoline (NAPHCON) 0.1 % ophthalmic solution Place 2 drops into both eyes 2 (two) times daily as needed for irritation.    [provider]  ondansetron (ZOFRAN) 4 MG tablet Take 1 tablet (4 mg total) by mouth every 6 (six) hours as needed for nausea. Patient not taking: Reported on 05/12/2015 08/15/14   Robbie Lis, MD  predniSONE (DELTASONE) 20 MG tablet Take 3 tablets (60 mg total) by mouth daily before breakfast. Patient not taking: Reported on 05/12/2015 04/03/15   Nita Sells, MD  rosuvastatin (CRESTOR) 10 MG tablet Take 1 tablet (10 mg total) by mouth every other day. 05/10/14   Troy Sine, MD  Spacer/Aero-Holding Chambers (AEROCHAMBER PLUS FLO-VU MEDIUM) MISC 1 each by Other route once. 04/03/15   Nita Sells, MD  spironolactone (ALDACTONE) 25 MG tablet Take 1 tablet by mouth daily. 02/17/15   [provider]  traMADol (ULTRAM) 50 MG tablet Take 1 tablet (50 mg total) by mouth every 6 (six) hours as needed. 08/25/16   Milton Ferguson, MD  valsartan (DIOVAN) 40 MG tablet Take 1 tablet (40 mg total) by mouth daily. Patient taking differently: Take 20 mg by mouth daily.  02/15/14   Troy Sine, MD  verapamil (VERELAN PM) 240 MG 24 hr capsule Take 1 capsule (240 mg total) by mouth at bedtime. 05/03/14   Troy Sine, MD    Family History Family History  Problem Relation Age of Onset  . Heart failure Mother   . Breast cancer Mother   . Hypertension Father   . Kidney cancer Father   . Stroke Maternal Grandmother     Social History Social History  Substance Use Topics  . Smoking status: Former Smoker    Packs/day: 0.75    Years: 10.00    Types: Cigarettes    Quit date: 04/02/1988  . Smokeless tobacco: Never  Used  . Alcohol use 0.0 oz/week     Comment: 1/4 glass of wine every 2 weeks     Allergies   Influenza vaccines; [place] [trandolapril-verapamil hcl er]; Adhesive [tape]; Azithromycin; Lipitor [atorvastatin]; and Penicillins   Review of Systems Review of Systems  Constitutional: Negative for appetite change and fatigue.  HENT: Negative for congestion, ear discharge and sinus pressure.   Eyes: Negative for discharge.  Respiratory: Negative for cough.   Cardiovascular: Negative for chest pain.  Gastrointestinal: Negative for abdominal pain and diarrhea.  Genitourinary: Negative for frequency and hematuria.  Musculoskeletal: Positive for neck pain. Negative for back pain.  Skin: Negative for rash.  Neurological: Negative for seizures and headaches.  Psychiatric/Behavioral: Negative for hallucinations.     Physical  Exam Updated Vital Signs BP (!) 102/49 (BP Location: Right Arm)   Pulse 61   Temp 97.8 F (36.6 C) (Oral)   Resp 16   Ht 5\' 6"  (1.676 m)   Wt 87.3 kg (192 lb 8 oz)   SpO2 98%   BMI 31.07 kg/m   Physical Exam  Constitutional: She is oriented to person, place, and time. She appears well-developed.  HENT:  Head: Normocephalic.  Supple neck. Patient is very tender to the inferior left lateral neck.  Eyes: Conjunctivae and EOM are normal. No scleral icterus.  Neck: Neck supple. No thyromegaly present.  Cardiovascular: Normal rate and regular rhythm.  Exam reveals no gallop and no friction rub.   No murmur heard. Pulmonary/Chest: No stridor. She has no wheezes. She has no rales. She exhibits no tenderness.  Abdominal: She exhibits no distension. There is no tenderness. There is no rebound.  Musculoskeletal: Normal range of motion. She exhibits no edema.  Lymphadenopathy:    She has no cervical adenopathy.  Neurological: She is oriented to person, place, and time. She exhibits normal muscle tone. Coordination normal.  Skin: No rash noted. No erythema.    Psychiatric: She has a normal mood and affect. Her behavior is normal.     ED Treatments / Results  Labs (all labs ordered are listed, but only abnormal results are displayed) Labs Reviewed  CBC WITH DIFFERENTIAL/PLATELET - Abnormal; Notable for the following:       Result Value   WBC 13.1 (*)    RBC 3.81 (*)    HCT 35.3 (*)    Neutro Abs 10.7 (*)    Monocytes Absolute 1.4 (*)    All other components within normal limits  COMPREHENSIVE METABOLIC PANEL - Abnormal; Notable for the following:    Sodium 133 (*)    Chloride 98 (*)    Glucose, Bld 117 (*)    BUN 28 (*)    Creatinine, Ser 1.24 (*)    Albumin 3.4 (*)    ALT 12 (*)    GFR calc non Af Amer 38 (*)    GFR calc Af Amer 44 (*)    All other components within normal limits  URINALYSIS, ROUTINE W REFLEX MICROSCOPIC - Abnormal; Notable for the following:    APPearance HAZY (*)    Leukocytes, UA LARGE (*)    Bacteria, UA FEW (*)    Squamous Epithelial / LPF 0-5 (*)    All other components within normal limits  URINE CULTURE  I-STAT CG4 LACTIC ACID, ED    EKG  EKG Interpretation None       Radiology Ct Head Wo Contrast  Result Date: 08/25/2016 CLINICAL DATA:  Nontraumatic BILATERAL neck pain for 1 week, difficulty standing, history breast cancer, atrial fibrillation EXAM: CT HEAD WITHOUT CONTRAST CT CERVICAL SPINE WITHOUT CONTRAST TECHNIQUE: Multidetector CT imaging of the head and cervical spine was performed following the standard protocol without intravenous contrast. Multiplanar CT image reconstructions of the cervical spine were also generated. COMPARISON:  05/12/2015 FINDINGS: CT HEAD FINDINGS Brain: Generalized atrophy. Normal ventricular morphology. No midline shift or mass effect. Small vessel chronic ischemic changes of deep cerebral white matter. 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 No intracranial hemorrhage, mass lesion, or evidence acute infarction. No extra-axial fluid collections.  Vascular: Minimal atherosclerotic calcification of internal carotid arteries at skullbase Skull: Intact Sinuses/Orbits: Clear Other: N/A CT CERVICAL SPINE FINDINGS Alignment: Anterolistheses at C3-C4 and C4-C5 appear unchanged. Remaining alignments normal. Osseous: Diffuse did  osseous demineralization. Visualized skullbase intact. Incomplete posterior arch C1, developmental anomaly. Vertebral body heights maintained without fracture or bone destruction. Multilevel facet degenerative changes. Soft tissues and spinal canal: Prevertebral soft tissues normal thickness. Atherosclerotic calcifications at the carotid bifurcations bilaterally, proximal great vessels, and aortic arch. Disc levels: Disc space narrowing and endplate spur formation at C4-C5, C5-C6 and C6-C7. Encroachment upon BILATERAL cervical neural foramina at C5-C6 and C6-C7 as well as RIGHT C4-C5. Odontoid process approximates the RIGHT lateral mass of C1 unchanged. Upper chest: Question RIGHT apex lung nodule versus developing scarring 12 x 10 mm. Other: N/A IMPRESSION: Atrophy with small vessel chronic ischemic changes of deep cerebral white matter. 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 No acute intracranial abnormalities. Multilevel degenerative disc and facet disease changes of the cervical spine as above with chronic anterolisthesis at C4-C5 and C3-C4. No acute cervical spine abnormalities. Question new 12 x 10 mm nodule at RIGHT lung apex versus developing scarring recommend short-term follow-up CT chest to assess. Electronically Signed   By: Lavonia Dana M.D.   On: 08/25/2016 13:11   Ct Cervical Spine Wo Contrast  Result Date: 08/25/2016 CLINICAL DATA:  Nontraumatic BILATERAL neck pain for 1 week, difficulty standing, history breast cancer, atrial fibrillation EXAM: CT HEAD WITHOUT CONTRAST CT CERVICAL SPINE WITHOUT CONTRAST TECHNIQUE: Multidetector CT imaging of the head and cervical spine was performed following the standard  protocol without intravenous contrast. Multiplanar CT image reconstructions of the cervical spine were also generated. COMPARISON:  05/12/2015 FINDINGS: CT HEAD FINDINGS Brain: Generalized atrophy. Normal ventricular morphology. No midline shift or mass effect. Small vessel chronic ischemic changes of deep cerebral white matter. 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 No intracranial hemorrhage, mass lesion, or evidence acute infarction. No extra-axial fluid collections. Vascular: Minimal atherosclerotic calcification of internal carotid arteries at skullbase Skull: Intact Sinuses/Orbits: Clear Other: N/A CT CERVICAL SPINE FINDINGS Alignment: Anterolistheses at C3-C4 and C4-C5 appear unchanged. Remaining alignments normal. Osseous: Diffuse did osseous demineralization. Visualized skullbase intact. Incomplete posterior arch C1, developmental anomaly. Vertebral body heights maintained without fracture or bone destruction. Multilevel facet degenerative changes. Soft tissues and spinal canal: Prevertebral soft tissues normal thickness. Atherosclerotic calcifications at the carotid bifurcations bilaterally, proximal great vessels, and aortic arch. Disc levels: Disc space narrowing and endplate spur formation at C4-C5, C5-C6 and C6-C7. Encroachment upon BILATERAL cervical neural foramina at C5-C6 and C6-C7 as well as RIGHT C4-C5. Odontoid process approximates the RIGHT lateral mass of C1 unchanged. Upper chest: Question RIGHT apex lung nodule versus developing scarring 12 x 10 mm. Other: N/A IMPRESSION: Atrophy with small vessel chronic ischemic changes of deep cerebral white matter. 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 No acute intracranial abnormalities. Multilevel degenerative disc and facet disease changes of the cervical spine as above with chronic anterolisthesis at C4-C5 and C3-C4. No acute cervical spine abnormalities. Question new 12 x 10 mm nodule at RIGHT lung apex versus  developing scarring recommend short-term follow-up CT chest to assess. Electronically Signed   By: Lavonia Dana M.D.   On: 08/25/2016 13:11    Procedures Procedures (including critical care time)  Medications Ordered in ED Medications  cephALEXin (KEFLEX) capsule 500 mg (not administered)  sodium chloride 0.9 % bolus 1,000 mL (1,000 mLs Intravenous New Bag/Given 08/25/16 1145)  oxyCODONE-acetaminophen (PERCOCET/ROXICET) 5-325 MG per tablet 1 tablet (1 tablet Oral Given 08/25/16 1147)     Initial Impression / Assessment and Plan / ED Course  I have reviewed the triage vital signs  and the nursing notes.  Pertinent labs & imaging results that were available during my care of the patient were reviewed by me and considered in my medical decision making (see chart for details).     Labs and x-rays unremarkable except for urinary tract infection. Patient improved with pain medicine. Suspect soft tissue inflammation in left lateral neck. Most likely muscle strain. Patient will be put on Ultram and Keflex will follow-up with PCP  Final Clinical Impressions(s) / ED Diagnoses   Final diagnoses:  Neck pain  Acute cystitis without hematuria    New Prescriptions New Prescriptions   CEPHALEXIN (KEFLEX) 500 MG CAPSULE    Take 1 capsule (500 mg total) by mouth 4 (four) times daily.   TRAMADOL (ULTRAM) 50 MG TABLET    Take 1 tablet (50 mg total) by mouth every 6 (six) hours as needed.     Milton Ferguson, MD 08/25/16 [phone]

## 2016-08-25 NOTE — Discharge Instructions (Signed)
Follow-up with your doctor next week. Take the Ultram for pain in the Keflex for urinary tract infection

## 2016-08-26 LAB — URINE CULTURE

## 2016-09-05 ENCOUNTER — Emergency Department (HOSPITAL_COMMUNITY): Payer: Medicare Other

## 2016-09-05 ENCOUNTER — Observation Stay (HOSPITAL_COMMUNITY)
Admission: EM | Admit: 2016-09-05 | Discharge: 2016-09-07 | Disposition: A | Discharge status: Skilled Nursing Facility | Payer: Medicare Other | Attending: Internal Medicine | Admitting: Internal Medicine

## 2016-09-05 ENCOUNTER — Encounter (HOSPITAL_COMMUNITY): Payer: Self-pay | Admitting: Emergency Medicine

## 2016-09-05 DIAGNOSIS — F419 Anxiety disorder, unspecified: Secondary | ICD-10-CM | POA: Insufficient documentation

## 2016-09-05 DIAGNOSIS — I5032 Chronic diastolic (congestive) heart failure: Secondary | ICD-10-CM | POA: Diagnosis present

## 2016-09-05 DIAGNOSIS — R002 Palpitations: Secondary | ICD-10-CM | POA: Diagnosis not present

## 2016-09-05 DIAGNOSIS — Z87891 Personal history of nicotine dependence: Secondary | ICD-10-CM | POA: Insufficient documentation

## 2016-09-05 DIAGNOSIS — I130 Hypertensive heart and chronic kidney disease with heart failure and stage 1 through stage 4 chronic kidney disease, or unspecified chronic kidney disease: Secondary | ICD-10-CM | POA: Insufficient documentation

## 2016-09-05 DIAGNOSIS — R531 Weakness: Secondary | ICD-10-CM | POA: Diagnosis present

## 2016-09-05 DIAGNOSIS — Z887 Allergy status to serum and vaccine status: Secondary | ICD-10-CM | POA: Insufficient documentation

## 2016-09-05 DIAGNOSIS — I4891 Unspecified atrial fibrillation: Secondary | ICD-10-CM | POA: Diagnosis present

## 2016-09-05 DIAGNOSIS — Z79899 Other long term (current) drug therapy: Secondary | ICD-10-CM | POA: Insufficient documentation

## 2016-09-05 DIAGNOSIS — Z66 Do not resuscitate: Secondary | ICD-10-CM | POA: Insufficient documentation

## 2016-09-05 DIAGNOSIS — I700 Atherosclerosis of aorta: Secondary | ICD-10-CM | POA: Diagnosis not present

## 2016-09-05 DIAGNOSIS — I481 Persistent atrial fibrillation: Secondary | ICD-10-CM | POA: Insufficient documentation

## 2016-09-05 DIAGNOSIS — E782 Mixed hyperlipidemia: Secondary | ICD-10-CM | POA: Diagnosis not present

## 2016-09-05 DIAGNOSIS — Z853 Personal history of malignant neoplasm of breast: Secondary | ICD-10-CM | POA: Insufficient documentation

## 2016-09-05 DIAGNOSIS — I10 Essential (primary) hypertension: Secondary | ICD-10-CM | POA: Diagnosis present

## 2016-09-05 DIAGNOSIS — Z880 Allergy status to penicillin: Secondary | ICD-10-CM | POA: Diagnosis not present

## 2016-09-05 DIAGNOSIS — E860 Dehydration: Secondary | ICD-10-CM | POA: Insufficient documentation

## 2016-09-05 DIAGNOSIS — Z888 Allergy status to other drugs, medicaments and biological substances status: Secondary | ICD-10-CM | POA: Insufficient documentation

## 2016-09-05 DIAGNOSIS — N189 Chronic kidney disease, unspecified: Secondary | ICD-10-CM | POA: Diagnosis not present

## 2016-09-05 DIAGNOSIS — N179 Acute kidney failure, unspecified: Secondary | ICD-10-CM | POA: Insufficient documentation

## 2016-09-05 DIAGNOSIS — K921 Melena: Secondary | ICD-10-CM | POA: Insufficient documentation

## 2016-09-05 DIAGNOSIS — Z7901 Long term (current) use of anticoagulants: Secondary | ICD-10-CM | POA: Insufficient documentation

## 2016-09-05 DIAGNOSIS — R0902 Hypoxemia: Secondary | ICD-10-CM

## 2016-09-05 DIAGNOSIS — I5033 Acute on chronic diastolic (congestive) heart failure: Secondary | ICD-10-CM | POA: Insufficient documentation

## 2016-09-05 LAB — URINALYSIS, ROUTINE W REFLEX MICROSCOPIC
BILIRUBIN URINE: NEGATIVE
GLUCOSE, UA: NEGATIVE mg/dL
KETONES UR: NEGATIVE mg/dL
NITRITE: NEGATIVE
PH: 5 (ref 5.0–8.0)
Protein, ur: NEGATIVE mg/dL
Specific Gravity, Urine: 1.01 (ref 1.005–1.030)

## 2016-09-05 LAB — BASIC METABOLIC PANEL
ANION GAP: 12 (ref 5–15)
BUN: 28 mg/dL — ABNORMAL HIGH (ref 6–20)
CALCIUM: 10.9 mg/dL — AB (ref 8.9–10.3)
CO2: 26 mmol/L (ref 22–32)
Chloride: 94 mmol/L — ABNORMAL LOW (ref 101–111)
Creatinine, Ser: 1.4 mg/dL — ABNORMAL HIGH (ref 0.44–1.00)
GFR calc non Af Amer: 33 mL/min — ABNORMAL LOW (ref >60)
GFR, EST AFRICAN AMERICAN: 38 mL/min — AB (ref >60)
Glucose, Bld: 147 mg/dL — ABNORMAL HIGH (ref 65–99)
POTASSIUM: 4.5 mmol/L (ref 3.5–5.1)
Sodium: 132 mmol/L — ABNORMAL LOW (ref 135–145)

## 2016-09-05 LAB — CBC
HCT: 38.1 % (ref 36.0–46.0)
HEMOGLOBIN: 12.8 g/dL (ref 12.0–15.0)
MCH: 31 pg (ref 26.0–34.0)
MCHC: 33.6 g/dL (ref 30.0–36.0)
MCV: 92.3 fL (ref 78.0–100.0)
Platelets: 320 10*3/uL (ref 150–400)
RBC: 4.13 MIL/uL (ref 3.87–5.11)
RDW: 14.7 % (ref 11.5–15.5)
WBC: 11.8 10*3/uL — ABNORMAL HIGH (ref 4.0–10.5)

## 2016-09-05 LAB — BRAIN NATRIURETIC PEPTIDE: B Natriuretic Peptide: 181.6 pg/mL — ABNORMAL HIGH (ref 0.0–100.0)

## 2016-09-05 LAB — I-STAT TROPONIN, ED: TROPONIN I, POC: 0.02 ng/mL (ref 0.00–0.08)

## 2016-09-05 MED ORDER — ONDANSETRON HCL 4 MG/2ML IJ SOLN
4.0000 mg | Freq: Once | INTRAMUSCULAR | Status: AC
  Administered 2016-09-05: 4 mg via INTRAVENOUS
  Filled 2016-09-05: qty 2

## 2016-09-05 MED ORDER — MORPHINE SULFATE (PF) 2 MG/ML IV SOLN
4.0000 mg | Freq: Once | INTRAVENOUS | Status: AC
  Administered 2016-09-05: 2 mg via INTRAMUSCULAR

## 2016-09-05 MED ORDER — METOPROLOL TARTRATE 5 MG/5ML IV SOLN
5.0000 mg | Freq: Once | INTRAVENOUS | Status: AC
  Administered 2016-09-05: 5 mg via INTRAVENOUS
  Filled 2016-09-05: qty 5

## 2016-09-05 MED ORDER — MORPHINE SULFATE (PF) 2 MG/ML IV SOLN
4.0000 mg | Freq: Once | INTRAVENOUS | Status: DC
Start: 2016-09-05 — End: 2016-09-05
  Filled 2016-09-05: qty 2

## 2016-09-05 MED ORDER — FUROSEMIDE 10 MG/ML IJ SOLN
40.0000 mg | Freq: Once | INTRAMUSCULAR | Status: AC
  Administered 2016-09-05: 40 mg via INTRAVENOUS
  Filled 2016-09-05: qty 4

## 2016-09-05 MED ORDER — SODIUM CHLORIDE 0.9 % IV BOLUS (SEPSIS)
500.0000 mL | Freq: Once | INTRAVENOUS | Status: AC
  Administered 2016-09-05: 500 mL via INTRAVENOUS

## 2016-09-05 NOTE — ED Notes (Signed)
Nurse attempting to ambulate pt, pt states "My legs cannot hold me up. I cannot walk on them."

## 2016-09-05 NOTE — ED Provider Notes (Signed)
[facility] DEPT Provider Note   CSN: [id number] Arrival date & time: 09/05/16  1249     History   Chief Complaint Chief Complaint  Patient presents with  . Weakness    HPI Alexandra Luna is a 83 y.o. female with history of hypertension, A. fib recently diagnosed with a UTI on 08/25/2016 who presents today for weakness. The patient states that since receiving antibiotics for her UTI Alexandra Luna began feeling "woozy, strange, nauseous". Alexandra Luna was seen by Dr. Fredderick Phenix at her assisted living at the [facility] where he "ran test on my heart". Alexandra Luna states that he switch her antibiotic and increased her dose of Lasix. Alexandra Luna says that Alexandra Luna finished her course of antibiotics on Sunday and was feeling better. On Tuesday the patient notes that Alexandra Luna began feeling "weak". Alexandra Luna notes that with her physical therapist Alexandra Luna was unable to walk greater than 30 seconds due to physical weakness and become more short of breath than normal. Alexandra Luna states 2 weeks prior Alexandra Luna was able to walk 2 minutes without stopping or becoming short of breath or weak. The patient states that yesterday Alexandra Luna did not walk at all, having to take a motor scooter to all of her meals in the dining hall. Today Alexandra Luna feels like Alexandra Luna is unable to stand due to weakness and is feeling constant nausea. Alexandra Luna was seen by her nurse at the assisted living advised to go to the emergency department for evaluation. The patient denies fever, chills, dizziness, syncope, seizures, facial droop, slurred words, palpitations, chest pain, shortness of breath at rest, cough, abdominal pain, vomiting, diarrhea, increasing urinary frequency, hematuria, melena, hematochezia, leg swelling.  HPI  Past Medical History:  Diagnosis Date  . Anxiety   . Atrial fibrillation (HCC)    On chronic Eliquis  . Breast cancer ([place]) dx'd 1997   left; lt lumpectomy and XRT  . Mixed hyperlipidemia   . Palpitations   . Pre-diabetes   . Renal hypertension 03/07/2005   no evidence of significant diameter  reduction, dissection, tortuosity, FMD or orther vascular abnormality; kidneys equal in size, symmetry, with normal cortex and medulla    Patient Active Problem List   Diagnosis Date Noted  . Weakness 09/06/2016  . Chronic diastolic CHF (congestive heart failure) ([place]) 09/06/2016  . ARF (acute renal failure) ([place]) 09/06/2016  . Weakness generalized 09/06/2016  . COPD exacerbation ([place]) 04/01/2015  . Bilateral pleural effusion 08/30/2014  . Multiple pulmonary nodules 08/30/2014  . Acute diastolic congestive heart failure ([place]) 08/14/2014  . Acute respiratory failure with hypoxia ([place]) 08/14/2014  . Generalized weakness 06/28/2014  . Chronic anticoagulation 12/08/2012  . AF (atrial fibrillation) ([place]) 09/22/2012  . Essential hypertension 09/22/2012  . Hyperlipidemia, mixed 09/22/2012  . History of breast cancer in female 04/28/2012    Past Surgical History:  Procedure Laterality Date  . BREAST LUMPECTOMY    . BREAST LUMPECTOMY  1998   left side  . CARDIOVASCULAR STRESS TEST  526/2011   R/Dipyridamole - EF 71%; normal myocaridal perfusion w/o evidence of ischemia or infarct; no schintigraphic evidence of inducible myocaridal ischemia; global LV function normal; EKG negative for ischemia; low risk scan   . CARDIOVERSION N/A 11/12/2012   Procedure: CARDIOVERSION;  Surgeon: Troy Sine, MD;  Location: [facility];  Service: Cardiovascular;  Laterality: N/A;  . CESAREAN SECTION     x 3  . DOPPLER ECHOCARDIOGRAPHY  08/29/2011   EF >77%;  LV systolic fcn normal; doppler flow pattern suggestive of impaired LV relaxation; LA  mildly dilated; aortic valve mildly sclerotic, no aortic stenosis  . tonsillectomy      OB History    No data available       Home Medications    Prior to Admission medications   Medication Sig Start Date End Date Taking? Authorizing Provider  acetaminophen (TYLENOL) 325 MG tablet Take 650 mg by mouth 2 (two) times daily as needed for mild pain.   Yes  [provider]  albuterol (PROVENTIL HFA;VENTOLIN HFA) 108 (90 Base) MCG/ACT inhaler Inhale 1 puff into the lungs every 4 (four) hours as needed for wheezing or shortness of breath. 04/03/15  Yes Nita Sells, MD  apixaban (ELIQUIS) 2.5 MG TABS tablet Take 1 tablet (2.5 mg total) by mouth 2 (two) times daily. 12/23/13  Yes Troy Sine, MD  atenolol (TENORMIN) 25 MG tablet Take 1 tablet (25 mg total) by mouth at bedtime. 07/01/14  Yes Robbie Lis, MD  calcium carbonate (OS-CAL) 600 MG TABS Take 600 mg by mouth daily.    Yes [provider]  fluticasone (FLONASE) 50 MCG/ACT nasal spray Place 1 spray into both nostrils at bedtime.  07/04/12  Yes [provider]  furosemide (LASIX) 20 MG tablet Take 1 tablet (20 mg total) by mouth daily. 08/15/14  Yes Robbie Lis, MD  Multiple Vitamins-Iron (MULTIVITAMINS WITH IRON) TABS tablet Take 1 tablet by mouth daily.   Yes [provider]  rosuvastatin (CRESTOR) 10 MG tablet Take 1 tablet (10 mg total) by mouth every other day. 05/10/14  Yes Troy Sine, MD  spironolactone (ALDACTONE) 25 MG tablet Take 1 tablet by mouth daily. 02/17/15  Yes [provider]  sulfamethoxazole-trimethoprim (BACTRIM DS,SEPTRA DS) 800-160 MG tablet Take 1 tablet by mouth 2 (two) times daily.   Yes [provider]  traMADol (ULTRAM) 50 MG tablet Take 1 tablet (50 mg total) by mouth every 6 (six) hours as needed. 08/25/16  Yes Milton Ferguson, MD  valsartan (DIOVAN) 40 MG tablet Take 1 tablet (40 mg total) by mouth daily. Patient taking differently: Take 20 mg by mouth daily.  02/15/14  Yes Troy Sine, MD  verapamil (VERELAN PM) 240 MG 24 hr capsule Take 1 capsule (240 mg total) by mouth at bedtime. 05/03/14  Yes Troy Sine, MD  cephALEXin (KEFLEX) 500 MG capsule Take 1 capsule (500 mg total) by mouth 4 (four) times daily. Patient not taking: Reported on 09/05/2016 08/25/16   Milton Ferguson, MD  feeding supplement,  ENSURE ENLIVE, (ENSURE ENLIVE) LIQD Take 237 mLs by mouth 2 (two) times daily between meals. Patient not taking: Reported on 09/05/2016 07/01/14   Robbie Lis, MD  metoCLOPramide (REGLAN) 10 MG tablet Take 1 tablet (10 mg total) by mouth every 8 (eight) hours as needed for nausea. Patient not taking: Reported on 09/05/2016 03/21/15   Harvel Quale, MD  ondansetron (ZOFRAN) 4 MG tablet Take 1 tablet (4 mg total) by mouth every 6 (six) hours as needed for nausea. Patient not taking: Reported on 05/12/2015 08/15/14   Robbie Lis, MD  predniSONE (DELTASONE) 20 MG tablet Take 3 tablets (60 mg total) by mouth daily before breakfast. Patient not taking: Reported on 05/12/2015 04/03/15   Nita Sells, MD  Spacer/Aero-Holding Chambers (AEROCHAMBER PLUS FLO-VU MEDIUM) MISC 1 each by Other route once. 04/03/15   Nita Sells, MD    Family History Family History  Problem Relation Age of Onset  . Heart failure Mother   . Breast cancer Mother   .  Hypertension Father   . Kidney cancer Father   . Stroke Maternal Grandmother     Social History Social History  Substance Use Topics  . Smoking status: Former Smoker    Packs/day: 0.75    Years: 10.00    Types: Cigarettes    Quit date: 04/02/1988  . Smokeless tobacco: Never Used  . Alcohol use 0.0 oz/week     Comment: 1/4 glass of wine every 2 weeks     Allergies   Influenza vaccines; T[place]a [trandolapril-verapamil hcl er]; Adhesive [tape]; Azithromycin; Lipitor [atorvastatin]; and Penicillins   Review of Systems Review of Systems  All other systems reviewed and are negative.    Physical Exam Updated Vital Signs BP (!) 150/78 (BP Location: Right Arm)   Pulse 94   Temp 98.9 F (37.2 C) (Oral)   Resp 16   Ht 5\' 6"  (1.676 m)   Wt 87.2 kg (192 lb 3.9 oz)   SpO2 98%   BMI 31.03 kg/m   Physical Exam  Constitutional: Alexandra Luna appears well-developed and well-nourished.  HENT:  Head: Normocephalic and atraumatic.  Right Ear:  Hearing and external ear normal.  Left Ear: Hearing and external ear normal.  Nose: Nose normal. No rhinorrhea. Right sinus exhibits no maxillary sinus tenderness and no frontal sinus tenderness. Left sinus exhibits no maxillary sinus tenderness and no frontal sinus tenderness.  Mouth/Throat: Uvula is midline, oropharynx is clear and moist and mucous membranes are normal.  Eyes: Conjunctivae, EOM and lids are normal. Pupils are equal, round, and reactive to light. Right eye exhibits no discharge. Left eye exhibits no discharge. Right conjunctiva is not injected. Left conjunctiva is not injected. No scleral icterus. Right eye exhibits no nystagmus. Left eye exhibits no nystagmus.  Neck: Normal range of motion and full passive range of motion without pain. Neck supple. No spinous process tenderness and no muscular tenderness present. No neck rigidity. Normal range of motion present.  Cardiovascular: Normal heart sounds and intact distal pulses.  An irregularly irregular rhythm present. Tachycardia present.   Right leg > size then left leg. Trace edema  Pulmonary/Chest: Effort normal. No respiratory distress. Alexandra Luna has wheezes in the right lower field.  Abdominal: Soft. Bowel sounds are normal. There is no tenderness. There is no CVA tenderness.  Musculoskeletal:  Grip strength, upper and lower strength able and appropriate for age b/l. Grossly moves all 4 extremities without pain.   Neurological: Alexandra Luna is alert. Alexandra Luna has normal strength and normal reflexes. No cranial nerve deficit or sensory deficit.  Reflex Scores:      Bicep reflexes are 2+ on the right side and 2+ on the left side.      Patellar reflexes are 2+ on the right side and 2+ on the left side.      Achilles reflexes are 2+ on the right side and 2+ on the left side. Negative pronator drift.   Skin: Skin is warm and dry. No pallor.  Psychiatric: Alexandra Luna has a normal mood and affect. Her speech is normal.  Nursing note and vitals  reviewed.    ED Treatments / Results  Labs (all labs ordered are listed, but only abnormal results are displayed) Labs Reviewed  BASIC METABOLIC PANEL - Abnormal; Notable for the following:       Result Value   Sodium 132 (*)    Chloride 94 (*)    Glucose, Bld 147 (*)    BUN 28 (*)    Creatinine, Ser 1.40 (*)    Calcium  10.9 (*)    GFR calc non Af Amer 33 (*)    GFR calc Af Amer 38 (*)    All other components within normal limits  CBC - Abnormal; Notable for the following:    WBC 11.8 (*)    All other components within normal limits  URINALYSIS, ROUTINE W REFLEX MICROSCOPIC - Abnormal; Notable for the following:    APPearance HAZY (*)    Hgb urine dipstick LARGE (*)    Leukocytes, UA MODERATE (*)    Bacteria, UA RARE (*)    Squamous Epithelial / LPF 0-5 (*)    All other components within normal limits  BRAIN NATRIURETIC PEPTIDE - Abnormal; Notable for the following:    B Natriuretic Peptide 181.6 (*)    All other components within normal limits  HEPATIC FUNCTION PANEL - Abnormal; Notable for the following:    Total Bilirubin 0.2 (*)    Bilirubin, Direct <0.1 (*)    All other components within normal limits  LACTIC ACID, PLASMA - Abnormal; Notable for the following:    Lactic Acid, Venous 3.6 (*)    All other components within normal limits  URINE CULTURE  MRSA PCR SCREENING  TROPONIN I  LACTIC ACID, PLASMA  TSH  CORTISOL  BASIC METABOLIC PANEL  CBC  CBG MONITORING, ED  I-STAT TROPOININ, ED    EKG  EKG Interpretation None       Radiology Dg Chest 2 View  Result Date: 09/05/2016 CLINICAL DATA:  Weakness EXAM: CHEST  2 VIEW COMPARISON:  04/01/2015 FINDINGS: No focal consolidation or pleural effusion. Mild to moderate cardiomegaly unchanged. Aortic atherosclerosis. No pneumothorax. Surgical clips in the left axilla. IMPRESSION: Cardiomegaly.  No edema or infiltrate. Electronically Signed   By: Donavan Foil M.D.   On: 09/05/2016 20:00     Procedures Procedures (including critical care time)  Medications Ordered in ED Medications  traMADol (ULTRAM) tablet 50 mg (not administered)  albuterol (PROVENTIL HFA;VENTOLIN HFA) 108 (90 Base) MCG/ACT inhaler 1 puff (not administered)  multivitamins with iron tablet 1 tablet (not administered)  atenolol (TENORMIN) tablet 25 mg (not administered)  rosuvastatin (CRESTOR) tablet 10 mg (not administered)  verapamil (VERELAN PM) 24 hr capsule CP24 240 mg (not administered)  apixaban (ELIQUIS) tablet 2.5 mg (not administered)  fluticasone (FLONASE) 50 MCG/ACT nasal spray 1 spray (not administered)  calcium carbonate (OS-CAL) tablet 600 mg (not administered)  acetaminophen (TYLENOL) tablet 650 mg (not administered)    Or  acetaminophen (TYLENOL) suppository 650 mg (not administered)  metoprolol tartrate (LOPRESSOR) injection 5 mg (5 mg Intravenous Given 09/05/16 2210)  ondansetron (ZOFRAN) injection 4 mg (4 mg Intravenous Given 09/05/16 2210)  furosemide (LASIX) injection 40 mg (40 mg Intravenous Given 09/05/16 2210)  morphine 2 MG/ML injection 4 mg (2 mg Intramuscular Given 09/05/16 2147)  sodium chloride 0.9 % bolus 500 mL (500 mLs Intravenous New Bag/Given 09/05/16 2342)  0.9 %  sodium chloride infusion ( Intravenous New Bag/Given 09/06/16 0054)     Initial Impression / Assessment and Plan / ED Course  I have reviewed the triage vital signs and the nursing notes.  Pertinent labs & imaging results that were available during my care of the patient were reviewed by me and considered in my medical decision making (see chart for details).     This is a 83 y.o. Female recently diagnosed with a UTI on 08/25/2016 who presents today for weakness. On presentation her HR elevated at 101 bpm, BP 158/76, vitals otherwise unremarkable. Was  seen by assisted living doctor who told her symptoms due to "heart" and increased her dose of lasix. On exam the patient has irregualrly irregular cardiac exam which  is consistent with history of a-fib which Alexandra Luna is on Eliquis for; there is RLL wheezing; right lower extremity > LLE with trace edema. The patient neuro exam is unremarkable.   Will obtain a UA in order to see if UTI has resolved.   EKG obtained for tachycardia on presentation. Shows a-fib consistent with past EKG's. Wheezing on exam with obtain CXR.   CBC, BMP ordered.  As elderly patients present atypically will order Tn to investigate for cardiac etiology. With history of CHF, leg edema, DOE will get BNP to investigate for CHF.    Ambulate with pulse ox which shows sats dropping to 89-91% during ambulation.   Na and Cl low. BUN and Cr elevated, consistent with AKI. WBC shows leukocytosis. CBC otherwise unremarkable.   Tn negative at 0.02.   BNP elevated. CXR with cardiomeagly. Correlates with CHF excerebration picture.   Patient seen and discussed in collaboration with Will Dansie, PA-C who called for admission. Patient was admitted to floor on telemetry.    Final Clinical Impressions(s) / ED Diagnoses   Final diagnoses:  Weakness  Generalized weakness  Hypoxia    New Prescriptions Current Discharge Medication List       Lorelle Gibbs 09/06/16 0092    Isla Pence, MD 09/10/16 1102

## 2016-09-05 NOTE — ED Notes (Signed)
IV Team at bedside 

## 2016-09-05 NOTE — ED Triage Notes (Signed)
Pt with weakness that has been worsening since being diagnosed with UTI 08/22/2016. Pt states she had finished antibiotic 4 days ago and continues to feel weak with nausea. Pt unable to do PT at [facility].

## 2016-09-05 NOTE — ED Notes (Signed)
Pt got a urine sample but poured it out before giving it to Probation officer.

## 2016-09-05 NOTE — ED Notes (Signed)
Pt ambulatory in hall Pt's O2 saturation between 89-91%

## 2016-09-06 ENCOUNTER — Encounter (HOSPITAL_COMMUNITY): Payer: Self-pay | Admitting: Internal Medicine

## 2016-09-06 DIAGNOSIS — R531 Weakness: Secondary | ICD-10-CM

## 2016-09-06 DIAGNOSIS — I5032 Chronic diastolic (congestive) heart failure: Secondary | ICD-10-CM | POA: Diagnosis not present

## 2016-09-06 DIAGNOSIS — N179 Acute kidney failure, unspecified: Secondary | ICD-10-CM | POA: Diagnosis present

## 2016-09-06 DIAGNOSIS — I10 Essential (primary) hypertension: Secondary | ICD-10-CM | POA: Diagnosis not present

## 2016-09-06 DIAGNOSIS — I481 Persistent atrial fibrillation: Secondary | ICD-10-CM | POA: Diagnosis not present

## 2016-09-06 DIAGNOSIS — E860 Dehydration: Secondary | ICD-10-CM | POA: Diagnosis not present

## 2016-09-06 LAB — CBC
HCT: 34.1 % — ABNORMAL LOW (ref 36.0–46.0)
HEMOGLOBIN: 11.5 g/dL — AB (ref 12.0–15.0)
MCH: 30.6 pg (ref 26.0–34.0)
MCHC: 33.7 g/dL (ref 30.0–36.0)
MCV: 90.7 fL (ref 78.0–100.0)
Platelets: 296 10*3/uL (ref 150–400)
RBC: 3.76 MIL/uL — AB (ref 3.87–5.11)
RDW: 14.7 % (ref 11.5–15.5)
WBC: 12.2 10*3/uL — AB (ref 4.0–10.5)

## 2016-09-06 LAB — BASIC METABOLIC PANEL
ANION GAP: 10 (ref 5–15)
BUN: 28 mg/dL — ABNORMAL HIGH (ref 6–20)
CHLORIDE: 97 mmol/L — AB (ref 101–111)
CO2: 25 mmol/L (ref 22–32)
Calcium: 9.9 mg/dL (ref 8.9–10.3)
Creatinine, Ser: 1.09 mg/dL — ABNORMAL HIGH (ref 0.44–1.00)
GFR calc Af Amer: 51 mL/min — ABNORMAL LOW (ref >60)
GFR calc non Af Amer: 44 mL/min — ABNORMAL LOW (ref >60)
Glucose, Bld: 120 mg/dL — ABNORMAL HIGH (ref 65–99)
Potassium: 4.3 mmol/L (ref 3.5–5.1)
Sodium: 132 mmol/L — ABNORMAL LOW (ref 135–145)

## 2016-09-06 LAB — TSH: TSH: 2.537 u[IU]/mL (ref 0.350–4.500)

## 2016-09-06 LAB — HEPATIC FUNCTION PANEL
ALT: 20 U/L (ref 14–54)
AST: 28 U/L (ref 15–41)
Albumin: 3.8 g/dL (ref 3.5–5.0)
Alkaline Phosphatase: 90 U/L (ref 38–126)
BILIRUBIN TOTAL: 0.2 mg/dL — AB (ref 0.3–1.2)
Total Protein: 7.1 g/dL (ref 6.5–8.1)

## 2016-09-06 LAB — LACTIC ACID, PLASMA
LACTIC ACID, VENOUS: 3.6 mmol/L — AB (ref 0.5–1.9)
Lactic Acid, Venous: 1.4 mmol/L (ref 0.5–1.9)

## 2016-09-06 LAB — CORTISOL: Cortisol, Plasma: 16.4 ug/dL

## 2016-09-06 LAB — MRSA PCR SCREENING: MRSA BY PCR: NEGATIVE

## 2016-09-06 LAB — TROPONIN I: Troponin I: 0.03 ng/mL (ref <0.03)

## 2016-09-06 MED ORDER — ACETAMINOPHEN 325 MG PO TABS: 650.0000 mg | ORAL_TABLET | Freq: Four times a day (QID) | ORAL | Status: DC | PRN

## 2016-09-06 MED ORDER — APIXABAN 2.5 MG PO TABS
2.5000 mg | ORAL_TABLET | Freq: Two times a day (BID) | ORAL | Status: DC
  Administered 2016-09-06 – 2016-09-07 (×4): 2.5 mg via ORAL
  Filled 2016-09-06 (×4): qty 1

## 2016-09-06 MED ORDER — ATENOLOL 25 MG PO TABS
25.0000 mg | ORAL_TABLET | Freq: Every day | ORAL | Status: DC
  Administered 2016-09-06 (×2): 25 mg via ORAL
  Filled 2016-09-06 (×2): qty 1

## 2016-09-06 MED ORDER — CALCIUM CARBONATE 1250 (500 CA) MG PO TABS
1250.0000 mg | ORAL_TABLET | Freq: Every day | ORAL | Status: DC
  Administered 2016-09-06 – 2016-09-07 (×2): 1250 mg via ORAL
  Filled 2016-09-06 (×2): qty 1

## 2016-09-06 MED ORDER — TRAMADOL HCL 50 MG PO TABS
50.0000 mg | ORAL_TABLET | Freq: Four times a day (QID) | ORAL | Status: DC | PRN
  Administered 2016-09-06 (×2): 50 mg via ORAL
  Filled 2016-09-06 (×2): qty 1

## 2016-09-06 MED ORDER — VERAPAMIL HCL ER 240 MG PO TBCR
240.0000 mg | EXTENDED_RELEASE_TABLET | Freq: Every day | ORAL | Status: DC
  Administered 2016-09-06 (×2): 240 mg via ORAL
  Filled 2016-09-06 (×2): qty 1

## 2016-09-06 MED ORDER — ADULT MULTIVITAMIN W/MINERALS CH
1.0000 | ORAL_TABLET | Freq: Every day | ORAL | Status: DC
  Administered 2016-09-06 – 2016-09-07 (×2): 1 via ORAL
  Filled 2016-09-06 (×2): qty 1

## 2016-09-06 MED ORDER — ROSUVASTATIN CALCIUM 10 MG PO TABS
10.0000 mg | ORAL_TABLET | ORAL | Status: DC
  Administered 2016-09-06: 10 mg via ORAL
  Filled 2016-09-06: qty 1

## 2016-09-06 MED ORDER — ALBUTEROL SULFATE HFA 108 (90 BASE) MCG/ACT IN AERS: 1.0000 | INHALATION_SPRAY | RESPIRATORY_TRACT | Status: DC | PRN

## 2016-09-06 MED ORDER — SODIUM CHLORIDE 0.9 % IV SOLN
INTRAVENOUS | Status: DC
  Administered 2016-09-06 – 2016-09-07 (×2): via INTRAVENOUS

## 2016-09-06 MED ORDER — FLUTICASONE PROPIONATE 50 MCG/ACT NA SUSP
1.0000 | Freq: Every day | NASAL | Status: DC
  Administered 2016-09-06 (×2): 1 via NASAL
  Filled 2016-09-06: qty 16

## 2016-09-06 MED ORDER — ALBUTEROL SULFATE (2.5 MG/3ML) 0.083% IN NEBU: 2.5000 mg | INHALATION_SOLUTION | RESPIRATORY_TRACT | Status: DC | PRN

## 2016-09-06 MED ORDER — HYDRALAZINE HCL 20 MG/ML IJ SOLN
10.0000 mg | INTRAMUSCULAR | Status: DC | PRN
  Filled 2016-09-06: qty 0.5

## 2016-09-06 MED ORDER — ACETAMINOPHEN 650 MG RE SUPP: 650.0000 mg | Freq: Four times a day (QID) | RECTAL | Status: DC | PRN

## 2016-09-06 MED ORDER — SODIUM CHLORIDE 0.9 % IV SOLN
Freq: Once | INTRAVENOUS | Status: AC
  Administered 2016-09-06: 01:00:00 via INTRAVENOUS

## 2016-09-06 NOTE — Evaluation (Signed)
Physical Therapy Evaluation Patient Details Name: Alexandra Luna MRN: [id number] DOB: 1927/02/02 Today's Date: 09/06/2016   History of Present Illness  83 yo female admitted with general weakness. Hx of Afib, CHF, HTN, anxiety, breast cancer, COPD.   Clinical Impression  On eval, pt required Min guard assist for mobility. She walked ~25 feet with a RW. Pt c/o feeling weak and shaky and declined to ambulate any farther. Discussed d/c plan-pt states she will return to [employer]. Recommend pt resume HHPT at ALF. Will follow and progress activity as tolerated.     Follow Up Recommendations Home health PT (at ALF)    Equipment Recommendations  None recommended by PT    Recommendations for Other Services       Precautions / Restrictions Precautions Precautions: Fall Restrictions Weight Bearing Restrictions: No      Mobility  Bed Mobility Overal bed mobility: Needs Assistance Bed Mobility: Supine to Sit     Supine to sit: Supervision     General bed mobility comments: for safety  Transfers Overall transfer level: Needs assistance Equipment used: Rolling walker (2 wheeled) Transfers: Sit to/from Stand Sit to Stand: Min guard         General transfer comment: close guard for safety.   Ambulation/Gait Ambulation/Gait assistance: Min guard Ambulation Distance (Feet): 25 Feet Assistive device: Rolling walker (2 wheeled) Gait Pattern/deviations: Step-through pattern;Decreased stride length     General Gait Details: slow gait speed. Pt c/o LEs feeling weak and shaky. She declined to ambulate any farther. Returned to pt's room at her request  Stairs            Wheelchair Mobility    Modified Rankin (Stroke Patients Only)       Balance Overall balance assessment: Needs assistance         Standing balance support: Bilateral upper extremity supported Standing balance-Leahy Scale: Poor Standing balance comment: requires RW                             Pertinent Vitals/Pain Pain Assessment: No/denies pain    Home Living Family/patient expects to be discharged to:: Assisted living [facility])               Home Equipment: Gilford Rile - 4 wheels      Prior Function Level of Independence: Needs assistance   Gait / Transfers Assistance Needed: mod I with Rollator     Comments: Participating with PT at ALF     Hand Dominance        Extremity/Trunk Assessment   Upper Extremity Assessment Upper Extremity Assessment: Generalized weakness    Lower Extremity Assessment Lower Extremity Assessment: Generalized weakness    Cervical / Trunk Assessment Cervical / Trunk Assessment: Kyphotic  Communication   Communication: No difficulties  Cognition Arousal/Alertness: Awake/alert Behavior During Therapy: WFL for tasks assessed/performed Overall Cognitive Status: Within Functional Limits for tasks assessed                                        General Comments      Exercises     Assessment/Plan    PT Assessment Patient needs continued PT services  PT Problem List Decreased strength;Decreased mobility;Decreased activity tolerance;Decreased balance;Decreased knowledge of use of DME       PT Treatment Interventions DME instruction;Gait training;Therapeutic activities;Therapeutic exercise;Patient/family education;Functional mobility training;Balance training  PT Goals (Current goals can be found in the Care Plan section)  Acute Rehab PT Goals Patient Stated Goal: return to ALF PT Goal Formulation: With patient Time For Goal Achievement: 09/20/16 Potential to Achieve Goals: Good    Frequency Min 3X/week   Barriers to discharge        Co-evaluation               AM-PAC PT "6 Clicks" Daily Activity  Outcome Measure Difficulty turning over in bed (including adjusting bedclothes, sheets and blankets)?: A Little Difficulty moving from lying on back to sitting on the  side of the bed? : A Little Difficulty sitting down on and standing up from a chair with arms (e.g., wheelchair, bedside commode, etc,.)?: A Little Help needed moving to and from a bed to chair (including a wheelchair)?: A Little Help needed walking in hospital room?: A Little Help needed climbing 3-5 steps with a railing? : A Little 6 Click Score: 18    End of Session Equipment Utilized During Treatment: Gait belt Activity Tolerance: Patient limited by fatigue Patient left: in chair;with call bell/phone within reach;with chair alarm set   PT Visit Diagnosis: Muscle weakness (generalized) (M62.81);Difficulty in walking, not elsewhere classified (R26.2)    Time: 3428-7681 PT Time Calculation (min) (ACUTE ONLY): 16 min   Charges:   PT Evaluation $PT Eval Low Complexity: 1 Procedure     PT G Codes:   PT G-Codes **NOT FOR INPATIENT CLASS** Functional Assessment Tool Used: AM-PAC 6 Clicks Basic Mobility;Clinical judgement Functional Limitation: Mobility: Walking and moving around Mobility: Walking and Moving Around Current Status (L5726): At least 1 percent but less than 20 percent impaired, limited or restricted Mobility: Walking and Moving Around Goal Status [phone]): At least 1 percent but less than 20 percent impaired, limited or restricted     Weston Anna, MPT Pager: [phone]

## 2016-09-06 NOTE — Progress Notes (Signed)
CRITICAL VALUE ALERT  Critical Value:  Troponin 0.03  Date & Time Notied:  09/06/2016 5537  Provider Notified: Lamar Blinks NP  Orders Received/Actions taken:

## 2016-09-06 NOTE — Progress Notes (Signed)
CSW consulted to assist with d/c planning. Pt is from [facility], [place]. Pt is alert and orient x4 . Pt reports that she has been living at ALF for about 2 yrs and is happy there. Pt plans to return to ALF at d/c.Pt reports that she ambulates with a walker at facility. Pt gave CSW permission to contact her daughter, Manuela Schwartz, [phone], to  Offer assistance with d/c planning. Daughter reports that pt has been receiving PT / OT at ALF. Daughter would like pt to return to ALF at d/c. CSW has contacted ALF and clinicals sent for review. ALF will readmit once pt is stable for d/c.  CSW will continue to follow to assist with d/c planning needs.  Werner Lean LCSW [phone]

## 2016-09-06 NOTE — Care Management Obs Status (Signed)
[facility] NOTIFICATION   Patient Details  Name: Alexandra Luna MRN: [id number] Date of Birth: January 25, 1927   Medicare Observation Status Notification Given:  Yes    MahabirJuliann Pulse, RN 09/06/2016, 2:44 PM

## 2016-09-06 NOTE — Progress Notes (Signed)
Received call from the lab that patient's MRSA PCR came back invalid and was informed that they are going to attempt a second time.  RN will recollect MRSA PCR if second attempt unsuccessful.  Will await call from lab.

## 2016-09-06 NOTE — Progress Notes (Addendum)
PROGRESS NOTE    Alexandra Luna  MGQ:[id number] DOB: [birth year] DOA: 09/05/2016 PCP: Reymundo Poll, MD    Brief Narrative:  83 y.o. female with history of persistent atrial fibrillation, hypertension, diastolic CHF, hyperlipidemia presents from assisted living facility after patient was finding it increasingly weak over the last few days. Patient had come to the ER 2 weeks ago and was diagnosed with UTI. Patient was placed on antibiotics. Recently patient also was placed on increasing Lasix dose by patient's primary care physician. Patient noticed that over the last few days patient has become very weak sometimes dizzy on trying to stand. Denies any chest pain and some nausea.   Assessment & Plan:   Principal Problem:   Generalized weakness Active Problems:   History of breast cancer in female   AF (atrial fibrillation) (HCC)   Essential hypertension   Weakness   Chronic diastolic CHF (congestive heart failure) (HCC)   ARF (acute renal failure) (HCC)   Weakness generalized   1. Generalized weakness with acute renal failure and dehydration 1. Symptoms likely related to presenting dehydration 2. Clinically improved with IVF hydration 3. Will continue IVF hydration overnight 4. Repeat bmet in AM 5. Seen by PT. Recs for home PT at ALF 2. Atrial fibrillation 1. Rate was initially mildly elevated at presentation 2. Will continue with oral beta blockers and verapamil.  3. Patient tolerating Apixaban. Chads 2 vasc score of more than 2. 3. Diastolic CHF last EF measured in 2014 was 65-70% 1. Diuretics on hold secondary to #1. 4. Hypertension 1. Diovan on hold secondary to presenting acute renal failure.  2. Continue PRN IV hydralazine for systolic blood pressure more than 160. 5. Blood in stools - new problem 1. Patient reports on/off BRBPR related to hx of hemorrhoids 2. Labs reviewed. hgb remains stable 3. Given risk of stroke, benefits to anticoagulation outweigh risks at this time.  Will monitor carefully  DVT prophylaxis: Eliquis  Code Status: DNR Family Communication: Pt in room, family not at bedside Disposition Plan: Anticipate return to ALF in 24hrs  Consultants:     Procedures:     Antimicrobials: Anti-infectives    None       Subjective: Still feels weak  Objective: Vitals:   09/06/16 0057 09/06/16 0119 09/06/16 0538 09/06/16 1244  BP: (!) 150/78  (!) 108/57 (!) 114/57  Pulse: 94  80 72  Resp: 16  20 18   Temp: 98.9 F (37.2 C)  98.6 F (37 C) 98.4 F (36.9 C)  TempSrc: Oral  Oral Oral  SpO2: 98%  96% 96%  Weight:  87.2 kg (192 lb 3.9 oz)    Height:  5\' 6"  (1.676 m)      Intake/Output Summary (Last 24 hours) at 09/06/16 1704 Last data filed at 09/06/16 1315  Gross per 24 hour  Intake              365 ml  Output              600 ml  Net             -235 ml   Filed Weights   09/06/16 0119  Weight: 87.2 kg (192 lb 3.9 oz)    Examination:  General exam: Appears calm and comfortable  Respiratory system: Clear to auscultation. Respiratory effort normal. Cardiovascular system: S1 & S2 heard, RRR. No JVD, murmurs, rubs, gallops or clicks. No pedal edema. Gastrointestinal system: Abdomen is nondistended, soft and nontender. No organomegaly or masses felt.  Normal bowel sounds heard. Central nervous system: Alert and oriented. No focal neurological deficits. Extremities: Symmetric 5 x 5 power. Skin: No rashes, lesions  Psychiatry: Judgement and insight appear normal. Mood & affect appropriate.   Data Reviewed: I have personally reviewed following labs and imaging studies  CBC:  Recent Labs Lab 09/05/16 1309 09/06/16 0540  WBC 11.8* 12.2*  HGB 12.8 11.5*  HCT 38.1 34.1*  MCV 92.3 90.7  PLT 320 941   Basic Metabolic Panel:  Recent Labs Lab 09/05/16 1309 09/06/16 0540  NA 132* 132*  K 4.5 4.3  CL 94* 97*  CO2 26 25  GLUCOSE 147* 120*  BUN 28* 28*  CREATININE 1.40* 1.09*  CALCIUM 10.9* 9.9   GFR: Estimated  Creatinine Clearance: 40.5 mL/min (A) (by C-G formula based on SCr of 1.09 mg/dL (H)). Liver Function Tests:  Recent Labs Lab 09/05/16 1309  AST 28  ALT 20  ALKPHOS 90  BILITOT 0.2*  PROT 7.1  ALBUMIN 3.8   No results for input(s): LIPASE, AMYLASE in the last 168 hours. No results for input(s): AMMONIA in the last 168 hours. Coagulation Profile: No results for input(s): INR, PROTIME in the last 168 hours. Cardiac Enzymes:  Recent Labs Lab 09/06/16 0540  TROPONINI 0.03*   BNP (last 3 results) No results for input(s): PROBNP in the last 8760 hours. HbA1C: No results for input(s): HGBA1C in the last 72 hours. CBG: No results for input(s): GLUCAP in the last 168 hours. Lipid Profile: No results for input(s): CHOL, HDL, LDLCALC, TRIG, CHOLHDL, LDLDIRECT in the last 72 hours. Thyroid Function Tests:  Recent Labs  09/06/16 0540  TSH 2.537   Anemia Panel: No results for input(s): VITAMINB12, FOLATE, FERRITIN, TIBC, IRON, RETICCTPCT in the last 72 hours. Sepsis Labs:  Recent Labs Lab 09/05/16 2323 09/06/16 0540  LATICACIDVEN 3.6* 1.4    Recent Results (from the past 240 hour(s))  MRSA PCR Screening     Status: None   Collection Time: 09/06/16  1:09 AM  Result Value Ref Range Status   MRSA by PCR NEGATIVE NEGATIVE Final    Comment:        The GeneXpert MRSA Assay (FDA approved for NASAL specimens only), is one component of a comprehensive MRSA colonization surveillance program. It is not intended to diagnose MRSA infection nor to guide or monitor treatment for MRSA infections.      Radiology Studies: Dg Chest 2 View  Result Date: 09/05/2016 CLINICAL DATA:  Weakness EXAM: CHEST  2 VIEW COMPARISON:  04/01/2015 FINDINGS: No focal consolidation or pleural effusion. Mild to moderate cardiomegaly unchanged. Aortic atherosclerosis. No pneumothorax. Surgical clips in the left axilla. IMPRESSION: Cardiomegaly.  No edema or infiltrate. Electronically Signed   By: Donavan Foil M.D.   On: 09/05/2016 20:00    Scheduled Meds: . apixaban  2.5 mg Oral BID  . atenolol  25 mg Oral QHS  . calcium carbonate  1,250 mg Oral Daily  . fluticasone  1 spray Each Nare QHS  . multivitamin with minerals  1 tablet Oral Daily  . rosuvastatin  10 mg Oral QODAY  . verapamil  240 mg Oral QHS   Continuous Infusions:   LOS: 0 days   Yenny Kosa, Orpah Melter, MD [facility] Pager [phone]  If 7PM-7AM, please contact night-coverage [email or web address] Password [email or web address] 09/06/2016, 5:04 PM

## 2016-09-06 NOTE — H&P (Signed)
History and Physical    Alexandra Luna NLZ:[id number] DOB: [birth year] DOA: 09/05/2016  PCP: Reymundo Poll, MD  Patient coming from: Assisted living facility.  Chief Complaint: Weakness.  HPI: Alexandra Luna is a 83 y.o. female with history of persistent atrial fibrillation, hypertension, diastolic CHF, hyperlipidemia presents from assisted living facility after patient was finding it increasingly weak over the last few days. Patient had come to the ER 2 weeks ago and was diagnosed with UTI. Patient was placed on antibiotics. Recently patient also was placed on increasing Lasix dose by patient's primary care physician. Patient noticed that over the last few days patient has become very weak sometimes dizzy on trying to stand. Denies any chest pain and some nausea.   ED Course: In the ER patient was found to have mildly elevated heart rate with known history of A. fib. Patient was given metoprolol 5 mg IV. Labs reveal elevated lactate. Chest x-ray was unremarkable. Patient was given 500 mL bolus for increased creatinine and lactate levels. Patient admitted for further observation.  Review of Systems: As per HPI, rest all negative.   Past Medical History:  Diagnosis Date  . Anxiety   . Atrial fibrillation (HCC)    On chronic Eliquis  . Breast cancer ([place]) dx'd 1997   left; lt lumpectomy and XRT  . Mixed hyperlipidemia   . Palpitations   . Pre-diabetes   . Renal hypertension 03/07/2005   no evidence of significant diameter reduction, dissection, tortuosity, FMD or orther vascular abnormality; kidneys equal in size, symmetry, with normal cortex and medulla    Past Surgical History:  Procedure Laterality Date  . BREAST LUMPECTOMY    . BREAST LUMPECTOMY  1998   left side  . CARDIOVASCULAR STRESS TEST  526/2011   R/Dipyridamole - EF 71%; normal myocaridal perfusion w/o evidence of ischemia or infarct; no schintigraphic evidence of inducible myocaridal ischemia; global LV function normal; EKG  negative for ischemia; low risk scan   . CARDIOVERSION N/A 11/12/2012   Procedure: CARDIOVERSION;  Surgeon: Troy Sine, MD;  Location: [facility];  Service: Cardiovascular;  Laterality: N/A;  . CESAREAN SECTION     x 3  . DOPPLER ECHOCARDIOGRAPHY  08/29/2011   EF >90%;  LV systolic fcn normal; doppler flow pattern suggestive of impaired LV relaxation; LA mildly dilated; aortic valve mildly sclerotic, no aortic stenosis  . tonsillectomy       reports that she quit smoking about 28 years ago. Her smoking use included Cigarettes. She has a 7.50 pack-year smoking history. She has never used smokeless tobacco. She reports that she drinks alcohol. She reports that she does not use drugs.  Allergies  Allergen Reactions  . Influenza Vaccines Other (See Comments)    Pt's arm swelled and dr told her not to take vaccine again.  P[place] Fleeting [Trandolapril-Verapamil Hcl Er] Other (See Comments)    Increase blood pressure  . Adhesive [Tape] Rash    Adhesive bandaids   . Azithromycin Rash  . Lipitor [Atorvastatin] Rash  . Penicillins Rash    Family History  Problem Relation Age of Onset  . Heart failure Mother   . Breast cancer Mother   . Hypertension Father   . Kidney cancer Father   . Stroke Maternal Grandmother     Prior to Admission medications   Medication Sig Start Date End Date Taking? Authorizing Provider  acetaminophen (TYLENOL) 325 MG tablet Take 650 mg by mouth 2 (two) times daily as needed for mild  pain.   Yes [provider]  albuterol (PROVENTIL HFA;VENTOLIN HFA) 108 (90 Base) MCG/ACT inhaler Inhale 1 puff into the lungs every 4 (four) hours as needed for wheezing or shortness of breath. 04/03/15  Yes Nita Sells, MD  apixaban (ELIQUIS) 2.5 MG TABS tablet Take 1 tablet (2.5 mg total) by mouth 2 (two) times daily. 12/23/13  Yes Troy Sine, MD  atenolol (TENORMIN) 25 MG tablet Take 1 tablet (25 mg total) by mouth at bedtime. 07/01/14  Yes Robbie Lis, MD    calcium carbonate (OS-CAL) 600 MG TABS Take 600 mg by mouth daily.    Yes [provider]  fluticasone (FLONASE) 50 MCG/ACT nasal spray Place 1 spray into both nostrils at bedtime.  07/04/12  Yes [provider]  furosemide (LASIX) 20 MG tablet Take 1 tablet (20 mg total) by mouth daily. 08/15/14  Yes Robbie Lis, MD  Multiple Vitamins-Iron (MULTIVITAMINS WITH IRON) TABS tablet Take 1 tablet by mouth daily.   Yes [provider]  rosuvastatin (CRESTOR) 10 MG tablet Take 1 tablet (10 mg total) by mouth every other day. 05/10/14  Yes Troy Sine, MD  spironolactone (ALDACTONE) 25 MG tablet Take 1 tablet by mouth daily. 02/17/15  Yes [provider]  sulfamethoxazole-trimethoprim (BACTRIM DS,SEPTRA DS) 800-160 MG tablet Take 1 tablet by mouth 2 (two) times daily.   Yes [provider]  traMADol (ULTRAM) 50 MG tablet Take 1 tablet (50 mg total) by mouth every 6 (six) hours as needed. 08/25/16  Yes Milton Ferguson, MD  valsartan (DIOVAN) 40 MG tablet Take 1 tablet (40 mg total) by mouth daily. Patient taking differently: Take 20 mg by mouth daily.  02/15/14  Yes Troy Sine, MD  verapamil (VERELAN PM) 240 MG 24 hr capsule Take 1 capsule (240 mg total) by mouth at bedtime. 05/03/14  Yes Troy Sine, MD  cephALEXin (KEFLEX) 500 MG capsule Take 1 capsule (500 mg total) by mouth 4 (four) times daily. Patient not taking: Reported on 09/05/2016 08/25/16   Milton Ferguson, MD  feeding supplement, ENSURE ENLIVE, (ENSURE ENLIVE) LIQD Take 237 mLs by mouth 2 (two) times daily between meals. Patient not taking: Reported on 09/05/2016 07/01/14   Robbie Lis, MD  metoCLOPramide (REGLAN) 10 MG tablet Take 1 tablet (10 mg total) by mouth every 8 (eight) hours as needed for nausea. Patient not taking: Reported on 09/05/2016 03/21/15   Harvel Quale, MD  ondansetron (ZOFRAN) 4 MG tablet Take 1 tablet (4 mg total) by mouth every 6 (six) hours as needed for  nausea. Patient not taking: Reported on 05/12/2015 08/15/14   Robbie Lis, MD  predniSONE (DELTASONE) 20 MG tablet Take 3 tablets (60 mg total) by mouth daily before breakfast. Patient not taking: Reported on 05/12/2015 04/03/15   Nita Sells, MD  Spacer/Aero-Holding Chambers (AEROCHAMBER PLUS FLO-VU MEDIUM) MISC 1 each by Other route once. 04/03/15   Nita Sells, MD    Physical Exam: Vitals:   09/05/16 2023 09/05/16 2253 09/06/16 0057 09/06/16 0119  BP: (!) 138/100 126/79 (!) 150/78   Pulse: (!) 106 91 94   Resp: 19 18 16    Temp:   98.9 F (37.2 C)   TempSrc:   Oral   SpO2: 99% 98% 98%   Weight:    87.2 kg (192 lb 3.9 oz)  Height:    5\' 6"  (1.676 m)      Constitutional: Moderately built and nourished. Vitals:   09/05/16 2023  09/05/16 2253 09/06/16 0057 09/06/16 0119  BP: (!) 138/100 126/79 (!) 150/78   Pulse: (!) 106 91 94   Resp: 19 18 16    Temp:   98.9 F (37.2 C)   TempSrc:   Oral   SpO2: 99% 98% 98%   Weight:    87.2 kg (192 lb 3.9 oz)  Height:    5\' 6"  (1.676 m)   Eyes: Anicteric no pallor. ENMT: No discharge from the ears eyes nose or mouth. Neck: No mass felt. No JVD appreciated. Respiratory: No rhonchi or crepitations. Cardiovascular: S1-S2 heard no murmurs appreciated. Abdomen: Soft nontender bowel sounds present. Musculoskeletal: No edema. No joint effusion. Skin: No rash. Skin appears warm. Neurologic: Alert awake oriented to time place and person. Moves all extremities. Psychiatric: Appears normal. Normal affect.   Labs on Admission: I have personally reviewed following labs and imaging studies  CBC:  Recent Labs Lab 09/05/16 1309  WBC 11.8*  HGB 12.8  HCT 38.1  MCV 92.3  PLT 811   Basic Metabolic Panel:  Recent Labs Lab 09/05/16 1309  NA 132*  K 4.5  CL 94*  CO2 26  GLUCOSE 147*  BUN 28*  CREATININE 1.40*  CALCIUM 10.9*   GFR: Estimated Creatinine Clearance: 31.5 mL/min (A) (by C-G formula based on SCr of 1.4 mg/dL  (H)). Liver Function Tests:  Recent Labs Lab 09/05/16 1309  AST 28  ALT 20  ALKPHOS 90  BILITOT 0.2*  PROT 7.1  ALBUMIN 3.8   No results for input(s): LIPASE, AMYLASE in the last 168 hours. No results for input(s): AMMONIA in the last 168 hours. Coagulation Profile: No results for input(s): INR, PROTIME in the last 168 hours. Cardiac Enzymes: No results for input(s): CKTOTAL, CKMB, CKMBINDEX, TROPONINI in the last 168 hours. BNP (last 3 results) No results for input(s): PROBNP in the last 8760 hours. HbA1C: No results for input(s): HGBA1C in the last 72 hours. CBG: No results for input(s): GLUCAP in the last 168 hours. Lipid Profile: No results for input(s): CHOL, HDL, LDLCALC, TRIG, CHOLHDL, LDLDIRECT in the last 72 hours. Thyroid Function Tests: No results for input(s): TSH, T4TOTAL, FREET4, T3FREE, THYROIDAB in the last 72 hours. Anemia Panel: No results for input(s): VITAMINB12, FOLATE, FERRITIN, TIBC, IRON, RETICCTPCT in the last 72 hours. Urine analysis:    Component Value Date/Time   COLORURINE YELLOW 09/05/2016 1303   APPEARANCEUR HAZY (A) 09/05/2016 1303   LABSPEC 1.010 09/05/2016 1303   PHURINE 5.0 09/05/2016 1303   GLUCOSEU NEGATIVE 09/05/2016 1303   HGBUR LARGE (A) 09/05/2016 1303   BILIRUBINUR NEGATIVE 09/05/2016 1303   KETONESUR NEGATIVE 09/05/2016 1303   PROTEINUR NEGATIVE 09/05/2016 1303   UROBILINOGEN 0.2 06/27/2014 1305   NITRITE NEGATIVE 09/05/2016 1303   LEUKOCYTESUR MODERATE (A) 09/05/2016 1303   Sepsis Labs: @LABRCNTIP (procalcitonin:4,lacticidven:4) )No results found for this or any previous visit (from the past 240 hour(s)).   Radiological Exams on Admission: Dg Chest 2 View  Result Date: 09/05/2016 CLINICAL DATA:  Weakness EXAM: CHEST  2 VIEW COMPARISON:  04/01/2015 FINDINGS: No focal consolidation or pleural effusion. Mild to moderate cardiomegaly unchanged. Aortic atherosclerosis. No pneumothorax. Surgical clips in the left axilla.  IMPRESSION: Cardiomegaly.  No edema or infiltrate. Electronically Signed   By: Donavan Foil M.D.   On: 09/05/2016 20:00      Assessment/Plan Principal Problem:   Generalized weakness Active Problems:   History of breast cancer in female   AF (atrial fibrillation) ([place])   Essential hypertension  Weakness   Chronic diastolic CHF (congestive heart failure) (HCC)   ARF (acute renal failure) (HCC)   Weakness generalized    1. Generalized weakness with acute renal failure and dehydration - suspect patient's symptoms are from dehydration. We'll hold off Lasix and Aldactone. Patient did receive 500 mL normal saline bolus. Recheck lactate and metabolic panel in a.m. Check troponin. Get physical therapy consult. 2. Atrial fibrillation - rate was initially mildly elevated. Improved 5 mg IV metoprolol. Will continue with oral beta blockers and verapamil. Patient is on Apixaban. Chads 2 vasc score of more than 2. 3. Diastolic CHF last EF measured in 2014 was 65-70% - holding diuretics secondary to #1. 4. Hypertension - holding Diovan for now due to acute renal failure. When necessary IV hydralazine for systolic blood pressure more than 160.   DVT prophylaxis: Apixaban. Code Status: DO NOT RESUSCITATE.  Family Communication: Discussed with patient.  Disposition Plan: Back to assisted living.  Consults called: Physical therapy.  Admission status: Observation.    Rise Patience MD [facility] Pager [phone].  If 7PM-7AM, please contact night-coverage [email or web address] Password [email or web address]  09/06/2016, 1:27 AM

## 2016-09-06 NOTE — Care Management Note (Signed)
Case Management Note  Patient Details  Name: Alexandra Luna MRN: [id number] Date of Birth: [birth year]  Subjective/Objective: 83 y/o f admitted w/Generalized weakness. Hx: afib. From [employer].C/o blood in stool. cbc in am. PT cons-await recc. CSW following.                  Action/Plan:d/c plan ALF.   Expected Discharge Date:                  Expected Discharge Plan:  [place]  In-House Referral:  Clinical Social Work  Discharge planning Services  CM Consult  Post Acute Care Choice:    Choice offered to:     DME Arranged:    DME Agency:     HH Arranged:    [place] Agency:     Status of Service:  In process, will continue to follow  If discussed at Long Length of Stay Meetings, dates discussed:    Additional Comments:  Dessa Phi, RN 09/06/2016, 2:20 PM

## 2016-09-06 NOTE — ED Provider Notes (Signed)
Patient seen in conjunction with orienting PA. Please see his note for further.  Briefly, patient presented with feeling generally weak and fatigued over the past several weeks that has worsened recently. She was recently treated for urinary tract infection and has completed a course of antibiotics. She denies current urinary symptoms she also reports some increased dyspnea on exertion over the past 2 weeks that is increased recently. Initially, there is some difficulty obtaining blood work due to difficult IV access. On my examination patient is afebrile nontoxic-appearing. She ambulates to the restroom on pulse ox and oxygen saturation is between 89 and 91% on room air. Slight bilateral lower extremity edema noted. She tells me she has not eaten since this morning and requested food. I provided her with apple sauce, sandwich and ice water.  Her troponin is not elevated. BNP is 181. Urinalysis shows moderate leukocytes and is nitrite negative. Wilson urine for culture due to patient denying urinary symptoms at this time. BMP reveals an elevated creatinine at 1.40. This is increased from a baseline of 0.75. Patient with hypoxia with ambulation and acute kidney injury. We will admit to medicine. Patient agrees with plan.   I consulted with Dr. Hal Hope who accepted the patient for admission.   This patient was discussed with and evaluated by Dr. Gilford Raid who agrees with assessment and plan.   Weakness  Generalized weakness  Hypoxia  AKI (acute kidney injury) [facility])     Waynetta Pean, PA-C 09/06/16 Herminio Heads, MD 09/10/16 1101

## 2016-09-06 NOTE — Progress Notes (Signed)
Patient had a bright red bloody small BM. When asking the patient if this has happened before, patient stated that it happens all the time.  Said she has hemorrhoids and wears pads because she bleeds from time to time from them.  Will continue to monitor.

## 2016-09-07 DIAGNOSIS — R0902 Hypoxemia: Secondary | ICD-10-CM

## 2016-09-07 DIAGNOSIS — I10 Essential (primary) hypertension: Secondary | ICD-10-CM

## 2016-09-07 DIAGNOSIS — I5032 Chronic diastolic (congestive) heart failure: Secondary | ICD-10-CM | POA: Diagnosis not present

## 2016-09-07 DIAGNOSIS — N179 Acute kidney failure, unspecified: Secondary | ICD-10-CM | POA: Diagnosis not present

## 2016-09-07 DIAGNOSIS — R531 Weakness: Secondary | ICD-10-CM | POA: Diagnosis not present

## 2016-09-07 LAB — CBC
HEMATOCRIT: 33 % — AB (ref 36.0–46.0)
Hemoglobin: 11 g/dL — ABNORMAL LOW (ref 12.0–15.0)
MCH: 30.8 pg (ref 26.0–34.0)
MCHC: 33.3 g/dL (ref 30.0–36.0)
MCV: 92.4 fL (ref 78.0–100.0)
PLATELETS: 273 10*3/uL (ref 150–400)
RBC: 3.57 MIL/uL — AB (ref 3.87–5.11)
RDW: 14.6 % (ref 11.5–15.5)
WBC: 9.9 10*3/uL (ref 4.0–10.5)

## 2016-09-07 LAB — BASIC METABOLIC PANEL
ANION GAP: 6 (ref 5–15)
BUN: 25 mg/dL — AB (ref 6–20)
CHLORIDE: 99 mmol/L — AB (ref 101–111)
CO2: 26 mmol/L (ref 22–32)
Calcium: 9.8 mg/dL (ref 8.9–10.3)
Creatinine, Ser: 0.92 mg/dL (ref 0.44–1.00)
GFR calc Af Amer: >60 mL/min (ref >60)
GFR calc non Af Amer: 54 mL/min — ABNORMAL LOW (ref >60)
GLUCOSE: 101 mg/dL — AB (ref 65–99)
POTASSIUM: 4.5 mmol/L (ref 3.5–5.1)
Sodium: 131 mmol/L — ABNORMAL LOW (ref 135–145)

## 2016-09-07 NOTE — Progress Notes (Signed)
Physical Therapy Treatment Patient Details Name: Alexandra Luna MRN: [id number] DOB: 1927-02-22 Today's Date: 09/07/2016    History of Present Illness 83 yo female admitted with general weakness. Hx of Afib, CHF, HTN, anxiety, breast cancer, COPD.     PT Comments    Progressing slowly with mobility. Pt c/o nausea at start of session and stated she had been waiting for 20 minutes for nursing to assist her. Pt requested to return to bed. Once standing, pt was agreeable to walk a bit before getting back into bed. Min assist to walk ~40 feet x 2 with a rollator. She fatigued quickly and required a seated rest break. Assisted pt back to bed at her request and notified nursing again about nausea. Pt may require increased assistance at ALF when she returns. Will continue to follow.     Follow Up Recommendations  Home health PT (at ALF);Supervision/Assistance - 24 hour      Equipment Recommendations  None recommended by PT    Recommendations for Other Services       Precautions / Restrictions Precautions Precautions: Fall Restrictions Weight Bearing Restrictions: No    Mobility  Bed Mobility Overal bed mobility: Needs Assistance Bed Mobility: Sit to Supine       Sit to supine: Supervision      Transfers Overall transfer level: Needs assistance Equipment used: 4-wheeled walker Transfers: Sit to/from Stand Sit to Stand: Min guard         General transfer comment: close guard for safety.   Ambulation/Gait Ambulation/Gait assistance: Min assist Ambulation Distance (Feet): 40 Feet (x2) Assistive device: 4-wheeled walker Gait Pattern/deviations: Step-through pattern;Decreased stride length     General Gait Details: Pt fatigues easily. Dyspnea 2/4. Seated rest break needed after ~40 feet. Assist to stabilize intermittently.   Stairs            Wheelchair Mobility    Modified Rankin (Stroke Patients Only)       Balance                                             Cognition Arousal/Alertness: Awake/alert Behavior During Therapy: WFL for tasks assessed/performed Overall Cognitive Status: Within Functional Limits for tasks assessed                                        Exercises      General Comments        Pertinent Vitals/Pain Pain Assessment: No/denies pain    Home Living                      Prior Function            PT Goals (current goals can now be found in the care plan section) Progress towards PT goals: Progressing toward goals    Frequency    Min 3X/week      PT Plan Current plan remains appropriate    Co-evaluation              AM-PAC PT "6 Clicks" Daily Activity  Outcome Measure  Difficulty turning over in bed (including adjusting bedclothes, sheets and blankets)?: None Difficulty moving from lying on back to sitting on the side of the bed? : None Difficulty sitting down on and standing up  from a chair with arms (e.g., wheelchair, bedside commode, etc,.)?: A Little Help needed moving to and from a bed to chair (including a wheelchair)?: A Little Help needed walking in hospital room?: A Little Help needed climbing 3-5 steps with a railing? : A Little 6 Click Score: 20    End of Session Equipment Utilized During Treatment: Gait belt Activity Tolerance: Patient limited by fatigue Patient left: in bed;with call bell/phone within reach;with bed alarm set   PT Visit Diagnosis: Muscle weakness (generalized) (M62.81);Difficulty in walking, not elsewhere classified (R26.2)     Time: 8372-9021 PT Time Calculation (min) (ACUTE ONLY): 16 min  Charges:  $Gait Training: 8-22 mins                    G Codes:         Weston Anna, MPT Pager: [phone]

## 2016-09-07 NOTE — Discharge Summary (Signed)
Physician Discharge Summary  Alexandra Luna QPY:[id number] DOB: 1926/11/21 DOA: 09/05/2016  PCP: Reymundo Poll, MD  Admit date: 09/05/2016 Discharge date: 09/07/2016  Admitted From: ALF Disposition:  ALF  Recommendations for Outpatient Follow-up:  1. Follow up with PCP in 1-2 weeks 2. Recommend follow up BMET and CBC in 1-2 weeks  Home Health:HH PT  Equipment/Devices:None    Discharge Condition:Improved CODE STATUS:DNR Diet recommendation: Heart healthy   Brief/Interim Summary: 83 y.o.femalewith history of persistent atrial fibrillation, hypertension, diastolic CHF, hyperlipidemia presents from assisted living facility after patient was finding it increasingly weak over the last few days. Patient had come to the ER 2 weeks ago and was diagnosed with UTI. Patient was placed on antibiotics. Recently patient also was placed on increasing Lasix dose by patient's primary care physician. Patient noticed that over the last few days patient has become very weak sometimes dizzy on trying to stand. Denies any chest pain and some nausea.  1. Generalized weakness with acute renal failure and dehydration 1. Symptoms likely related to presenting dehydration 2. Clinically improved with IVF hydration 3. Renal function normalized 2. Atrial fibrillation 1. Rate was initially mildly elevated at presentation 2. Patient continued on oral beta blockers and verapamil.  3. Patient tolerating Apixaban.Chads 2 vasc score of more than 2. 3. Diastolic CHF last EF measured in 2014 was 65-70% 1. Diuretics held this admit secondary to #1. 4. Hypertension 1. Diovan on hold secondary to presenting acute renal failure.  2. Continued PRN IV hydralazine for systolic blood pressure more than 160. 5. Blood in stools  1. Patient reports on/off BRBPR related to hx of hemorrhoids 2. Labs reviewed. hgb remains stable 3. Given risk of stroke, benefits to anticoagulation outweigh risks at this time. 4. Recommend close  outpatient follow up  Discharge Diagnoses:  Principal Problem:   Generalized weakness Active Problems:   History of breast cancer in female   AF (atrial fibrillation) (HCC)   Essential hypertension   Weakness   Chronic diastolic CHF (congestive heart failure) ([place])   ARF (acute renal failure) ([place])   Weakness generalized    Discharge Instructions   Allergies as of 09/07/2016      Reactions   Influenza Vaccines Other (See Comments)   Pt's arm swelled and dr told her not to take vaccine again.   Tarka [trandolapril-verapamil Hcl Er] Other (See Comments)   Increase blood pressure   Adhesive [tape] Rash   Adhesive bandaids    Azithromycin Rash   Lipitor [atorvastatin] Rash   Penicillins Rash      Medication List    STOP taking these medications   cephALEXin 500 MG capsule Commonly known as:  KEFLEX   feeding supplement (ENSURE ENLIVE) Liqd   predniSONE 20 MG tablet Commonly known as:  DELTASONE   sulfamethoxazole-trimethoprim 800-160 MG tablet Commonly known as:  BACTRIM DS,SEPTRA DS   valsartan 40 MG tablet Commonly known as:  DIOVAN     TAKE these medications   acetaminophen 325 MG tablet Commonly known as:  TYLENOL Take 650 mg by mouth 2 (two) times daily as needed for mild pain.   AEROCHAMBER PLUS FLO-VU MEDIUM Misc 1 each by Other route once.   albuterol 108 (90 Base) MCG/ACT inhaler Commonly known as:  PROVENTIL HFA;VENTOLIN HFA Inhale 1 puff into the lungs every 4 (four) hours as needed for wheezing or shortness of breath.   apixaban 2.5 MG Tabs tablet Commonly known as:  ELIQUIS Take 1 tablet (2.5 mg total) by mouth 2 (  two) times daily.   atenolol 25 MG tablet Commonly known as:  TENORMIN Take 1 tablet (25 mg total) by mouth at bedtime.   calcium carbonate 600 MG Tabs tablet Commonly known as:  OS-CAL Take 600 mg by mouth daily.   fluticasone 50 MCG/ACT nasal spray Commonly known as:  FLONASE Place 1 spray into both nostrils at bedtime.    furosemide 20 MG tablet Commonly known as:  LASIX Take 1 tablet (20 mg total) by mouth daily.   metoCLOPramide 10 MG tablet Commonly known as:  REGLAN Take 1 tablet (10 mg total) by mouth every 8 (eight) hours as needed for nausea.   multivitamins with iron Tabs tablet Take 1 tablet by mouth daily.   ondansetron 4 MG tablet Commonly known as:  ZOFRAN Take 1 tablet (4 mg total) by mouth every 6 (six) hours as needed for nausea.   rosuvastatin 10 MG tablet Commonly known as:  CRESTOR Take 1 tablet (10 mg total) by mouth every other day.   spironolactone 25 MG tablet Commonly known as:  ALDACTONE Take 1 tablet by mouth daily.   traMADol 50 MG tablet Commonly known as:  ULTRAM Take 1 tablet (50 mg total) by mouth every 6 (six) hours as needed.   verapamil 240 MG 24 hr capsule Commonly known as:  VERELAN PM Take 1 capsule (240 mg total) by mouth at bedtime.       Allergies  Allergen Reactions  . Influenza Vaccines Other (See Comments)    Pt's arm swelled and dr told her not to take vaccine again.  P[place] Fleeting [Trandolapril-Verapamil Hcl Er] Other (See Comments)    Increase blood pressure  . Adhesive [Tape] Rash    Adhesive bandaids   . Azithromycin Rash  . Lipitor [Atorvastatin] Rash  . Penicillins Rash   Procedures/Studies: Dg Chest 2 View  Result Date: 09/05/2016 CLINICAL DATA:  Weakness EXAM: CHEST  2 VIEW COMPARISON:  04/01/2015 FINDINGS: No focal consolidation or pleural effusion. Mild to moderate cardiomegaly unchanged. Aortic atherosclerosis. No pneumothorax. Surgical clips in the left axilla. IMPRESSION: Cardiomegaly.  No edema or infiltrate. Electronically Signed   By: Donavan Foil M.D.   On: 09/05/2016 20:00   Ct Head Wo Contrast  Result Date: 08/25/2016 CLINICAL DATA:  Nontraumatic BILATERAL neck pain for 1 week, difficulty standing, history breast cancer, atrial fibrillation EXAM: CT HEAD WITHOUT CONTRAST CT CERVICAL SPINE WITHOUT CONTRAST TECHNIQUE:  Multidetector CT imaging of the head and cervical spine was performed following the standard protocol without intravenous contrast. Multiplanar CT image reconstructions of the cervical spine were also generated. COMPARISON:  05/12/2015 FINDINGS: CT HEAD FINDINGS Brain: Generalized atrophy. Normal ventricular morphology. No midline shift or mass effect. Small vessel chronic ischemic changes of deep cerebral white matter. 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 No intracranial hemorrhage, mass lesion, or evidence acute infarction. No extra-axial fluid collections. Vascular: Minimal atherosclerotic calcification of internal carotid arteries at skullbase Skull: Intact Sinuses/Orbits: Clear Other: N/A CT CERVICAL SPINE FINDINGS Alignment: Anterolistheses at C3-C4 and C4-C5 appear unchanged. Remaining alignments normal. Osseous: Diffuse did osseous demineralization. Visualized skullbase intact. Incomplete posterior arch C1, developmental anomaly. Vertebral body heights maintained without fracture or bone destruction. Multilevel facet degenerative changes. Soft tissues and spinal canal: Prevertebral soft tissues normal thickness. Atherosclerotic calcifications at the carotid bifurcations bilaterally, proximal great vessels, and aortic arch. Disc levels: Disc space narrowing and endplate spur formation at C4-C5, C5-C6 and C6-C7. Encroachment upon BILATERAL cervical neural foramina at C5-C6 and C6-C7 as  well as RIGHT C4-C5. Odontoid process approximates the RIGHT lateral mass of C1 unchanged. Upper chest: Question RIGHT apex lung nodule versus developing scarring 12 x 10 mm. Other: N/A IMPRESSION: Atrophy with small vessel chronic ischemic changes of deep cerebral white matter. 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 No acute intracranial abnormalities. Multilevel degenerative disc and facet disease changes of the cervical spine as above with chronic anterolisthesis at C4-C5 and C3-C4. No acute  cervical spine abnormalities. Question new 12 x 10 mm nodule at RIGHT lung apex versus developing scarring recommend short-term follow-up CT chest to assess. Electronically Signed   By: Lavonia Dana M.D.   On: 08/25/2016 13:11   Ct Cervical Spine Wo Contrast  Result Date: 08/25/2016 CLINICAL DATA:  Nontraumatic BILATERAL neck pain for 1 week, difficulty standing, history breast cancer, atrial fibrillation EXAM: CT HEAD WITHOUT CONTRAST CT CERVICAL SPINE WITHOUT CONTRAST TECHNIQUE: Multidetector CT imaging of the head and cervical spine was performed following the standard protocol without intravenous contrast. Multiplanar CT image reconstructions of the cervical spine were also generated. COMPARISON:  05/12/2015 FINDINGS: CT HEAD FINDINGS Brain: Generalized atrophy. Normal ventricular morphology. No midline shift or mass effect. Small vessel chronic ischemic changes of deep cerebral white matter. 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 No intracranial hemorrhage, mass lesion, or evidence acute infarction. No extra-axial fluid collections. Vascular: Minimal atherosclerotic calcification of internal carotid arteries at skullbase Skull: Intact Sinuses/Orbits: Clear Other: N/A CT CERVICAL SPINE FINDINGS Alignment: Anterolistheses at C3-C4 and C4-C5 appear unchanged. Remaining alignments normal. Osseous: Diffuse did osseous demineralization. Visualized skullbase intact. Incomplete posterior arch C1, developmental anomaly. Vertebral body heights maintained without fracture or bone destruction. Multilevel facet degenerative changes. Soft tissues and spinal canal: Prevertebral soft tissues normal thickness. Atherosclerotic calcifications at the carotid bifurcations bilaterally, proximal great vessels, and aortic arch. Disc levels: Disc space narrowing and endplate spur formation at C4-C5, C5-C6 and C6-C7. Encroachment upon BILATERAL cervical neural foramina at C5-C6 and C6-C7 as well as RIGHT C4-C5.  Odontoid process approximates the RIGHT lateral mass of C1 unchanged. Upper chest: Question RIGHT apex lung nodule versus developing scarring 12 x 10 mm. Other: N/A IMPRESSION: Atrophy with small vessel chronic ischemic changes of deep cerebral white matter. 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 No acute intracranial abnormalities. Multilevel degenerative disc and facet disease changes of the cervical spine as above with chronic anterolisthesis at C4-C5 and C3-C4. No acute cervical spine abnormalities. Question new 12 x 10 mm nodule at RIGHT lung apex versus developing scarring recommend short-term follow-up CT chest to assess. Electronically Signed   By: Lavonia Dana M.D.   On: 08/25/2016 13:11    Subjective: Eager to go home. Denies cp or sob  Discharge Exam: Vitals:   09/06/16 2047 09/07/16 0440  BP: 116/77 (!) 110/55  Pulse: 80 70  Resp: 18 18  Temp: 98.8 F (37.1 C) 98.3 F (36.8 C)   Vitals:   09/06/16 0538 09/06/16 1244 09/06/16 2047 09/07/16 0440  BP: (!) 108/57 (!) 114/57 116/77 (!) 110/55  Pulse: 80 72 80 70  Resp: 20 18 18 18   Temp: 98.6 F (37 C) 98.4 F (36.9 C) 98.8 F (37.1 C) 98.3 F (36.8 C)  TempSrc: Oral Oral Oral Oral  SpO2: 96% 96% 97% 97%  Weight:    87.7 kg (193 lb 5.5 oz)  Height:        General: Pt is alert, awake, not in acute distress Cardiovascular: RRR, S1/S2 +, no rubs, no gallops  Respiratory: CTA bilaterally, no wheezing, no rhonchi Abdominal: Soft, NT, ND, bowel sounds + Extremities: no edema, no cyanosis   The results of significant diagnostics from this hospitalization (including imaging, microbiology, ancillary and laboratory) are listed below for reference.     Microbiology: Recent Results (from the past 240 hour(s))  Urine culture     Status: Abnormal (Preliminary result)   Collection Time: 09/05/16  1:03 PM  Result Value Ref Range Status   Specimen Description URINE, RANDOM  Final   Special Requests NONE  Final    Culture 60,000 COLONIES/mL ENTEROCOCCUS FAECALIS (A)  Final   Report Status PENDING  Incomplete  MRSA PCR Screening     Status: None   Collection Time: 09/06/16  1:09 AM  Result Value Ref Range Status   MRSA by PCR NEGATIVE NEGATIVE Final    Comment:        The GeneXpert MRSA Assay (FDA approved for NASAL specimens only), is one component of a comprehensive MRSA colonization surveillance program. It is not intended to diagnose MRSA infection nor to guide or monitor treatment for MRSA infections.      Labs: BNP (last 3 results)  Recent Labs  09/05/16 1309  BNP 863.8*   Basic Metabolic Panel:  Recent Labs Lab 09/05/16 1309 09/06/16 0540 09/07/16 0450  NA 132* 132* 131*  K 4.5 4.3 4.5  CL 94* 97* 99*  CO2 26 25 26   GLUCOSE 147* 120* 101*  BUN 28* 28* 25*  CREATININE 1.40* 1.09* 0.92  CALCIUM 10.9* 9.9 9.8   Liver Function Tests:  Recent Labs Lab 09/05/16 1309  AST 28  ALT 20  ALKPHOS 90  BILITOT 0.2*  PROT 7.1  ALBUMIN 3.8   No results for input(s): LIPASE, AMYLASE in the last 168 hours. No results for input(s): AMMONIA in the last 168 hours. CBC:  Recent Labs Lab 09/05/16 1309 09/06/16 0540 09/07/16 0450  WBC 11.8* 12.2* 9.9  HGB 12.8 11.5* 11.0*  HCT 38.1 34.1* 33.0*  MCV 92.3 90.7 92.4  PLT 320 296 273   Cardiac Enzymes:  Recent Labs Lab 09/06/16 0540  TROPONINI 0.03*   BNP: Invalid input(s): POCBNP CBG: No results for input(s): GLUCAP in the last 168 hours. D-Dimer No results for input(s): DDIMER in the last 72 hours. Hgb A1c No results for input(s): HGBA1C in the last 72 hours. Lipid Profile No results for input(s): CHOL, HDL, LDLCALC, TRIG, CHOLHDL, LDLDIRECT in the last 72 hours. Thyroid function studies  Recent Labs  09/06/16 0540  TSH 2.537   Anemia work up No results for input(s): VITAMINB12, FOLATE, FERRITIN, TIBC, IRON, RETICCTPCT in the last 72 hours. Urinalysis    Component Value Date/Time   COLORURINE  YELLOW 09/05/2016 1303   APPEARANCEUR HAZY (A) 09/05/2016 1303   LABSPEC 1.010 09/05/2016 1303   PHURINE 5.0 09/05/2016 1303   GLUCOSEU NEGATIVE 09/05/2016 1303   HGBUR LARGE (A) 09/05/2016 1303   BILIRUBINUR NEGATIVE 09/05/2016 1303   KETONESUR NEGATIVE 09/05/2016 1303   PROTEINUR NEGATIVE 09/05/2016 1303   UROBILINOGEN 0.2 06/27/2014 1305   NITRITE NEGATIVE 09/05/2016 1303   LEUKOCYTESUR MODERATE (A) 09/05/2016 1303   Sepsis Labs Invalid input(s): PROCALCITONIN,  WBC,  LACTICIDVEN Microbiology Recent Results (from the past 240 hour(s))  Urine culture     Status: Abnormal (Preliminary result)   Collection Time: 09/05/16  1:03 PM  Result Value Ref Range Status   Specimen Description URINE, RANDOM  Final   Special Requests NONE  Final   Culture 60,000  COLONIES/mL ENTEROCOCCUS FAECALIS (A)  Final   Report Status PENDING  Incomplete  MRSA PCR Screening     Status: None   Collection Time: 09/06/16  1:09 AM  Result Value Ref Range Status   MRSA by PCR NEGATIVE NEGATIVE Final    Comment:        The GeneXpert MRSA Assay (FDA approved for NASAL specimens only), is one component of a comprehensive MRSA colonization surveillance program. It is not intended to diagnose MRSA infection nor to guide or monitor treatment for MRSA infections.      SIGNED:   Donne Hazel, MD  [facility] 09/07/2016, 12:09 PM  If 7PM-7AM, please contact night-coverage [email or web address] Password [email or web address]

## 2016-09-07 NOTE — Progress Notes (Signed)
Pt is ready to return to [facility] today. Pt / daughter's are in agreement with dc plan. D/C Summary / FL2 sent to ALF for review. Priscilla ( NSG ) gave permission for pt to return. No scripts printed. No new medications ordered. Pt's daughter provided transport back to ALF. D/C packet provided to pt prior to d/c.  Werner Lean LCSW [phone]

## 2016-09-07 NOTE — Care Management Note (Signed)
Case Management Note  Patient Details  Name: Alexandra Luna MRN: [id number] Date of Birth: 19-Mar-1927  Subjective/Objective:d/c today back to ALF-[facility]-they use [facility] for HHPT(noted on d/c summary)Per med advisor/Dr. Wyline Copas agree to CSW calling ALF, & her supv to ensure facility will take back today since patient was observation status/ABN given yesterday-attending waited on CBC results since patient had BRBPR-hgb 11.5.                     Action/Plan:d/c ALF   Expected Discharge Date:  09/07/16               Expected Discharge Plan:  [place]  In-House Referral:  Clinical Social Work  Discharge planning Services  CM Consult  Post Acute Care Choice:    Choice offered to:  Patient  DME Arranged:    DME Agency:     HH Arranged:  PT HH Agency:     Status of Service:  Completed, signed off  If discussed at [employer] of Stay Meetings, dates discussed:    Additional Comments:  Dessa Phi, RN 09/07/2016, 12:23 PM

## 2016-09-07 NOTE — NC FL2 (Signed)
[place] LEVEL OF CARE SCREENING TOOL     IDENTIFICATION  Patient Name: Alexandra Luna Birthdate: Sep 27, 1926 Sex: female Admission Date (Current Location): 09/05/2016  [facility] and [place] Number:  Herbalist and Address:  [facility],  [address] [place], [place]      Provider Number: [id number]  Attending Physician Name and Address:  Donne Hazel, MD  Relative Name and Phone Number:       Current Level of Care: Hospital Recommended Level of Care: [place] Prior Approval Number:    Date Approved/Denied:   PASRR Number:    Discharge Plan: Other (Comment) (ALF)    Current Diagnoses: Patient Active Problem List   Diagnosis Date Noted  . Weakness 09/06/2016  . Chronic diastolic CHF (congestive heart failure) ([place]) 09/06/2016  . ARF (acute renal failure) ([place]) 09/06/2016  . Weakness generalized 09/06/2016  . COPD exacerbation ([place]) 04/01/2015  . Bilateral pleural effusion 08/30/2014  . Multiple pulmonary nodules 08/30/2014  . Acute diastolic congestive heart failure ([place]) 08/14/2014  . Acute respiratory failure with hypoxia ([place]) 08/14/2014  . Generalized weakness 06/28/2014  . Chronic anticoagulation 12/08/2012  . AF (atrial fibrillation) ([place]) 09/22/2012  . Essential hypertension 09/22/2012  . Hyperlipidemia, mixed 09/22/2012  . History of breast cancer in female 04/28/2012    Orientation RESPIRATION BLADDER Height & Weight     Self, Time, Situation, Place  Normal Continent Weight: 193 lb 5.5 oz (87.7 kg) Height:  5\' 6"  (167.6 cm)  BEHAVIORAL SYMPTOMS/MOOD NEUROLOGICAL BOWEL NUTRITION STATUS  Other (Comment) (no behaviors)   Continent Diet  AMBULATORY STATUS COMMUNICATION OF NEEDS Skin Heart healthy  Limited Assist Verbally Normal                       Personal Care Assistance Level of Assistance  Bathing, Feeding, Dressing Bathing Assistance: Limited assistance Feeding assistance:  Independent Dressing Assistance: Limited assistance     Functional Limitations Info  Sight, Hearing, Speech Sight Info: Adequate Hearing Info: Adequate Speech Info: Adequate    SPECIAL CARE FACTORS FREQUENCY  PT (By licensed PT)     PT Frequency: 3x wk              Contractures Contractures Info: Not present    Additional Factors Info  Code Status Code Status Info: DNR             Current Medications (09/07/2016):  This is the current hospital active medication list Current Facility-Administered Medications  Medication Dose Route Frequency Provider Last Rate Last Dose  . 0.9 %  sodium chloride infusion   Intravenous Continuous Donne Hazel, MD 75 mL/hr at 09/07/16 0813    . acetaminophen (TYLENOL) tablet 650 mg  650 mg Oral Q6H PRN Rise Patience, MD       Or  . acetaminophen (TYLENOL) suppository 650 mg  650 mg Rectal Q6H PRN Rise Patience, MD      . albuterol (PROVENTIL) (2.5 MG/3ML) 0.083% nebulizer solution 2.5 mg  2.5 mg Nebulization Q4H PRN Rise Patience, MD      . apixaban Arne [place]) tablet 2.5 mg  2.5 mg Oral BID Rise Patience, MD   2.5 mg at 09/07/16 0818  . atenolol (TENORMIN) tablet 25 mg  25 mg Oral QHS Rise Patience, MD   25 mg at 09/06/16 2326  . calcium carbonate (OS-CAL - dosed in mg of elemental calcium) tablet 1,250 mg  1,250 mg  Oral Daily Rise Patience, MD   1,250 mg at 09/07/16 0818  . fluticasone (FLONASE) 50 MCG/ACT nasal spray 1 spray  1 spray Each Nare QHS Rise Patience, MD   1 spray at 09/06/16 2325  . hydrALAZINE (APRESOLINE) injection 10 mg  10 mg Intravenous Q4H PRN Rise Patience, MD      . multivitamin with minerals tablet 1 tablet  1 tablet Oral Daily Rise Patience, MD   1 tablet at 09/07/16 0818  . rosuvastatin (CRESTOR) tablet 10 mg  10 mg Oral Joellyn Quails, MD   10 mg at 09/06/16 0845  . traMADol (ULTRAM) tablet 50 mg  50 mg Oral Q6H PRN Rise Patience, MD    50 mg at 09/06/16 1647  . verapamil (CALAN-SR) CR tablet 240 mg  240 mg Oral QHS Rise Patience, MD   240 mg at 09/06/16 2326     Discharge Medications: Please see discharge summary for a list of discharge medicat STOP taking these medications   cephALEXin 500 MG capsule Commonly known as:  KEFLEX   feeding supplement (ENSURE ENLIVE) Liqd   predniSONE 20 MG tablet Commonly known as:  DELTASONE   sulfamethoxazole-trimethoprim 800-160 MG tablet Commonly known as:  BACTRIM DS,SEPTRA DS   valsartan 40 MG tablet Commonly known as:  DIOVAN     TAKE these medications   acetaminophen 325 MG tablet Commonly known as:  TYLENOL Take 650 mg by mouth 2 (two) times daily as needed for mild pain.   AEROCHAMBER PLUS FLO-VU MEDIUM Misc 1 each by Other route once.   albuterol 108 (90 Base) MCG/ACT inhaler Commonly known as:  PROVENTIL HFA;VENTOLIN HFA Inhale 1 puff into the lungs every 4 (four) hours as needed for wheezing or shortness of breath.   apixaban 2.5 MG Tabs tablet Commonly known as:  ELIQUIS Take 1 tablet (2.5 mg total) by mouth 2 (two) times daily.   atenolol 25 MG tablet Commonly known as:  TENORMIN Take 1 tablet (25 mg total) by mouth at bedtime.   calcium carbonate 600 MG Tabs tablet Commonly known as:  OS-CAL Take 600 mg by mouth daily.   fluticasone 50 MCG/ACT nasal spray Commonly known as:  FLONASE Place 1 spray into both nostrils at bedtime.   furosemide 20 MG tablet Commonly known as:  LASIX Take 1 tablet (20 mg total) by mouth daily.   metoCLOPramide 10 MG tablet Commonly known as:  REGLAN Take 1 tablet (10 mg total) by mouth every 8 (eight) hours as needed for nausea.   multivitamins with iron Tabs tablet Take 1 tablet by mouth daily.   ondansetron 4 MG tablet Commonly known as:  ZOFRAN Take 1 tablet (4 mg total) by mouth every 6 (six) hours as needed for nausea.   rosuvastatin 10 MG tablet Commonly known as:   CRESTOR Take 1 tablet (10 mg total) by mouth every other day.   spironolactone 25 MG tablet Commonly known as:  ALDACTONE Take 1 tablet by mouth daily.   traMADol 50 MG tablet Commonly known as:  ULTRAM Take 1 tablet (50 mg total) by mouth every 6 (six) hours as needed.   verapamil 240 MG 24 hr capsule Commonly known as:  VERELAN PM Take 1 capsule (240 mg total) by mouth at bedtime.           Allergies       Relevant Imaging Results:  Relevant Lab Results:   Additional Information SS # [id number]  Tanaya Dunigan, Randall An, LCSW

## 2016-09-07 NOTE — Progress Notes (Signed)
Attempted to call report to [facility] x 2. Left message on nursing voicemail.

## 2016-09-08 LAB — URINE CULTURE: Culture: 60000 — AB

## 2016-11-01 ENCOUNTER — Observation Stay (HOSPITAL_COMMUNITY)
Admission: EM | Admit: 2016-11-01 | Discharge: 2016-11-02 | Disposition: A | Discharge status: Skilled Nursing Facility | Payer: Medicare Other | Attending: Family Medicine | Admitting: Family Medicine

## 2016-11-01 ENCOUNTER — Emergency Department (HOSPITAL_COMMUNITY): Payer: Medicare Other

## 2016-11-01 ENCOUNTER — Inpatient Hospital Stay (HOSPITAL_BASED_OUTPATIENT_CLINIC_OR_DEPARTMENT_OTHER): Payer: Medicare Other

## 2016-11-01 ENCOUNTER — Inpatient Hospital Stay (HOSPITAL_COMMUNITY): Payer: Medicare Other

## 2016-11-01 DIAGNOSIS — Z79899 Other long term (current) drug therapy: Secondary | ICD-10-CM | POA: Diagnosis not present

## 2016-11-01 DIAGNOSIS — F329 Major depressive disorder, single episode, unspecified: Secondary | ICD-10-CM | POA: Diagnosis not present

## 2016-11-01 DIAGNOSIS — E119 Type 2 diabetes mellitus without complications: Secondary | ICD-10-CM | POA: Diagnosis not present

## 2016-11-01 DIAGNOSIS — Z66 Do not resuscitate: Secondary | ICD-10-CM | POA: Insufficient documentation

## 2016-11-01 DIAGNOSIS — R0602 Shortness of breath: Secondary | ICD-10-CM | POA: Diagnosis present

## 2016-11-01 DIAGNOSIS — I340 Nonrheumatic mitral (valve) insufficiency: Secondary | ICD-10-CM | POA: Diagnosis not present

## 2016-11-01 DIAGNOSIS — Z7901 Long term (current) use of anticoagulants: Secondary | ICD-10-CM | POA: Insufficient documentation

## 2016-11-01 DIAGNOSIS — Z7951 Long term (current) use of inhaled steroids: Secondary | ICD-10-CM | POA: Insufficient documentation

## 2016-11-01 DIAGNOSIS — I509 Heart failure, unspecified: Secondary | ICD-10-CM

## 2016-11-01 DIAGNOSIS — Z87891 Personal history of nicotine dependence: Secondary | ICD-10-CM | POA: Insufficient documentation

## 2016-11-01 DIAGNOSIS — F419 Anxiety disorder, unspecified: Secondary | ICD-10-CM | POA: Insufficient documentation

## 2016-11-01 DIAGNOSIS — I4891 Unspecified atrial fibrillation: Secondary | ICD-10-CM | POA: Diagnosis not present

## 2016-11-01 DIAGNOSIS — G8929 Other chronic pain: Secondary | ICD-10-CM | POA: Diagnosis not present

## 2016-11-01 DIAGNOSIS — J810 Acute pulmonary edema: Secondary | ICD-10-CM | POA: Diagnosis not present

## 2016-11-01 DIAGNOSIS — J449 Chronic obstructive pulmonary disease, unspecified: Secondary | ICD-10-CM | POA: Diagnosis not present

## 2016-11-01 DIAGNOSIS — I5033 Acute on chronic diastolic (congestive) heart failure: Secondary | ICD-10-CM | POA: Diagnosis not present

## 2016-11-01 DIAGNOSIS — I110 Hypertensive heart disease with heart failure: Secondary | ICD-10-CM | POA: Diagnosis not present

## 2016-11-01 DIAGNOSIS — E782 Mixed hyperlipidemia: Secondary | ICD-10-CM | POA: Diagnosis not present

## 2016-11-01 DIAGNOSIS — R0609 Other forms of dyspnea: Secondary | ICD-10-CM

## 2016-11-01 DIAGNOSIS — K5900 Constipation, unspecified: Secondary | ICD-10-CM | POA: Insufficient documentation

## 2016-11-01 DIAGNOSIS — Z853 Personal history of malignant neoplasm of breast: Secondary | ICD-10-CM | POA: Insufficient documentation

## 2016-11-01 LAB — CBC WITH DIFFERENTIAL/PLATELET
Basophils Absolute: 0 10*3/uL (ref 0.0–0.1)
Basophils Relative: 0 %
EOS ABS: 0.1 10*3/uL (ref 0.0–0.7)
Eosinophils Relative: 1 %
HEMATOCRIT: 33.9 % — AB (ref 36.0–46.0)
HEMOGLOBIN: 10.9 g/dL — AB (ref 12.0–15.0)
LYMPHS ABS: 1.1 10*3/uL (ref 0.7–4.0)
LYMPHS PCT: 12 %
MCH: 30.1 pg (ref 26.0–34.0)
MCHC: 32.2 g/dL (ref 30.0–36.0)
MCV: 93.6 fL (ref 78.0–100.0)
MONOS PCT: 11 %
Monocytes Absolute: 1 10*3/uL (ref 0.1–1.0)
NEUTROS ABS: 7 10*3/uL (ref 1.7–7.7)
NEUTROS PCT: 76 %
Platelets: 223 10*3/uL (ref 150–400)
RBC: 3.62 MIL/uL — AB (ref 3.87–5.11)
RDW: 15.9 % — ABNORMAL HIGH (ref 11.5–15.5)
WBC: 9.2 10*3/uL (ref 4.0–10.5)

## 2016-11-01 LAB — ECHOCARDIOGRAM COMPLETE
HEIGHTINCHES: 66 in
WEIGHTICAEL: 3064 [oz_av]

## 2016-11-01 LAB — TROPONIN I

## 2016-11-01 LAB — BASIC METABOLIC PANEL
Anion gap: 6 (ref 5–15)
BUN: 23 mg/dL — AB (ref 6–20)
CHLORIDE: 101 mmol/L (ref 101–111)
CO2: 27 mmol/L (ref 22–32)
CREATININE: 0.85 mg/dL (ref 0.44–1.00)
Calcium: 9.7 mg/dL (ref 8.9–10.3)
GFR calc Af Amer: >60 mL/min (ref >60)
GFR calc non Af Amer: 60 mL/min — ABNORMAL LOW (ref >60)
GLUCOSE: 118 mg/dL — AB (ref 65–99)
POTASSIUM: 4.4 mmol/L (ref 3.5–5.1)
Sodium: 134 mmol/L — ABNORMAL LOW (ref 135–145)

## 2016-11-01 LAB — BRAIN NATRIURETIC PEPTIDE: B Natriuretic Peptide: 500 pg/mL — ABNORMAL HIGH (ref 0.0–100.0)

## 2016-11-01 LAB — I-STAT TROPONIN, ED: TROPONIN I, POC: 0.01 ng/mL (ref 0.00–0.08)

## 2016-11-01 LAB — HEPATIC FUNCTION PANEL
ALBUMIN: 3.5 g/dL (ref 3.5–5.0)
ALK PHOS: 80 U/L (ref 38–126)
ALT: 15 U/L (ref 14–54)
AST: 30 U/L (ref 15–41)
BILIRUBIN INDIRECT: 0.5 mg/dL (ref 0.3–0.9)
Bilirubin, Direct: 0.2 mg/dL (ref 0.1–0.5)
TOTAL PROTEIN: 6.3 g/dL — AB (ref 6.5–8.1)
Total Bilirubin: 0.7 mg/dL (ref 0.3–1.2)

## 2016-11-01 LAB — D-DIMER, QUANTITATIVE (NOT AT ARMC): D DIMER QUANT: 0.81 ug{FEU}/mL — AB (ref 0.00–0.50)

## 2016-11-01 MED ORDER — VERAPAMIL HCL ER 240 MG PO CP24: 240.0000 mg | ORAL_CAPSULE | Freq: Every day | ORAL | Status: DC

## 2016-11-01 MED ORDER — LISINOPRIL 5 MG PO TABS
5.0000 mg | ORAL_TABLET | Freq: Every day | ORAL | Status: DC
  Administered 2016-11-01 – 2016-11-02 (×2): 5 mg via ORAL
  Filled 2016-11-01 (×2): qty 1

## 2016-11-01 MED ORDER — ALBUTEROL SULFATE (2.5 MG/3ML) 0.083% IN NEBU: 2.5000 mg | INHALATION_SOLUTION | RESPIRATORY_TRACT | Status: DC | PRN

## 2016-11-01 MED ORDER — APIXABAN 2.5 MG PO TABS
2.5000 mg | ORAL_TABLET | Freq: Two times a day (BID) | ORAL | Status: DC
  Administered 2016-11-01 – 2016-11-02 (×3): 2.5 mg via ORAL
  Filled 2016-11-01 (×3): qty 1

## 2016-11-01 MED ORDER — SPIRONOLACTONE 25 MG PO TABS
25.0000 mg | ORAL_TABLET | Freq: Every day | ORAL | Status: DC
  Administered 2016-11-01 – 2016-11-02 (×2): 25 mg via ORAL
  Filled 2016-11-01 (×2): qty 1

## 2016-11-01 MED ORDER — ONDANSETRON HCL 4 MG/2ML IJ SOLN: 4.0000 mg | Freq: Four times a day (QID) | INTRAMUSCULAR | Status: DC | PRN

## 2016-11-01 MED ORDER — METHYLPREDNISOLONE SODIUM SUCC 125 MG IJ SOLR
60.0000 mg | Freq: Four times a day (QID) | INTRAMUSCULAR | Status: DC
  Administered 2016-11-01 – 2016-11-02 (×4): 60 mg via INTRAVENOUS
  Filled 2016-11-01 (×5): qty 2

## 2016-11-01 MED ORDER — PSYLLIUM 500 MG PO CAPS: 500.0000 mg | ORAL_CAPSULE | Freq: Every day | ORAL | Status: DC

## 2016-11-01 MED ORDER — FUROSEMIDE 10 MG/ML IJ SOLN
20.0000 mg | Freq: Once | INTRAMUSCULAR | Status: AC
  Administered 2016-11-01: 20 mg via INTRAVENOUS
  Filled 2016-11-01: qty 2

## 2016-11-01 MED ORDER — LIDOCAINE 4 % EX PTCH: 1.0000 | MEDICATED_PATCH | Freq: Two times a day (BID) | CUTANEOUS | Status: DC | PRN

## 2016-11-01 MED ORDER — ROSUVASTATIN CALCIUM 10 MG PO TABS
10.0000 mg | ORAL_TABLET | ORAL | Status: DC
  Administered 2016-11-01: 10 mg via ORAL
  Filled 2016-11-01 (×2): qty 1

## 2016-11-01 MED ORDER — PERFLUTREN LIPID MICROSPHERE
1.0000 mL | INTRAVENOUS | Status: AC | PRN
  Administered 2016-11-01: 2 mL via INTRAVENOUS
  Filled 2016-11-01: qty 10

## 2016-11-01 MED ORDER — LORATADINE 10 MG PO TABS
10.0000 mg | ORAL_TABLET | Freq: Every day | ORAL | Status: DC
  Administered 2016-11-01 – 2016-11-02 (×2): 10 mg via ORAL
  Filled 2016-11-01 (×2): qty 1

## 2016-11-01 MED ORDER — PSYLLIUM 95 % PO PACK
1.0000 | PACK | Freq: Every day | ORAL | Status: DC
  Administered 2016-11-01 – 2016-11-02 (×2): 1 via ORAL
  Filled 2016-11-01 (×3): qty 1

## 2016-11-01 MED ORDER — FUROSEMIDE 10 MG/ML IJ SOLN
40.0000 mg | Freq: Two times a day (BID) | INTRAMUSCULAR | Status: DC
  Administered 2016-11-01 – 2016-11-02 (×2): 40 mg via INTRAVENOUS
  Filled 2016-11-01 (×2): qty 4

## 2016-11-01 MED ORDER — ACETAMINOPHEN 325 MG PO TABS
650.0000 mg | ORAL_TABLET | ORAL | Status: DC | PRN
  Administered 2016-11-01: 650 mg via ORAL
  Filled 2016-11-01: qty 2

## 2016-11-01 MED ORDER — ATENOLOL 25 MG PO TABS
25.0000 mg | ORAL_TABLET | Freq: Every day | ORAL | Status: DC
  Administered 2016-11-01: 25 mg via ORAL
  Filled 2016-11-01 (×2): qty 1

## 2016-11-01 MED ORDER — SODIUM CHLORIDE 0.9% FLUSH: 3.0000 mL | INTRAVENOUS | Status: DC | PRN

## 2016-11-01 MED ORDER — HYDROCODONE-ACETAMINOPHEN 5-325 MG PO TABS
1.0000 | ORAL_TABLET | Freq: Three times a day (TID) | ORAL | Status: DC | PRN
  Administered 2016-11-01 – 2016-11-02 (×2): 1 via ORAL
  Filled 2016-11-01 (×2): qty 1

## 2016-11-01 MED ORDER — TRAMADOL HCL 50 MG PO TABS: 50.0000 mg | ORAL_TABLET | Freq: Four times a day (QID) | ORAL | Status: DC | PRN

## 2016-11-01 MED ORDER — FLUTICASONE PROPIONATE 50 MCG/ACT NA SUSP
1.0000 | Freq: Every day | NASAL | Status: DC
  Administered 2016-11-01: 1 via NASAL
  Filled 2016-11-01: qty 16

## 2016-11-01 MED ORDER — SODIUM CHLORIDE 0.9% FLUSH
3.0000 mL | Freq: Two times a day (BID) | INTRAVENOUS | Status: DC
  Administered 2016-11-01 – 2016-11-02 (×3): 3 mL via INTRAVENOUS

## 2016-11-01 MED ORDER — HYDRALAZINE HCL 20 MG/ML IJ SOLN: 10.0000 mg | Freq: Three times a day (TID) | INTRAMUSCULAR | Status: DC | PRN

## 2016-11-01 MED ORDER — VERAPAMIL HCL ER 240 MG PO TBCR
240.0000 mg | EXTENDED_RELEASE_TABLET | Freq: Every day | ORAL | Status: DC
  Administered 2016-11-01: 240 mg via ORAL
  Filled 2016-11-01 (×2): qty 1

## 2016-11-01 MED ORDER — LIDOCAINE 5 % EX PTCH: 1.0000 | MEDICATED_PATCH | Freq: Every day | CUTANEOUS | Status: DC | PRN

## 2016-11-01 MED ORDER — SODIUM CHLORIDE 0.9 % IV SOLN: 250.0000 mL | INTRAVENOUS | Status: DC | PRN

## 2016-11-01 MED ORDER — ALBUTEROL SULFATE (2.5 MG/3ML) 0.083% IN NEBU: 3.0000 mL | INHALATION_SOLUTION | RESPIRATORY_TRACT | Status: DC | PRN

## 2016-11-01 MED ORDER — ESCITALOPRAM OXALATE 10 MG PO TABS
10.0000 mg | ORAL_TABLET | Freq: Every day | ORAL | Status: DC
  Administered 2016-11-01 – 2016-11-02 (×2): 10 mg via ORAL
  Filled 2016-11-01 (×2): qty 1

## 2016-11-01 MED ORDER — SENNA 8.6 MG PO TABS
1.0000 | ORAL_TABLET | ORAL | Status: DC
  Administered 2016-11-02: 8.6 mg via ORAL
  Filled 2016-11-01: qty 1

## 2016-11-01 NOTE — H&P (Addendum)
[facility] History and Physical  DACY ENRICO GEX:[id number] DOB: February 09, 1927 DOA: 11/01/2016  Referring physician:  PCP: Reymundo Poll, MD   Chief Complaint: sob  HPI: Alexandra Luna is a 83 y.o. female  with past medical history of anxiety, atrial fibrillation, breast cancer, diabetes and hypertension presents with chief complaint of shortness of breath. Patient states that this started acutely last night. Has not improved. Denies sick contacts. Denies fever and cough. No chest pain. Patient denies a chest trauma. She has been on her medications.   ED course: Patient given Lasix to treat for fluid overload.     Review of Systems:  As per HPI otherwise 10 point review of systems negative.    Past Medical History:  Diagnosis Date  . Anxiety   . Atrial fibrillation (HCC)    On chronic Eliquis  . Breast cancer ([place]) dx'd 1997   left; lt lumpectomy and XRT  . Mixed hyperlipidemia   . Palpitations   . Pre-diabetes   . Renal hypertension 03/07/2005   no evidence of significant diameter reduction, dissection, tortuosity, FMD or orther vascular abnormality; kidneys equal in size, symmetry, with normal cortex and medulla   Past Surgical History:  Procedure Laterality Date  . BREAST LUMPECTOMY    . BREAST LUMPECTOMY  1998   left side  . CARDIOVASCULAR STRESS TEST  526/2011   R/Dipyridamole - EF 71%; normal myocaridal perfusion w/o evidence of ischemia or infarct; no schintigraphic evidence of inducible myocaridal ischemia; global LV function normal; EKG negative for ischemia; low risk scan   . CARDIOVERSION N/A 11/12/2012   Procedure: CARDIOVERSION;  Surgeon: Troy Sine, MD;  Location: [facility];  Service: Cardiovascular;  Laterality: N/A;  . CESAREAN SECTION     x 3  . DOPPLER ECHOCARDIOGRAPHY  08/29/2011   EF >01%;  LV systolic fcn normal; doppler flow pattern suggestive of impaired LV relaxation; LA mildly dilated; aortic valve mildly sclerotic, no aortic stenosis  .  tonsillectomy     Social History:  reports that she quit smoking about 28 years ago. Her smoking use included Cigarettes. She has a 7.50 pack-year smoking history. She has never used smokeless tobacco. She reports that she drinks alcohol. She reports that she does not use drugs.  Allergies  Allergen Reactions  . Influenza Vaccines Other (See Comments)    Pt's arm swelled and dr told her not to take vaccine again.  [place] Fleeting [Trandolapril-Verapamil Hcl Er] Other (See Comments)    Increase blood pressure  . Adhesive [Tape] Rash    Adhesive bandaids   . Azithromycin Rash  . Lipitor [Atorvastatin] Rash  . Penicillins Rash    Family History  Problem Relation Age of Onset  . Heart failure Mother   . Breast cancer Mother   . Hypertension Father   . Kidney cancer Father   . Stroke Maternal Grandmother      Prior to Admission medications   Medication Sig Start Date End Date Taking? Authorizing Provider  acetaminophen (TYLENOL) 325 MG tablet Take 650 mg by mouth 2 (two) times daily as needed for mild pain.   Yes [provider]  albuterol (PROVENTIL HFA;VENTOLIN HFA) 108 (90 Base) MCG/ACT inhaler Inhale 1 puff into the lungs every 4 (four) hours as needed for wheezing or shortness of breath. 04/03/15  Yes Nita Sells, MD  apixaban (ELIQUIS) 2.5 MG TABS tablet Take 1 tablet (2.5 mg total) by mouth 2 (two) times daily. 12/23/13  Yes Troy Sine, MD  atenolol (TENORMIN) 25 MG tablet Take 1 tablet (25 mg total) by mouth at bedtime. 07/01/14  Yes Robbie Lis, MD  calcium carbonate (OS-CAL) 600 MG TABS Take 600 mg by mouth daily.    Yes [provider]  escitalopram (LEXAPRO) 10 MG tablet Take 10 mg by mouth daily.   Yes [provider]  fexofenadine (ALLEGRA) 180 MG tablet Take 180 mg by mouth daily.   Yes [provider]  fluticasone (FLONASE) 50 MCG/ACT nasal spray Place 1 spray into both nostrils at bedtime.  07/04/12  Yes [provider]    furosemide (LASIX) 20 MG tablet Take 1 tablet (20 mg total) by mouth daily. 08/15/14  Yes Robbie Lis, MD  HYDROcodone-acetaminophen (NORCO/VICODIN) 5-325 MG tablet Take 1 tablet by mouth every 8 (eight) hours as needed for moderate pain.   Yes [provider]  Lidocaine 4 % PTCH Apply 1 patch topically 2 (two) times daily as needed (pain).   Yes [provider]  Menthol, Topical Analgesic, (BIOFREEZE) 4 % GEL Apply 1 application topically 3 (three) times daily.   Yes [provider]  metoCLOPramide (REGLAN) 10 MG tablet Take 1 tablet (10 mg total) by mouth every 8 (eight) hours as needed for nausea. 03/21/15  Yes Harvel Quale, MD  Multiple Vitamins-Iron (MULTIVITAMINS WITH IRON) TABS tablet Take 1 tablet by mouth daily.   Yes [provider]  Multiple Vitamins-Minerals (EYE-VITES PO) Take 1 tablet by mouth 2 (two) times daily.   Yes [provider]  ondansetron (ZOFRAN) 4 MG tablet Take 1 tablet (4 mg total) by mouth every 6 (six) hours as needed for nausea. 08/15/14  Yes Robbie Lis, MD  Psyllium 500 MG CAPS Take 500 mg by mouth daily.   Yes [provider]  rosuvastatin (CRESTOR) 10 MG tablet Take 1 tablet (10 mg total) by mouth every other day. 05/10/14  Yes Troy Sine, MD  senna (SENOKOT) 8.6 MG TABS tablet Take 1 tablet by mouth 3 (three) times a week. Monday, Wednesday and Friday   Yes [provider]  Spacer/Aero-Holding Chambers (AEROCHAMBER PLUS FLO-VU MEDIUM) MISC 1 each by Other route once. 04/03/15  Yes Nita Sells, MD  spironolactone (ALDACTONE) 25 MG tablet Take 1 tablet by mouth daily. 02/17/15  Yes [provider]  traMADol (ULTRAM) 50 MG tablet Take 1 tablet (50 mg total) by mouth every 6 (six) hours as needed. Patient taking differently: Take 50 mg by mouth every 6 (six) hours as needed for moderate pain.  08/25/16  Yes Milton Ferguson, MD  verapamil (VERELAN PM) 240 MG 24 hr capsule Take 1  capsule (240 mg total) by mouth at bedtime. 05/03/14  Yes Troy Sine, MD   Physical Exam: Vitals:   11/01/16 0700 11/01/16 0715 11/01/16 0830 11/01/16 0833  BP: 111/89 129/78 130/72 (!) 144/78  Pulse:  79 88 80  Resp: 18 16 19 18   Temp:      TempSrc:      SpO2:  99% 100% 99%    Wt Readings from Last 3 Encounters:  09/07/16 87.7 kg (193 lb 5.5 oz)  08/25/16 87.3 kg (192 lb 8 oz)  04/01/15 83 kg (183 lb)    General:  Appears calm and comfortable; Alert and oriented 3 Eyes:  PERRL, EOMI, normal lids, iris ENT:  grossly normal hearing, lips & tongue Neck:  no LAD, masses or thyromegaly Cardiovascular:  RRR, no m/r/g. No LE edema.  Respiratory:  CTA bilaterally,  visibly short of breath, increased work of breathing, tachypnea Abdomen:  soft, ntnd Skin:  no rash or induration seen on limited exam Musculoskeletal:  grossly normal tone BUE/BLE Psychiatric:  grossly normal mood and affect, speech fluent and appropriate Neurologic:  CN 2-12 grossly intact, moves all extremities in coordinated fashion.          Labs on Admission:  Basic Metabolic Panel:  Recent Labs Lab 11/01/16 0540  NA 134*  K 4.4  CL 101  CO2 27  GLUCOSE 118*  BUN 23*  CREATININE 0.85  CALCIUM 9.7   Liver Function Tests: No results for input(s): AST, ALT, ALKPHOS, BILITOT, PROT, ALBUMIN in the last 168 hours. No results for input(s): LIPASE, AMYLASE in the last 168 hours. No results for input(s): AMMONIA in the last 168 hours. CBC:  Recent Labs Lab 11/01/16 0540  WBC 9.2  NEUTROABS 7.0  HGB 10.9*  HCT 33.9*  MCV 93.6  PLT 223   Cardiac Enzymes: No results for input(s): CKTOTAL, CKMB, CKMBINDEX, TROPONINI in the last 168 hours.  BNP (last 3 results)  Recent Labs  09/05/16 1309 11/01/16 0540  BNP 181.6* 500.0*    ProBNP (last 3 results) No results for input(s): PROBNP in the last 8760 hours.   Creatinine clearance cannot be calculated (Unknown ideal weight.)  CBG: No results  for input(s): GLUCAP in the last 168 hours.  Radiological Exams on Admission: Dg Chest 2 View  Result Date: 11/01/2016 CLINICAL DATA:  Initial evaluation for acute shortness of breath. EXAM: CHEST  2 VIEW COMPARISON:  Prior radiograph from 09/05/2016. FINDINGS: Moderate cardiomegaly. Mediastinal silhouette within normal limits. Aortic atherosclerosis. Lungs normally inflated. Diffuse interstitial prominence, greatest within the lung bases, suggesting mild pulmonary interstitial edema. Probable small bilateral pleural effusions. No definite focal infiltrates. No pneumothorax. Age indeterminate wedging of the superior endplate of L2. No other acute osseous abnormality. Surgical clips overlie the left axilla. IMPRESSION: 1. Cardiomegaly with mild diffuse pulmonary interstitial edema. 2. Aortic atherosclerosis. 3. Wedging of the superior endplate of L2, age indeterminate. Correlation physical exam recommended. Electronically Signed   By: Jeannine Boga M.D.   On: 11/01/2016 05:31    EKG: Independently reviewed. Atrial fibrillation. No STEMI.  Assessment/Plan Active Problems:   CHF exacerbation (HCC)  Acute heart failure Serial troponin, initial neg Diuresis Fluid seen on cxr Echo tomorrow Ins and outs Continuous pulse ox Continue Aldactone  SOB With diuresis no much improvement Trial of steroids albuterol RT consult Wells score for PE of 0  Hypertension Continue atenolol, verapamil When necessary hydralazine  Fibrillation Continue Eliquis  Depression Continue Lexapro  Allergies Continue Allegra-->claritin, Flonase  Chronic pain Continue Norco, lidocaine, tramadol  Constipation continue Senokot, psyllium  Continue albuterol  Lipidemia Continue Crestor  Code Status: DNR DVT Prophylaxis: Eliquis Family Communication: none Disposition Plan: Pending Improvement  Status: inpt tele  Elwin Mocha, MD Family Medicine [facility] [email or web address] Password  [email or web address]

## 2016-11-01 NOTE — Progress Notes (Signed)
New pt admission from ED. Pt brought to the floor in stable condition. Vitals taken. Initial Assessment done. All immediate pertinent needs to patient addressed. Patient Guide given to patient. Important safety instructions relating to hospitalization reviewed with patient. Patient verbalized understanding. Will continue to monitor pt. 

## 2016-11-01 NOTE — Progress Notes (Signed)
  Echocardiogram 2D Echocardiogram has been performed.  Cahterine Heinzel L Androw 11/01/2016, 4:31 PM

## 2016-11-01 NOTE — Progress Notes (Signed)
Rn noticed pt is having very little rectal bleeding while having Bowel movement and pt said that she is having hemorrhoids, will notify MD for medicine.

## 2016-11-01 NOTE — Plan of Care (Signed)
Alexandra Luna is a 83-year-old female with pmh HTN, HLD, A. Fib on Eliquis, COPD, and reported CHF although last EF within normal limits in 2016; presents with complaints of shortness of breath on exertion. Chest x-ray showing pulmonary edema. O2 sats noted in the upper 80s to low 90s with EMS. BNP pending. Initially given 20 mg of Lasix IV in the emergency department. Will likely need admission for what appears to be a CHF exacerbation and churning of cardiac enzymes.

## 2016-11-01 NOTE — ED Triage Notes (Signed)
Per EMS pt has history of COPD.  Not on home O2 at this time.  Pt was ambulating to the restroom tonight and experienced increased SHOB and took more time to catch her breath after sitting down. Pt has history of AFIB.  Pt has no other complaints at this time. Sats on arrival with fire dept were 91% on room air.  On 2L [place] pt is 99% at this time. Speaking in complete sentences, no distress.

## 2016-11-01 NOTE — ED Notes (Signed)
lunch tray delivered

## 2016-11-01 NOTE — Progress Notes (Signed)
MRSA PCR collected

## 2016-11-01 NOTE — ED Provider Notes (Signed)
TIME SEEN: 4:49 AM  CHIEF COMPLAINT: Shortness of breath  HPI: Patient is an 83 year old female with history of atrial fibrillation on Eliquis, hypertension, hyperlipidemia, reports of COPD and CHF who presents emergency department with shortness of breath. States has been ongoing for the past year and is worse with exertion but has been progressively getting worse and was even worse tonight where she felt she could not breathe at all. No chest pain or chest discomfort. States she has been told recently that her oxygen saturation has been low in the low 90s. She does not wear oxygen chronically. No lower extremity swelling or pain. No fevers or cough. No nausea, vomiting. No diaphoresis. Patient lives at [facility].  ROS: See HPI Constitutional: no fever  Eyes: no drainage  ENT: no runny nose   Cardiovascular:  no chest pain  Resp:  SOB  GI: no vomiting GU: no dysuria Integumentary: no rash  Allergy: no hives  Musculoskeletal: no leg swelling  Neurological: no slurred speech ROS otherwise negative  PAST MEDICAL HISTORY/PAST SURGICAL HISTORY:  Past Medical History:  Diagnosis Date  . Anxiety   . Atrial fibrillation (HCC)    On chronic Eliquis  . Breast cancer ([place]) dx'd 1997   left; lt lumpectomy and XRT  . Mixed hyperlipidemia   . Palpitations   . Pre-diabetes   . Renal hypertension 03/07/2005   no evidence of significant diameter reduction, dissection, tortuosity, FMD or orther vascular abnormality; kidneys equal in size, symmetry, with normal cortex and medulla    MEDICATIONS:  Prior to Admission medications   Medication Sig Start Date End Date Taking? Authorizing Provider  acetaminophen (TYLENOL) 325 MG tablet Take 650 mg by mouth 2 (two) times daily as needed for mild pain.    [provider]  albuterol (PROVENTIL HFA;VENTOLIN HFA) 108 (90 Base) MCG/ACT inhaler Inhale 1 puff into the lungs every 4 (four) hours as needed for wheezing or shortness of breath.  04/03/15   Nita Sells, MD  apixaban (ELIQUIS) 2.5 MG TABS tablet Take 1 tablet (2.5 mg total) by mouth 2 (two) times daily. 12/23/13   Troy Sine, MD  atenolol (TENORMIN) 25 MG tablet Take 1 tablet (25 mg total) by mouth at bedtime. 07/01/14   Robbie Lis, MD  calcium carbonate (OS-CAL) 600 MG TABS Take 600 mg by mouth daily.     [provider]  fluticasone (FLONASE) 50 MCG/ACT nasal spray Place 1 spray into both nostrils at bedtime.  07/04/12   [provider]  furosemide (LASIX) 20 MG tablet Take 1 tablet (20 mg total) by mouth daily. 08/15/14   Robbie Lis, MD  metoCLOPramide (REGLAN) 10 MG tablet Take 1 tablet (10 mg total) by mouth every 8 (eight) hours as needed for nausea. Patient not taking: Reported on 09/05/2016 03/21/15   Harvel Quale, MD  Multiple Vitamins-Iron (MULTIVITAMINS WITH IRON) TABS tablet Take 1 tablet by mouth daily.    [provider]  ondansetron (ZOFRAN) 4 MG tablet Take 1 tablet (4 mg total) by mouth every 6 (six) hours as needed for nausea. Patient not taking: Reported on 05/12/2015 08/15/14   Robbie Lis, MD  rosuvastatin (CRESTOR) 10 MG tablet Take 1 tablet (10 mg total) by mouth every other day. 05/10/14   Troy Sine, MD  Spacer/Aero-Holding Chambers (AEROCHAMBER PLUS FLO-VU MEDIUM) MISC 1 each by Other route once. 04/03/15   Nita Sells, MD  spironolactone (ALDACTONE) 25 MG tablet Take 1 tablet by mouth daily.  02/17/15   [provider]  traMADol (ULTRAM) 50 MG tablet Take 1 tablet (50 mg total) by mouth every 6 (six) hours as needed. 08/25/16   Milton Ferguson, MD  verapamil (VERELAN PM) 240 MG 24 hr capsule Take 1 capsule (240 mg total) by mouth at bedtime. 05/03/14   Troy Sine, MD    ALLERGIES:  Allergies  Allergen Reactions  . Influenza Vaccines Other (See Comments)    Pt's arm swelled and dr told her not to take vaccine again.  [place] Fleeting [Trandolapril-Verapamil Hcl Er] Other (See Comments)     Increase blood pressure  . Adhesive [Tape] Rash    Adhesive bandaids   . Azithromycin Rash  . Lipitor [Atorvastatin] Rash  . Penicillins Rash    SOCIAL HISTORY:  Social History  Substance Use Topics  . Smoking status: Former Smoker    Packs/day: 0.75    Years: 10.00    Types: Cigarettes    Quit date: 04/02/1988  . Smokeless tobacco: Never Used  . Alcohol use 0.0 oz/week     Comment: 1/4 glass of wine every 2 weeks    FAMILY HISTORY: Family History  Problem Relation Age of Onset  . Heart failure Mother   . Breast cancer Mother   . Hypertension Father   . Kidney cancer Father   . Stroke Maternal Grandmother     EXAM: BP 133/86 (BP Location: Right Arm)   Pulse 70   Temp 97.9 F (36.6 C) (Oral)   Resp 18   SpO2 97%  CONSTITUTIONAL: Alert and oriented and responds appropriately to questions. Elderly, afebrile, nontoxic, chronically ill-appearing HEAD: Normocephalic EYES: Conjunctivae clear, pupils appear equal, EOMI ENT: normal nose; moist mucous membranes NECK: Supple, no meningismus, no nuchal rigidity, no LAD  CARD: Irregularly irregular but rate controlled; S1 and S2 appreciated; no murmurs, no clicks, no rubs, no gallops RESP: Normal chest excursion without splinting or tachypnea; breath sounds clear and equal bilaterally; no wheezes, no rhonchi, no rales, no hypoxia or respiratory distress, speaking full sentences ABD/GI: Normal bowel sounds; non-distended; soft, non-tender, no rebound, no guarding, no peritoneal signs, no hepatosplenomegaly BACK:  The back appears normal and is non-tender to palpation, there is no CVA tenderness EXT: Normal ROM in all joints; non-tender to palpation; no edema; normal capillary refill; no cyanosis, no calf tenderness or swelling    SKIN: Normal color for age and race; warm; no rash NEURO: Moves all extremities equally PSYCH: The patient's mood and manner are appropriate. Grooming and personal hygiene are appropriate.  MEDICAL  DECISION MAKING: Patient here with progressively worsening shortness of breath with exertion. Her lungs are clear at this time. She is not hypoxic. We'll obtain labs, chest x-ray, EKG. This could be her anginal equivalent.  ED PROGRESS: Patient's chest x-ray shows diffuse pulmonary edema. She is on 20 mg of Lasix daily. We'll give 20 mg IV and reassess. Her d-dimer is 0.81 which is a negative age-adjusted d-dimer.  Troponin is negative. Will discuss with hospitalist for admission for ACS rule out and admission for IV diuresis.   6:56 AM Discussed patient's case with hospitalist, Dr. Tamala Julian.  I have recommended admission and patient (and family if present) agree with this plan. Admitting physician will place admission orders.    Patient is diuresing some. We'll give a second dose of IV Lasix 20 mg.  I reviewed all nursing notes, vitals, pertinent previous records, EKGs, lab and urine results, imaging (as available).    EKG Interpretation  Date/Time:  Thursday November 01 2016 04:33:51 EDT Ventricular Rate:  64 PR Interval:    QRS Duration: 98 QT Interval:  426 QTC Calculation: 440 R Axis:   112 Text Interpretation:  Atrial fibrillation Right axis deviation Low voltage, precordial leads Confirmed by Ward, Cyril Mourning [phone]) on 11/01/2016 4:50:11 AM          Ward, Delice Bison, DO 11/01/16 3794

## 2016-11-01 NOTE — Care Management Note (Signed)
Case Management Note  Patient Details  Name: Alexandra Luna MRN: [id number] Date of Birth: 08-21-1926  Subjective/Objective:                  83 year old female from [facility] with history of atrial fibrillation on Eliquis, hypertension, hyperlipidemia, reports of COPD and CHF who presents emergency department with shortness of breath.   Action/Plan: Admit status INPATIENT (CHF exacerbation); anticipate discharge ALF .   Expected Discharge Date:   (unknown)               Expected Discharge Plan:  Assisted Living / Rest Home  In-House Referral:  Clinical Social Work  Discharge planning Services  CM Consult  Post Acute Care Choice:    Choice offered to:     DME Arranged:    DME Agency:     HH Arranged:    HH Agency:     Status of Service:  In process, will continue to follow  If discussed at Long Length of Stay Meetings, dates discussed:    Additional Comments: Pt currently has home health with [facility] through ALF. Fuller Mandril, RN 11/01/2016, 11:51 AM

## 2016-11-01 NOTE — ED Notes (Signed)
Went to see if pt can ambulate in hall pt stated that she feel to weak to ambulate. Pt also stated that her walker that she uses has a seat where she can sit down and take a break when walking.

## 2016-11-02 DIAGNOSIS — R0609 Other forms of dyspnea: Secondary | ICD-10-CM

## 2016-11-02 DIAGNOSIS — J810 Acute pulmonary edema: Secondary | ICD-10-CM

## 2016-11-02 LAB — BASIC METABOLIC PANEL
Anion gap: 8 (ref 5–15)
BUN: 24 mg/dL — AB (ref 6–20)
CHLORIDE: 97 mmol/L — AB (ref 101–111)
CO2: 27 mmol/L (ref 22–32)
Calcium: 9.8 mg/dL (ref 8.9–10.3)
Creatinine, Ser: 0.91 mg/dL (ref 0.44–1.00)
GFR calc Af Amer: >60 mL/min (ref >60)
GFR, EST NON AFRICAN AMERICAN: 55 mL/min — AB (ref >60)
Glucose, Bld: 175 mg/dL — ABNORMAL HIGH (ref 65–99)
POTASSIUM: 3.8 mmol/L (ref 3.5–5.1)
Sodium: 132 mmol/L — ABNORMAL LOW (ref 135–145)

## 2016-11-02 LAB — MRSA PCR SCREENING: MRSA by PCR: POSITIVE — AB

## 2016-11-02 LAB — TROPONIN I

## 2016-11-02 MED ORDER — CHLORHEXIDINE GLUCONATE CLOTH 2 % EX PADS
6.0000 | MEDICATED_PAD | Freq: Every day | CUTANEOUS | Status: DC
  Administered 2016-11-02: 6 via TOPICAL

## 2016-11-02 MED ORDER — MUPIROCIN 2 % EX OINT
1.0000 "application " | TOPICAL_OINTMENT | Freq: Two times a day (BID) | CUTANEOUS | Status: DC
  Administered 2016-11-02: 1 via NASAL
  Filled 2016-11-02: qty 22

## 2016-11-02 NOTE — Care Management CC44 (Signed)
Condition Code 44 Documentation Completed  Patient Details  Name: Alexandra Luna MRN: [id number] Date of Birth: [birth year]   Condition Code 44 given:  Yes Patient signature on Condition Code 44 notice:  Yes Documentation of 2 MD's agreement:  Yes Code 44 added to claim:  Yes    Royston Bake, RN 11/02/2016, 3:42 PM

## 2016-11-02 NOTE — Clinical Social Work Note (Signed)
Clinical Social Work Assessment  Patient Details  Name: Alexandra Luna MRN: [id number] Date of Birth: 1926-07-30  Date of referral:  11/02/16               Reason for consult:  Discharge Planning                Permission sought to share information with:  Facility Sport and exercise psychologist, Family Supports Permission granted to share information::  Yes, Verbal Permission Granted  Name::     Alexandra Luna  Agency::  [facility]  Relationship::  Daughter  Contact Information:  [phone]  Housing/Transportation Living arrangements for the past 2 months:  [place] of Information:  Patient, Medical Team Patient Interpreter Needed:  None Criminal Activity/Legal Involvement Pertinent to Current Situation/Hospitalization:  No - Comment as needed Significant Relationships:  Adult Children Lives with:  Facility Resident Do you feel safe going back to the place where you live?  Yes Need for family participation in patient care:  Yes (Comment)  Care giving concerns:  Patient is a resident at [facility].   Social Worker assessment / plan:  CSW met with patient. No supports at bedside. CSW introduced role and explained that discharge planning would be discussed. Patient confirmed that she is from [facility] and plans to return once discharged. She prefers for her daughter, [name], to transport her back to the facility when stable but if she is unable to she is agreeable to [facility]. No further concerns. CSW encouraged patient to contact CSW as needed. CSW will continue to follow patient for support and facilitate discharge back to ALF once medically stable.  Employment status:  Retired Nurse, adult PT Recommendations:  No Follow Up Information / Referral to community resources:  Other (Comment Required) (Plan is to return to ALF)  Patient/Family's Response to care:  Patient agreeable to return to ALF. Patient's  daughters supportive and involved in patient's care. Patient appreciated social work intervention.  Patient/Family's Understanding of and Emotional Response to Diagnosis, Current Treatment, and Prognosis:  Patient has a good understanding of the reason for admission and her need to return to ALF once stable for discharge. Patient appears happy with hospital care.  Emotional Assessment Appearance:  Appears stated age Attitude/Demeanor/Rapport:  Other (Pleasant) Affect (typically observed):  Accepting, Appropriate, Calm, Pleasant Orientation:  Oriented to Self, Oriented to Place, Oriented to  Time, Oriented to Situation Alcohol / Substance use:  Never Used Psych involvement (Current and /or in the community):  No (Comment)  Discharge Needs  Concerns to be addressed:  Care Coordination Readmission within the last 30 days:  No Current discharge risk:  None Barriers to Discharge:  Continued Medical Work up   Candie Chroman, LCSW 11/02/2016, 12:15 PM

## 2016-11-02 NOTE — Progress Notes (Signed)
Refused 2 am dose of Solu-Medrol because she doesn't want anybody waking her at night. I explained that she needs the medications that her doctor ordered and she said, "I don't care."

## 2016-11-02 NOTE — Evaluation (Signed)
Physical Therapy Evaluation Patient Details Name: Alexandra Luna MRN: [id number] DOB: 30-Jul-1926 Today's Date: 11/02/2016   History of Present Illness  Patient is a 83 y/o female who presents with SOB. Admitted with CHF exacerbation. PMH includes A-fib, HTN, anxiety, COPD, breast ca.   Clinical Impression  Patient presents with dyspnea on exertion, decreased endurance, impaired balance and questionable safety awareness s/p above. Tolerated gait training with Min guard assist for safety. 3 standing rest breaks needed due to 2-3/4 DOE. Pt reports this is much improved. Pt from [facility]. Reports some falls. Pt has support from staff at [facility]. HR ranging from 60-86 bpm. Will follow acutely to maximize independence and mobility prior to return home.     Follow Up Recommendations No PT follow up;Supervision for mobility/OOB    Equipment Recommendations  None recommended by PT    Recommendations for Other Services OT consult     Precautions / Restrictions Precautions Precautions: Fall Restrictions Weight Bearing Restrictions: No      Mobility  Bed Mobility Overal bed mobility: Needs Assistance Bed Mobility: Sit to Supine       Sit to supine: Modified independent (Device/Increase time);HOB elevated   General bed mobility comments: Sitting EOB upon PT arrival. No assist to return to supine.   Transfers Overall transfer level: Needs assistance Equipment used: Rolling walker (2 wheeled) Transfers: Sit to/from Stand Sit to Stand: Min guard         General transfer comment: Min guard for safety. Stood from [employer].  Ambulation/Gait Ambulation/Gait assistance: Min guard Ambulation Distance (Feet): 150 Feet Assistive device: Rolling walker (2 wheeled) Gait Pattern/deviations: Step-through pattern;Decreased stride length;Trunk flexed Gait velocity: decreased   General Gait Details: Slow, unsteady gait. 3 standing rest breaks. 2-3/4 DOE. HR ranged from 60s-86 bpm during  activity.   Stairs            Wheelchair Mobility    Modified Rankin (Stroke Patients Only)       Balance Overall balance assessment: Needs assistance;History of Falls Sitting-balance support: Feet supported;No upper extremity supported Sitting balance-Leahy Scale: Fair Sitting balance - Comments: Needs assist with donning underwear.    Standing balance support: During functional activity;Bilateral upper extremity supported Standing balance-Leahy Scale: Poor Standing balance comment: Reliant on UEs for support in standing. Able to pull up underwear once over feet without LOB, but close to bed.                             Pertinent Vitals/Pain Pain Assessment: Faces Faces Pain Scale: Hurts a little bit Pain Location: back- chronic Pain Descriptors / Indicators: Aching;Sore Pain Intervention(s): Monitored during session;Repositioned    Home Living Family/patient expects to be discharged to:: Assisted living               Home Equipment: Gilford Rile - 4 wheels Additional Comments: Pt lives at [facility]    Prior Function Level of Independence: Needs assistance   Gait / Transfers Assistance Needed: mod I with Rollator     Comments: Likes to color, do puzzles, read. Reports some falls recently.     Hand Dominance   Dominant Hand: Right    Extremity/Trunk Assessment   Upper Extremity Assessment Upper Extremity Assessment: Defer to OT evaluation    Lower Extremity Assessment Lower Extremity Assessment: Generalized weakness    Cervical / Trunk Assessment Cervical / Trunk Assessment: Kyphotic  Communication   Communication: No difficulties  Cognition Arousal/Alertness: Awake/alert  Behavior During Therapy: WFL for tasks assessed/performed Overall Cognitive Status: No family/caregiver present to determine baseline cognitive functioning                                 General Comments: Questionable safety awareness. Seen  transferring off BSC to get back to bed, urine on floor and toilet a mess.       General Comments      Exercises     Assessment/Plan    PT Assessment Patient needs continued PT services  PT Problem List Decreased strength;Decreased mobility;Decreased balance;Decreased cognition;Decreased activity tolerance;Cardiopulmonary status limiting activity;Decreased safety awareness       PT Treatment Interventions Therapeutic activities;Gait training;Therapeutic exercise;Patient/family education;Balance training;Functional mobility training    PT Goals (Current goals can be found in the Care Plan section)  Acute Rehab PT Goals Patient Stated Goal: to go home and be able to breathe better PT Goal Formulation: With patient Time For Goal Achievement: 11/16/16 Potential to Achieve Goals: Good    Frequency Min 3X/week   Barriers to discharge        Co-evaluation               AM-PAC PT "6 Clicks" Daily Activity  Outcome Measure Difficulty turning over in bed (including adjusting bedclothes, sheets and blankets)?: None Difficulty moving from lying on back to sitting on the side of the bed? : None Difficulty sitting down on and standing up from a chair with arms (e.g., wheelchair, bedside commode, etc,.)?: None Help needed moving to and from a bed to chair (including a wheelchair)?: A Little Help needed walking in hospital room?: A Little Help needed climbing 3-5 steps with a railing? : [employer] 6 Click Score: 12    End of Session Equipment Utilized During Treatment: Gait belt Activity Tolerance: Patient limited by fatigue Patient left: in bed;with call bell/phone within reach;with bed alarm set Nurse Communication: Mobility status PT Visit Diagnosis: Unsteadiness on feet (R26.81);Muscle weakness (generalized) (M62.81);Other (comment) (dyspnea)    Time: 1130-1147 PT Time Calculation (min) (ACUTE ONLY): 17 min   Charges:   PT Evaluation $PT Eval Moderate Complexity: 1 Mod      PT G CodesWray Kearns, PT, DPT [phone]    Marguarite Arbour A Allona Gondek 11/02/2016, 12:00 PM

## 2016-11-02 NOTE — NC FL2 (Signed)
[place] LEVEL OF CARE SCREENING TOOL     IDENTIFICATION  Patient Name: Alexandra Luna Birthdate: 13-Nov-1926 Sex: female Admission Date (Current Location): 11/01/2016  [facility] and [place] Number:  Herbalist and Address:  [facility] [place], [place] [address] [place], [place] [address]      Provider Number: [id number]  Attending Physician Name and Address:  Velvet Bathe, MD  Relative Name and Phone Number:       Current Level of Care: Hospital Recommended Level of Care: [place] Prior Approval Number:    Date Approved/Denied:   PASRR Number:    Discharge Plan: Other (Comment) (ALF)    Current Diagnoses: Patient Active Problem List   Diagnosis Date Noted  . CHF exacerbation ([place]) 11/01/2016  . Weakness 09/06/2016  . Chronic diastolic CHF (congestive heart failure) ([place]) 09/06/2016  . ARF (acute renal failure) ([place]) 09/06/2016  . Weakness generalized 09/06/2016  . COPD exacerbation ([place]) 04/01/2015  . Bilateral pleural effusion 08/30/2014  . Multiple pulmonary nodules 08/30/2014  . Acute diastolic congestive heart failure ([place]) 08/14/2014  . Acute respiratory failure with hypoxia ([place]) 08/14/2014  . Generalized weakness 06/28/2014  . Chronic anticoagulation 12/08/2012  . AF (atrial fibrillation) ([place]) 09/22/2012  . Essential hypertension 09/22/2012  . Hyperlipidemia, mixed 09/22/2012  . History of breast cancer in female 04/28/2012    Orientation RESPIRATION BLADDER Height & Weight     Self, Time, Situation, Place  Normal Continent Weight: 187 lb 9.6 oz (85.1 kg) Height:  5\' 6"  (167.6 cm)  BEHAVIORAL SYMPTOMS/MOOD NEUROLOGICAL BOWEL NUTRITION STATUS   (None)  (None) Continent Diet (Low sodium)  AMBULATORY STATUS COMMUNICATION OF NEEDS Skin   Limited Assist Verbally Normal                       Personal Care Assistance Level of Assistance              Functional Limitations Info  Sight,  Hearing, Speech Sight Info: Adequate Hearing Info: Adequate Speech Info: Adequate    SPECIAL CARE FACTORS FREQUENCY  Blood pressure                    Contractures Contractures Info: Not present    Additional Factors Info  Code Status, Allergies, Isolation Precautions Code Status Info: DNR Allergies Info: Influenza Vaccines, [place] (Trandolapril-verapamil Hcl Er), Adhesive (Tape), Azithromycin, Lipitor (Atorvastatin), Penicillins     Isolation Precautions Info: Contact: MRSA     Current Medications (11/02/2016):  This is the current hospital active medication list Current Facility-Administered Medications  Medication Dose Route Frequency Provider Last Rate Last Dose  . 0.9 %  sodium chloride infusion  250 mL Intravenous PRN Elwin Mocha, MD      . acetaminophen (TYLENOL) tablet 650 mg  650 mg Oral Q4H PRN Elwin Mocha, MD   650 mg at 11/01/16 2015  . albuterol (PROVENTIL) (2.5 MG/3ML) 0.083% nebulizer solution 2.5 mg  2.5 mg Nebulization Q2H PRN Elwin Mocha, MD      . apixaban Arne Cleveland) tablet 2.5 mg  2.5 mg Oral BID Elwin Mocha, MD   2.5 mg at 11/02/16 1012  . atenolol (TENORMIN) tablet 25 mg  25 mg Oral QHS Elwin Mocha, MD   25 mg at 11/01/16 2157  . Chlorhexidine Gluconate Cloth 2 % PADS 6 each  6 each Topical Q0600 Velvet Bathe, MD   6 each at 11/02/16 0930  .  escitalopram (LEXAPRO) tablet 10 mg  10 mg Oral Daily Elwin Mocha, MD   10 mg at 11/02/16 1012  . fluticasone (FLONASE) 50 MCG/ACT nasal spray 1 spray  1 spray Each Nare QHS Elwin Mocha, MD   1 spray at 11/01/16 2155  . furosemide (LASIX) injection 40 mg  40 mg Intravenous BID Elwin Mocha, MD   40 mg at 11/02/16 1011  . hydrALAZINE (APRESOLINE) injection 10 mg  10 mg Intravenous Q8H PRN Elwin Mocha, MD      . HYDROcodone-acetaminophen (NORCO/VICODIN) 5-325 MG per tablet 1 tablet  1 tablet Oral Q8H PRN Elwin Mocha, MD   1 tablet at 11/02/16 1032  . lidocaine (LIDODERM) 5  % 1 patch  1 patch Transdermal Daily PRN Elwin Mocha, MD      . lisinopril (PRINIVIL,ZESTRIL) tablet 5 mg  5 mg Oral Daily Elwin Mocha, MD   5 mg at 11/02/16 1011  . loratadine (CLARITIN) tablet 10 mg  10 mg Oral Daily Elwin Mocha, MD   10 mg at 11/02/16 1011  . methylPREDNISolone sodium succinate (SOLU-MEDROL) 125 mg/2 mL injection 60 mg  60 mg Intravenous Q6H Elwin Mocha, MD   60 mg at 11/02/16 1322  . mupirocin ointment (BACTROBAN) 2 % 1 application  1 application Nasal BID Velvet Bathe, MD   1 application at 69/67/89 1031  . ondansetron (ZOFRAN) injection 4 mg  4 mg Intravenous Q6H PRN Elwin Mocha, MD      . psyllium (HYDROCIL/METAMUCIL) packet 1 packet  1 packet Oral Daily Elwin Mocha, MD   1 packet at 11/02/16 1011  . rosuvastatin (CRESTOR) tablet 10 mg  10 mg Oral Saunders Revel, MD   10 mg at 11/01/16 1650  . senna (SENOKOT) tablet 8.6 mg  1 tablet Oral Once per day on Mon Wed Fri Elwin Mocha, MD   8.6 mg at 11/02/16 1011  . sodium chloride flush (NS) 0.9 % injection 3 mL  3 mL Intravenous Q12H Elwin Mocha, MD   3 mL at 11/02/16 1012  . sodium chloride flush (NS) 0.9 % injection 3 mL  3 mL Intravenous PRN Elwin Mocha, MD      . spironolactone (ALDACTONE) tablet 25 mg  25 mg Oral Daily Elwin Mocha, MD   25 mg at 11/02/16 1011  . traMADol (ULTRAM) tablet 50 mg  50 mg Oral Q6H PRN Elwin Mocha, MD      . verapamil (CALAN-SR) CR tablet 240 mg  240 mg Oral QHS Elwin Mocha, MD   240 mg at 11/01/16 2154     Discharge Medications: CONTINUE these medications which have NOT CHANGED   Details  acetaminophen (TYLENOL) 325 MG tablet Take 650 mg by mouth 2 (two) times daily as needed for mild pain.    albuterol (PROVENTIL HFA;VENTOLIN HFA) 108 (90 Base) MCG/ACT inhaler Inhale 1 puff into the lungs every 4 (four) hours as needed for wheezing or shortness of breath. Qty: 1 Inhaler, Refills: 0    apixaban (ELIQUIS) 2.5 MG TABS  tablet Take 1 tablet (2.5 mg total) by mouth 2 (two) times daily. Qty: 60 tablet, Refills: 6    atenolol (TENORMIN) 25 MG tablet Take 1 tablet (25 mg total) by mouth at bedtime. Qty: 30 tablet, Refills: 0    calcium carbonate (OS-CAL) 600 MG TABS Take 600 mg by mouth daily.    Associated Diagnoses: Breast CA ([place])  escitalopram (LEXAPRO) 10 MG tablet Take 10 mg by mouth daily.    fexofenadine (ALLEGRA) 180 MG tablet Take 180 mg by mouth daily.    fluticasone (FLONASE) 50 MCG/ACT nasal spray Place 1 spray into both nostrils at bedtime.     furosemide (LASIX) 20 MG tablet Take 1 tablet (20 mg total) by mouth daily. Qty: 30 tablet, Refills: 0    HYDROcodone-acetaminophen (NORCO/VICODIN) 5-325 MG tablet Take 1 tablet by mouth every 8 (eight) hours as needed for moderate pain.    Lidocaine 4 % PTCH Apply 1 patch topically 2 (two) times daily as needed (pain).    Menthol, Topical Analgesic, (BIOFREEZE) 4 % GEL Apply 1 application topically 3 (three) times daily.    metoCLOPramide (REGLAN) 10 MG tablet Take 1 tablet (10 mg total) by mouth every 8 (eight) hours as needed for nausea. Qty: 10 tablet, Refills: 0    Multiple Vitamins-Iron (MULTIVITAMINS WITH IRON) TABS tablet Take 1 tablet by mouth daily.    Multiple Vitamins-Minerals (EYE-VITES PO) Take 1 tablet by mouth 2 (two) times daily.    ondansetron (ZOFRAN) 4 MG tablet Take 1 tablet (4 mg total) by mouth every 6 (six) hours as needed for nausea. Qty: 20 tablet, Refills: 0    Psyllium 500 MG CAPS Take 500 mg by mouth daily.    rosuvastatin (CRESTOR) 10 MG tablet Take 1 tablet (10 mg total) by mouth every other day. Qty: 30 tablet, Refills: 5   Associated Diagnoses: Breast CA, unspecified laterality    senna (SENOKOT) 8.6 MG TABS tablet Take 1 tablet by mouth 3 (three) times a week. Monday, Wednesday and Friday    Spacer/Aero-Holding Chambers (AEROCHAMBER PLUS FLO-VU MEDIUM) MISC 1 each by Other route  once. Qty: 1 each, Refills: 0    spironolactone (ALDACTONE) 25 MG tablet Take 1 tablet by mouth daily.    traMADol (ULTRAM) 50 MG tablet Take 1 tablet (50 mg total) by mouth every 6 (six) hours as needed. Qty: 20 tablet, Refills: 0    verapamil (VERELAN PM) 240 MG 24 hr capsule Take 1 capsule (240 mg total) by mouth at bedtime. Qty: 90 capsule, Refills: 3       Relevant Imaging Results:  Relevant Lab Results:   Additional Information SS#: [id number]  Candie Chroman, LCSW

## 2016-11-02 NOTE — Progress Notes (Signed)
Discharged to [facility], daughter to transport patient. Report given to [facility]. PIV removed no s/s of  Swelling noted. Discharged paper works given to the daughter.

## 2016-11-02 NOTE — Discharge Summary (Signed)
Physician Discharge Summary  Alexandra Luna OHY:[id number] DOB: March 12, 1927 DOA: 11/01/2016  PCP: Reymundo Poll, MD  Admit date: 11/01/2016 Discharge date: 11/02/2016  Time spent: > 35 minutes  Recommendations for Outpatient Follow-up:  1. Monitor serum creatinine 2. Encourage low sodium diet   Discharge Diagnoses:  Active Problems:   CHF exacerbation [facility])   Discharge Condition: Stable  Diet recommendation: Low sodium  Filed Weights   11/01/16 1343 11/02/16 0620  Weight: 86.9 kg (191 lb 8 oz) 85.1 kg (187 lb 9.6 oz)    History of present illness:  83 y.o. female  with past medical history of anxiety, atrial fibrillation, breast cancer, diabetes and hypertension presents with chief complaint of shortness of breath. Patient states that this started acutely last night. Has not improved. Denies sick contacts. Denies fever and cough. No chest pain.  Patient presented with presumed acute diastolic congestive heart failure with  Hospital Course:  Acute diastolic congestive heart failure - Resolved with administration of IV Lasix. On examination patient was breathing comfortably on room air smiling at times with no increased work of breathing. - Discharge back to facility on prior medication regimen. - Echocardiogram reported EF 55-60%  Other known medical conditions we'll continue prior to admission medication regimen.  Procedures:  EF 55-60%  Consultations:  None  Discharge Exam: Vitals:   11/02/16 0620 11/02/16 1216  BP: 136/79 138/80  Pulse: 65 70  Resp: 17 18  Temp: 97.8 F (36.6 C) 97.7 F (36.5 C)    General: Patient in no acute distress, alert and awake Cardiovascular: S1 and S2 present, no murmurs rubs Respiratory: No increased work of breathing, equal chest rise, no wheezes  Discharge Instructions   Discharge Instructions    Call MD for:  difficulty breathing, headache or visual disturbances    Complete by:  As directed    Call MD for:  temperature  >100.4    Complete by:  As directed    Diet - low sodium heart healthy    Complete by:  As directed    Increase activity slowly    Complete by:  As directed      Current Discharge Medication List    CONTINUE these medications which have NOT CHANGED   Details  acetaminophen (TYLENOL) 325 MG tablet Take 650 mg by mouth 2 (two) times daily as needed for mild pain.    albuterol (PROVENTIL HFA;VENTOLIN HFA) 108 (90 Base) MCG/ACT inhaler Inhale 1 puff into the lungs every 4 (four) hours as needed for wheezing or shortness of breath. Qty: 1 Inhaler, Refills: 0    apixaban (ELIQUIS) 2.5 MG TABS tablet Take 1 tablet (2.5 mg total) by mouth 2 (two) times daily. Qty: 60 tablet, Refills: 6    atenolol (TENORMIN) 25 MG tablet Take 1 tablet (25 mg total) by mouth at bedtime. Qty: 30 tablet, Refills: 0    calcium carbonate (OS-CAL) 600 MG TABS Take 600 mg by mouth daily.    Associated Diagnoses: Breast CA ([place])    escitalopram (LEXAPRO) 10 MG tablet Take 10 mg by mouth daily.    fexofenadine (ALLEGRA) 180 MG tablet Take 180 mg by mouth daily.    fluticasone (FLONASE) 50 MCG/ACT nasal spray Place 1 spray into both nostrils at bedtime.     furosemide (LASIX) 20 MG tablet Take 1 tablet (20 mg total) by mouth daily. Qty: 30 tablet, Refills: 0    HYDROcodone-acetaminophen (NORCO/VICODIN) 5-325 MG tablet Take 1 tablet by mouth every 8 (eight) hours as needed  for moderate pain.    Lidocaine 4 % PTCH Apply 1 patch topically 2 (two) times daily as needed (pain).    Menthol, Topical Analgesic, (BIOFREEZE) 4 % GEL Apply 1 application topically 3 (three) times daily.    metoCLOPramide (REGLAN) 10 MG tablet Take 1 tablet (10 mg total) by mouth every 8 (eight) hours as needed for nausea. Qty: 10 tablet, Refills: 0    Multiple Vitamins-Iron (MULTIVITAMINS WITH IRON) TABS tablet Take 1 tablet by mouth daily.    Multiple Vitamins-Minerals (EYE-VITES PO) Take 1 tablet by mouth 2 (two) times daily.     ondansetron (ZOFRAN) 4 MG tablet Take 1 tablet (4 mg total) by mouth every 6 (six) hours as needed for nausea. Qty: 20 tablet, Refills: 0    Psyllium 500 MG CAPS Take 500 mg by mouth daily.    rosuvastatin (CRESTOR) 10 MG tablet Take 1 tablet (10 mg total) by mouth every other day. Qty: 30 tablet, Refills: 5   Associated Diagnoses: Breast CA, unspecified laterality    senna (SENOKOT) 8.6 MG TABS tablet Take 1 tablet by mouth 3 (three) times a week. Monday, Wednesday and Friday    Spacer/Aero-Holding Chambers (AEROCHAMBER PLUS FLO-VU MEDIUM) MISC 1 each by Other route once. Qty: 1 each, Refills: 0    spironolactone (ALDACTONE) 25 MG tablet Take 1 tablet by mouth daily.    traMADol (ULTRAM) 50 MG tablet Take 1 tablet (50 mg total) by mouth every 6 (six) hours as needed. Qty: 20 tablet, Refills: 0    verapamil (VERELAN PM) 240 MG 24 hr capsule Take 1 capsule (240 mg total) by mouth at bedtime. Qty: 90 capsule, Refills: 3       Allergies  Allergen Reactions  . Influenza Vaccines Other (See Comments)    Pt's arm swelled and dr told her not to take vaccine again.  [place] Fleeting [Trandolapril-Verapamil Hcl Er] Other (See Comments)    Increase blood pressure  . Adhesive [Tape] Rash    Adhesive bandaids   . Azithromycin Rash  . Lipitor [Atorvastatin] Rash  . Penicillins Rash      The results of significant diagnostics from this hospitalization (including imaging, microbiology, ancillary and laboratory) are listed below for reference.    Significant Diagnostic Studies: Dg Chest 2 View  Result Date: 11/01/2016 CLINICAL DATA:  Initial evaluation for acute shortness of breath. EXAM: CHEST  2 VIEW COMPARISON:  Prior radiograph from 09/05/2016. FINDINGS: Moderate cardiomegaly. Mediastinal silhouette within normal limits. Aortic atherosclerosis. Lungs normally inflated. Diffuse interstitial prominence, greatest within the lung bases, suggesting mild pulmonary interstitial edema. Probable small  bilateral pleural effusions. No definite focal infiltrates. No pneumothorax. Age indeterminate wedging of the superior endplate of L2. No other acute osseous abnormality. Surgical clips overlie the left axilla. IMPRESSION: 1. Cardiomegaly with mild diffuse pulmonary interstitial edema. 2. Aortic atherosclerosis. 3. Wedging of the superior endplate of L2, age indeterminate. Correlation physical exam recommended. Electronically Signed   By: Jeannine Boga M.D.   On: 11/01/2016 05:31    Microbiology: Recent Results (from the past 240 hour(s))  MRSA PCR Screening     Status: Abnormal   Collection Time: 11/01/16  7:22 PM  Result Value Ref Range Status   MRSA by PCR POSITIVE (A) NEGATIVE Final    Comment:        The GeneXpert MRSA Assay (FDA approved for NASAL specimens only), is one component of a comprehensive MRSA colonization surveillance program. It is not intended to diagnose MRSA infection nor to guide  or monitor treatment for MRSA infections. RESULT CALLED TO, READ BACK BY AND VERIFIED WITH: R.YOCUM,RN AT 0106 BY L.PITT 11/02/16       Labs: Basic Metabolic Panel:  Recent Labs Lab 11/01/16 0540 11/02/16 0426  NA 134* 132*  K 4.4 3.8  CL 101 97*  CO2 27 27  GLUCOSE 118* 175*  BUN 23* 24*  CREATININE 0.85 0.91  CALCIUM 9.7 9.8   Liver Function Tests:  Recent Labs Lab 11/01/16 0921  AST 30  ALT 15  ALKPHOS 80  BILITOT 0.7  PROT 6.3*  ALBUMIN 3.5   No results for input(s): LIPASE, AMYLASE in the last 168 hours. No results for input(s): AMMONIA in the last 168 hours. CBC:  Recent Labs Lab 11/01/16 0540  WBC 9.2  NEUTROABS 7.0  HGB 10.9*  HCT 33.9*  MCV 93.6  PLT 223   Cardiac Enzymes:  Recent Labs Lab 11/01/16 1616 11/01/16 2322  TROPONINI <0.03 <0.03   BNP: BNP (last 3 results)  Recent Labs  09/05/16 1309 11/01/16 0540  BNP 181.6* 500.0*    ProBNP (last 3 results) No results for input(s): PROBNP in the last 8760 hours.  CBG: No  results for input(s): GLUCAP in the last 168 hours.   Signed:  Velvet Bathe MD.  [facility] 11/02/2016, 3:40 PM

## 2016-11-02 NOTE — Clinical Social Work Note (Signed)
CSW facilitated patient discharge including contacting patient family and facility to confirm patient discharge plans. Clinical information faxed to facility and family agreeable with plan. Patient's daughter, [name], will transport patient by car. RN to call report prior to discharge [phone]).  CSW will sign off for now as social work intervention is no longer needed. Please consult [place] again if new needs arise.  Dayton Scrape, [place]

## 2016-11-02 NOTE — Care Management Obs Status (Signed)
[place] NOTIFICATION   Patient Details  Name: Alexandra Luna MRN: [id number] Date of Birth: [birth year]   Medicare Observation Status Notification Given:  Yes    Royston Bake, RN 11/02/2016, 3:42 PM

## 2016-11-05 NOTE — Progress Notes (Signed)
PT eval addendum- added G-codes    2016-11-07 1203  PT G-Codes **NOT FOR INPATIENT CLASS**  Functional Assessment Tool Used Clinical judgement  Functional Limitation Mobility: Walking and moving around  Mobility: Walking and Moving Around Current Status [phone]) CI  Mobility: Walking and Moving Around Goal Status [phone]) CI    Wray Kearns, PT, DPT [phone]

## 2016-11-28 IMAGING — CT CT ANGIO CHEST
2 of 6 series · 17 of 46 positions shown · IV contrast (omnipaque)
Comparison: Current chest radiograph.

CLINICAL DATA: Reports episode of SOB earlier and history of
anxiety.Nurse note: Ems called out to Zuure Momee for pt being
sob, That has resolved on it's on, Pt says she Daniel Estuardo Karlita at
facility and wants to know if she is receiving the correct
medication. Pt is alert and oriented per norm, history of anxiety

EXAM:
CT ANGIOGRAPHY CHEST WITH CONTRAST
TECHNIQUE: Multidetector CT imaging of the chest was performed using the
standard protocol during bolus administration of intravenous
contrast. Multiplanar CT image reconstructions and MIPs were
obtained to evaluate the vascular anatomy.
CONTRAST:  100mL OMNIPAQUE IOHEXOL 350 MG/ML SOLN

[Series 6: thins for pacs · axial · 0.74mm/px · z∈[-280,-29]mm · 14 of 275 slices shown]
[im 12/275  lung]
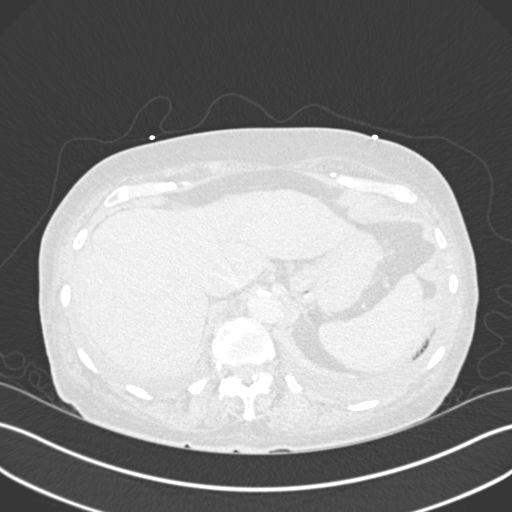
[im 36/275  soft-tissue]
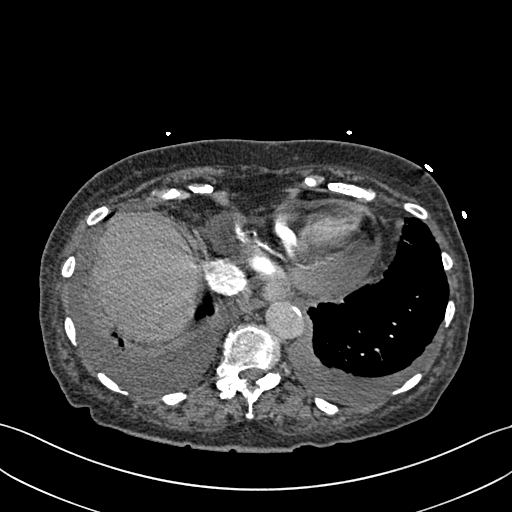
[im 48/275  lung]
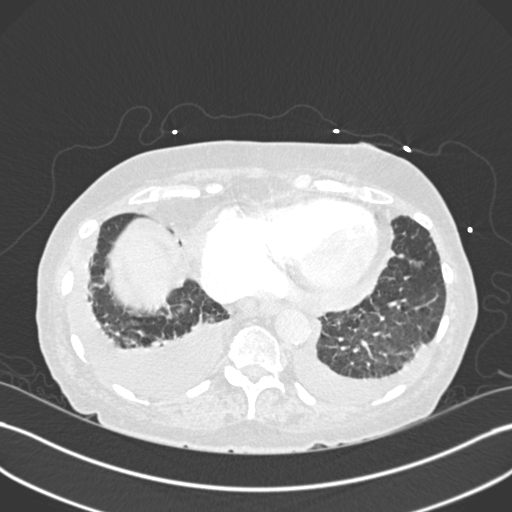
[im 72/275  soft-tissue]
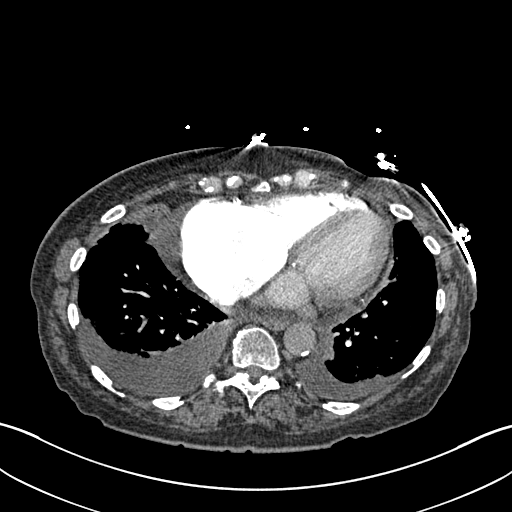
[im 96/275  lung]
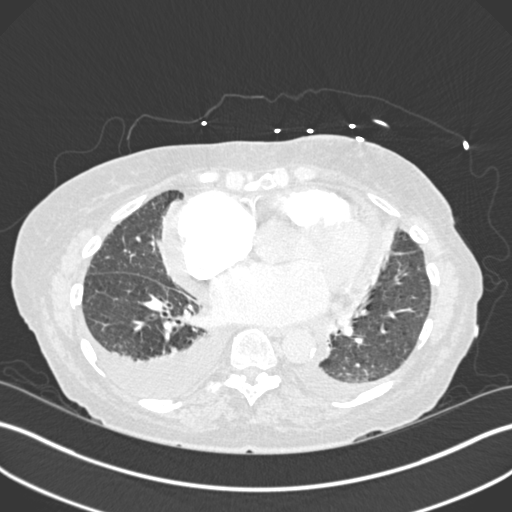
[im 108/275  soft-tissue]
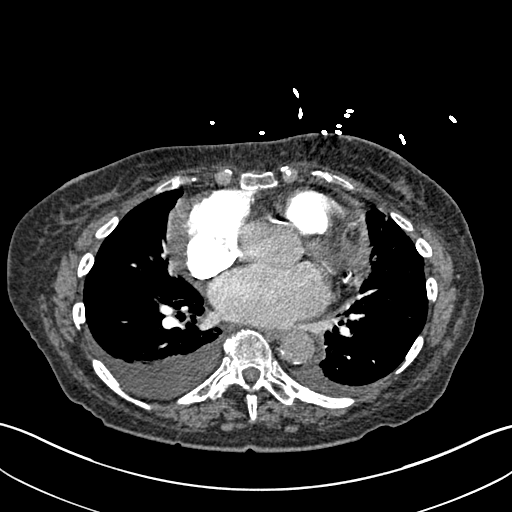
[im 132/275  lung]
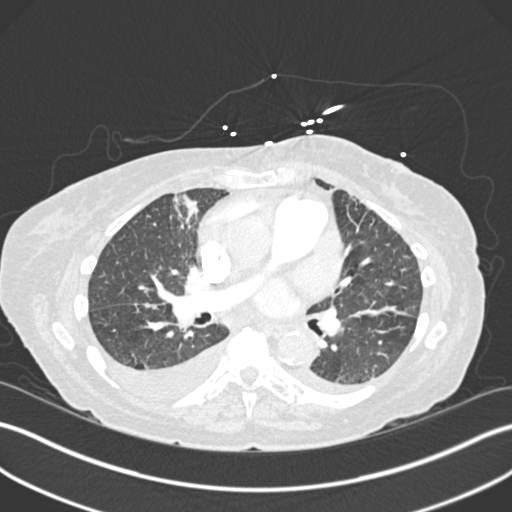
[im 143/275  soft-tissue]
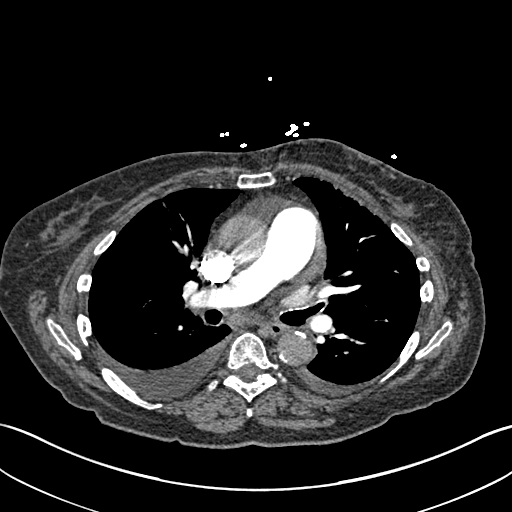
[im 167/275  lung]
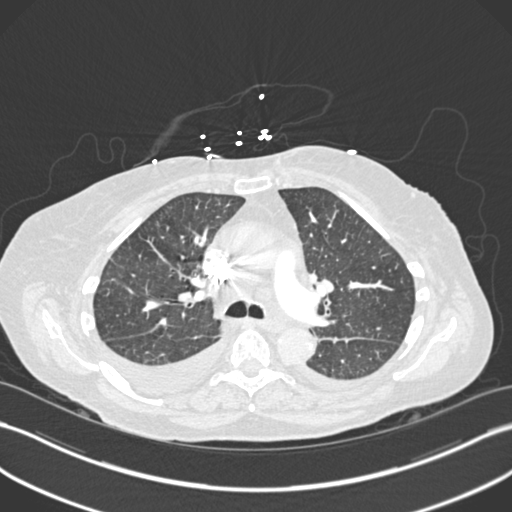
[im 179/275  soft-tissue]
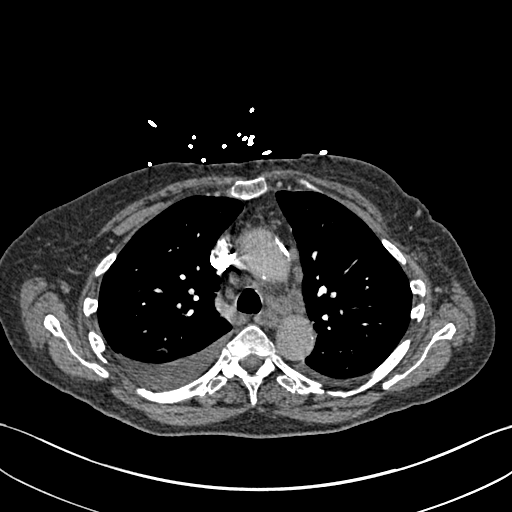
[im 203/275  lung]
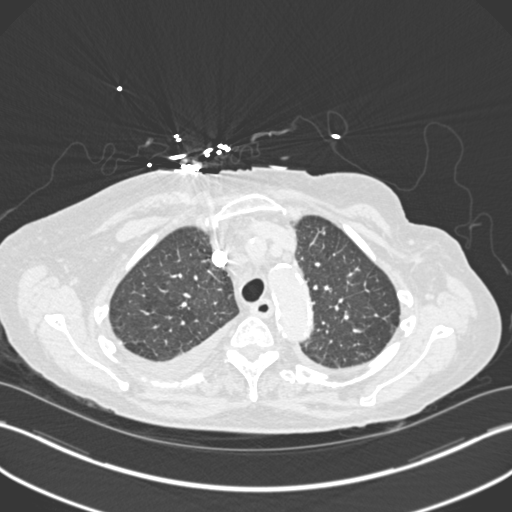
[im 227/275  soft-tissue]
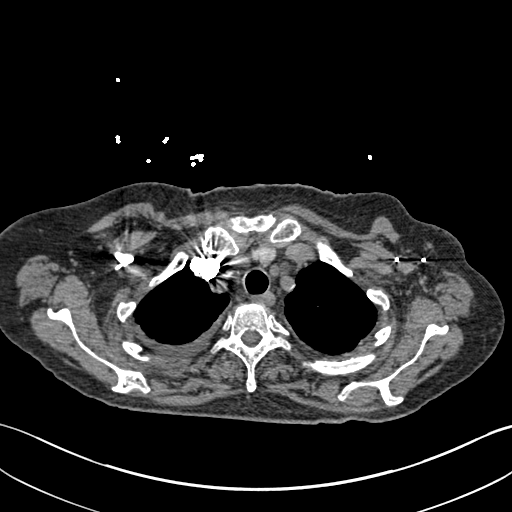
[im 239/275  lung]
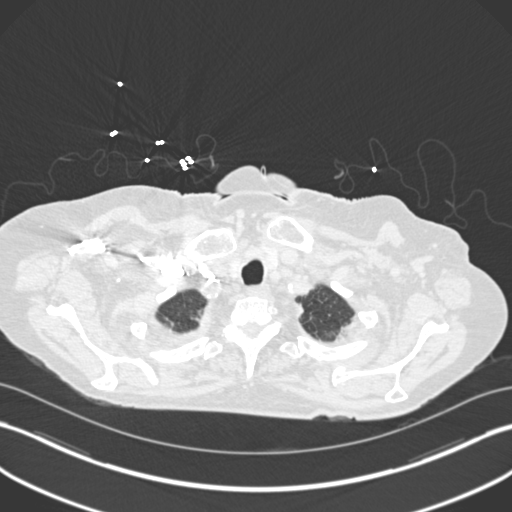
[im 263/275  soft-tissue]
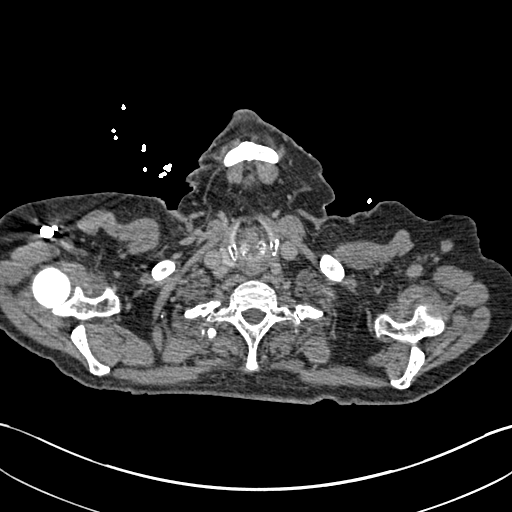

[Series 8: coronal mpr · coronal · 0.54mm/px · 3 of 113 slices shown]
[im 29/113  soft-tissue]
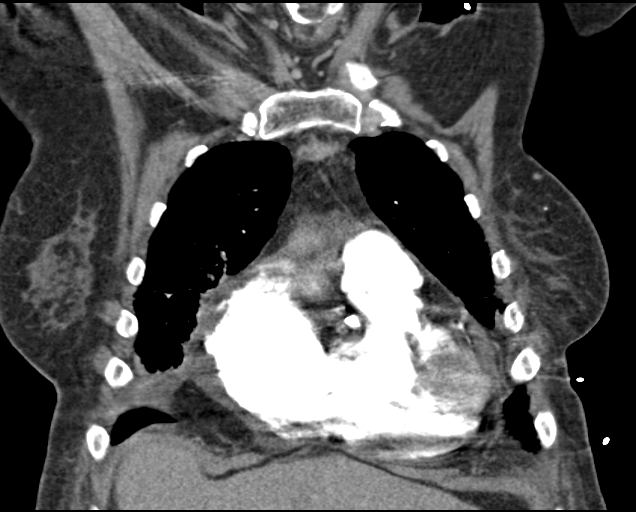
[im 57/113  soft-tissue]
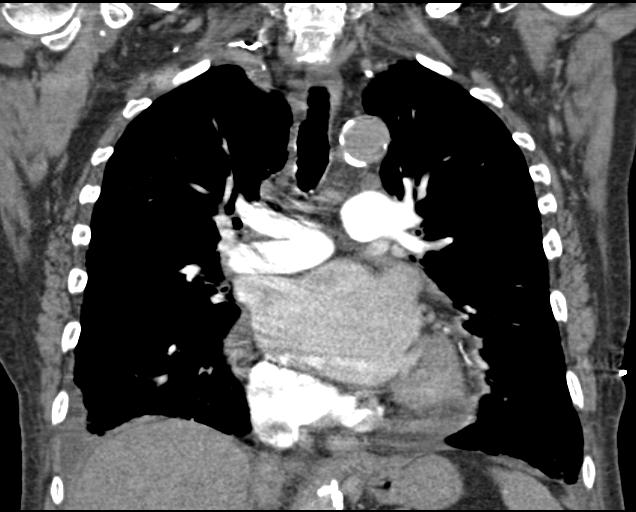
[im 85/113  soft-tissue]
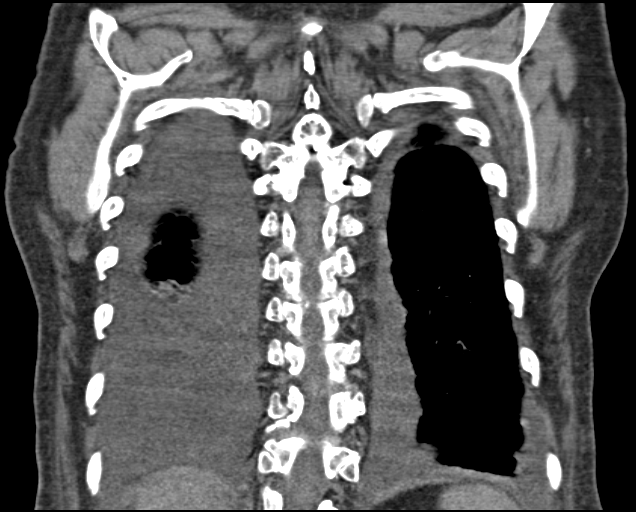

[17 of 46 positions shown; findings below may reference images not displayed]

FINDINGS: Angiographic study: No evidence of a pulmonary embolus. Great
vessels normal in caliber. Atherosclerotic calcifications seen
across the thoracic aorta.

Thoracic inlet:  No neck base or axillary masses or adenopathy

Mediastinum and hila: Mild cardiomegaly. Moderate coronary artery
calcifications. Small pericardial effusion. Mild mediastinal
adenopathy. Reference noted in the AP window measures 13 mm in short
axis. Right para carinal node adjacent to the azygos arch measures
14 mm in short axis. No mediastinal masses. No hilar masses or
discrete enlarged lymph nodes.

Lungs and pleural: Moderate right and small left pleural effusions.
There is bilateral diffuse interstitial thickening. More focal
linear and discoid type opacity is noted in the anteromedial upper
lobes and right middle lobe, likely atelectasis. There are nodular
opacities in the right middle lobe. Largest measures 5 mm.
Additional reticular opacities are noted in the lung bases likely
subsegmental atelectasis. No pneumothorax.

Limited upper abdomen: Liver shows a nodular contour suggesting
cirrhosis.

There is mild diffuse subcutaneous edema.

Musculoskeletal: Mild degenerative changes of the thoracic spine. No
osteoblastic or osteolytic lesions.

Review of the MIP images confirms the above findings.
IMPRESSION: 1. No evidence of a pulmonary embolism.
2. Findings consistent with congestive heart failure with
cardiomegaly, interstitial thickening and moderate right and small
left pleural effusions. There is also a small pericardial effusion.
No convincing pneumonia.
3. Mild mediastinal adenopathy, likely reactive.
4. Small nodular densities in the lung bases. Largest measures 5 mm.
If the patient is at high risk for bronchogenic carcinoma, follow-up
chest CT at 6-12 months is recommended. If the patient is at low
risk for bronchogenic carcinoma, follow-up chest CT at 12 months is
recommended. This recommendation follows the consensus statement:
Guidelines for Management of Small Pulmonary Nodules Detected on CT
Scans: A Statement from the [HOSPITAL] as published in

## 2017-05-28 ENCOUNTER — Other Ambulatory Visit: Payer: Self-pay

## 2017-05-28 ENCOUNTER — Emergency Department (HOSPITAL_COMMUNITY)
Admission: EM | Admit: 2017-05-28 | Discharge: 2017-05-28 | Disposition: A | Discharge status: Home / Self Care | Payer: Medicare Other | Attending: Emergency Medicine | Admitting: Emergency Medicine

## 2017-05-28 ENCOUNTER — Emergency Department (HOSPITAL_COMMUNITY): Payer: Medicare Other

## 2017-05-28 DIAGNOSIS — Y999 Unspecified external cause status: Secondary | ICD-10-CM | POA: Diagnosis not present

## 2017-05-28 DIAGNOSIS — Z79899 Other long term (current) drug therapy: Secondary | ICD-10-CM | POA: Insufficient documentation

## 2017-05-28 DIAGNOSIS — Y92003 Bedroom of unspecified non-institutional (private) residence as the place of occurrence of the external cause: Secondary | ICD-10-CM | POA: Insufficient documentation

## 2017-05-28 DIAGNOSIS — S0990XA Unspecified injury of head, initial encounter: Secondary | ICD-10-CM | POA: Diagnosis not present

## 2017-05-28 DIAGNOSIS — L989 Disorder of the skin and subcutaneous tissue, unspecified: Secondary | ICD-10-CM | POA: Diagnosis not present

## 2017-05-28 DIAGNOSIS — J449 Chronic obstructive pulmonary disease, unspecified: Secondary | ICD-10-CM | POA: Diagnosis not present

## 2017-05-28 DIAGNOSIS — I110 Hypertensive heart disease with heart failure: Secondary | ICD-10-CM | POA: Diagnosis not present

## 2017-05-28 DIAGNOSIS — Z853 Personal history of malignant neoplasm of breast: Secondary | ICD-10-CM | POA: Diagnosis not present

## 2017-05-28 DIAGNOSIS — I5032 Chronic diastolic (congestive) heart failure: Secondary | ICD-10-CM | POA: Diagnosis not present

## 2017-05-28 DIAGNOSIS — Z87891 Personal history of nicotine dependence: Secondary | ICD-10-CM | POA: Insufficient documentation

## 2017-05-28 DIAGNOSIS — W19XXXA Unspecified fall, initial encounter: Secondary | ICD-10-CM | POA: Insufficient documentation

## 2017-05-28 DIAGNOSIS — Y9389 Activity, other specified: Secondary | ICD-10-CM | POA: Insufficient documentation

## 2017-05-28 DIAGNOSIS — Z7901 Long term (current) use of anticoagulants: Secondary | ICD-10-CM | POA: Insufficient documentation

## 2017-05-28 DIAGNOSIS — R600 Localized edema: Secondary | ICD-10-CM | POA: Diagnosis present

## 2017-05-28 NOTE — ED Provider Notes (Addendum)
[facility] EMERGENCY DEPARTMENT Provider Note   CSN: [id number] Arrival date & time: 05/28/17  0036     History   Chief Complaint Chief Complaint  Patient presents with  . Fall    HPI Alexandra Luna is a 84 y.o. female.  Patient presents to the emergency department for evaluation after a fall.  Patient had an unwitnessed fall prior to arrival.  Patient does not remember the fall, thinks that she got up out of bed to search for something and then fell.  Staff thought she might have rolled out of bed.  She was found lying next to her bed.  There is dried blood on her head, source unclear.  At arrival she is without complaints.  She denies any pain including upper extremities, lower extremities including hips, neck, back.      Past Medical History:  Diagnosis Date  . Anxiety   . Atrial fibrillation (HCC)    On chronic Eliquis  . Breast cancer ([place]) dx'd 1997   left; lt lumpectomy and XRT  . Mixed hyperlipidemia   . Palpitations   . Pre-diabetes   . Renal hypertension 03/07/2005   no evidence of significant diameter reduction, dissection, tortuosity, FMD or orther vascular abnormality; kidneys equal in size, symmetry, with normal cortex and medulla    Patient Active Problem List   Diagnosis Date Noted  . CHF exacerbation ([place]) 11/01/2016  . Weakness 09/06/2016  . Chronic diastolic CHF (congestive heart failure) ([place]) 09/06/2016  . ARF (acute renal failure) ([place]) 09/06/2016  . Weakness generalized 09/06/2016  . COPD exacerbation ([place]) 04/01/2015  . Bilateral pleural effusion 08/30/2014  . Multiple pulmonary nodules 08/30/2014  . Acute diastolic congestive heart failure ([place]) 08/14/2014  . Acute respiratory failure with hypoxia ([place]) 08/14/2014  . Generalized weakness 06/28/2014  . Chronic anticoagulation 12/08/2012  . AF (atrial fibrillation) ([place]) 09/22/2012  . Essential hypertension 09/22/2012  . Hyperlipidemia, mixed 09/22/2012  . History of  breast cancer in female 04/28/2012    Past Surgical History:  Procedure Laterality Date  . BREAST LUMPECTOMY    . BREAST LUMPECTOMY  1998   left side  . CARDIOVASCULAR STRESS TEST  526/2011   R/Dipyridamole - EF 71%; normal myocaridal perfusion w/o evidence of ischemia or infarct; no schintigraphic evidence of inducible myocaridal ischemia; global LV function normal; EKG negative for ischemia; low risk scan   . CARDIOVERSION N/A 11/12/2012   Procedure: CARDIOVERSION;  Surgeon: Troy Sine, MD;  Location: [facility];  Service: Cardiovascular;  Laterality: N/A;  . CESAREAN SECTION     x 3  . DOPPLER ECHOCARDIOGRAPHY  08/29/2011   EF >84%;  LV systolic fcn normal; doppler flow pattern suggestive of impaired LV relaxation; LA mildly dilated; aortic valve mildly sclerotic, no aortic stenosis  . tonsillectomy      OB History    No data available       Home Medications    Prior to Admission medications   Medication Sig Start Date End Date Taking? Authorizing Provider  acetaminophen (TYLENOL) 325 MG tablet Take 650 mg by mouth 2 (two) times daily as needed for mild pain.    [provider]  albuterol (PROVENTIL HFA;VENTOLIN HFA) 108 (90 Base) MCG/ACT inhaler Inhale 1 puff into the lungs every 4 (four) hours as needed for wheezing or shortness of breath. 04/03/15   Nita Sells, MD  apixaban (ELIQUIS) 2.5 MG TABS tablet Take 1 tablet (2.5 mg total) by mouth 2 (two) times daily. 12/23/13  Troy Sine, MD  atenolol (TENORMIN) 25 MG tablet Take 1 tablet (25 mg total) by mouth at bedtime. 07/01/14   Robbie Lis, MD  calcium carbonate (OS-CAL) 600 MG TABS Take 600 mg by mouth daily.     [provider]  escitalopram (LEXAPRO) 10 MG tablet Take 10 mg by mouth daily.    [provider]  fexofenadine (ALLEGRA) 180 MG tablet Take 180 mg by mouth daily.    [provider]  fluticasone (FLONASE) 50 MCG/ACT nasal spray Place 1 spray into both nostrils  at bedtime.  07/04/12   [provider]  furosemide (LASIX) 20 MG tablet Take 1 tablet (20 mg total) by mouth daily. 08/15/14   Robbie Lis, MD  HYDROcodone-acetaminophen (NORCO/VICODIN) 5-325 MG tablet Take 1 tablet by mouth every 8 (eight) hours as needed for moderate pain.    [provider]  Lidocaine 4 % PTCH Apply 1 patch topically 2 (two) times daily as needed (pain).    [provider]  Menthol, Topical Analgesic, (BIOFREEZE) 4 % GEL Apply 1 application topically 3 (three) times daily.    [provider]  metoCLOPramide (REGLAN) 10 MG tablet Take 1 tablet (10 mg total) by mouth every 8 (eight) hours as needed for nausea. 03/21/15   Harvel Quale, MD  Multiple Vitamins-Iron (MULTIVITAMINS WITH IRON) TABS tablet Take 1 tablet by mouth daily.    [provider]  Multiple Vitamins-Minerals (EYE-VITES PO) Take 1 tablet by mouth 2 (two) times daily.    [provider]  ondansetron (ZOFRAN) 4 MG tablet Take 1 tablet (4 mg total) by mouth every 6 (six) hours as needed for nausea. 08/15/14   Robbie Lis, MD  Psyllium 500 MG CAPS Take 500 mg by mouth daily.    [provider]  rosuvastatin (CRESTOR) 10 MG tablet Take 1 tablet (10 mg total) by mouth every other day. 05/10/14   Troy Sine, MD  senna (SENOKOT) 8.6 MG TABS tablet Take 1 tablet by mouth 3 (three) times a week. Monday, Wednesday and Friday    [provider]  Spacer/Aero-Holding Chambers (AEROCHAMBER PLUS FLO-VU MEDIUM) MISC 1 each by Other route once. 04/03/15   Nita Sells, MD  spironolactone (ALDACTONE) 25 MG tablet Take 1 tablet by mouth daily. 02/17/15   [provider]  traMADol (ULTRAM) 50 MG tablet Take 1 tablet (50 mg total) by mouth every 6 (six) hours as needed. Patient taking differently: Take 50 mg by mouth every 6 (six) hours as needed for moderate pain.  08/25/16   Milton Ferguson, MD  verapamil (VERELAN PM) 240 MG 24 hr capsule Take  1 capsule (240 mg total) by mouth at bedtime. 05/03/14   Troy Sine, MD    Family History Family History  Problem Relation Age of Onset  . Heart failure Mother   . Breast cancer Mother   . Hypertension Father   . Kidney cancer Father   . Stroke Maternal Grandmother     Social History Social History   Tobacco Use  . Smoking status: Former Smoker    Packs/day: 0.75    Years: 10.00    Pack years: 7.50    Types: Cigarettes    Last attempt to quit: 04/02/1988    Years since quitting: 29.1  . Smokeless tobacco: Never Used  Substance Use Topics  . Alcohol use: Yes    Alcohol/week: 0.0 oz    Comment: 1/4 glass of wine every 2 weeks  .  Drug use: No     Allergies   Influenza vaccines; Tarka [trandolapril-verapamil hcl er]; Adhesive [tape]; Azithromycin; Lipitor [atorvastatin]; and Penicillins   Review of Systems Review of Systems  Skin: Positive for wound.  All other systems reviewed and are negative.    Physical Exam Updated Vital Signs BP 108/84   Pulse 65   Temp 98.3 F (36.8 C) (Oral)   Resp 20   Ht 5\' 6"  (1.676 m)   Wt 82.6 kg (182 lb)   SpO2 98%   BMI 29.38 kg/m   Physical Exam  Constitutional: She is oriented to person, place, and time. She appears well-developed and well-nourished. No distress.  HENT:  Head: Normocephalic and atraumatic.  Right Ear: Hearing normal.  Left Ear: Hearing normal.  Nose: Nose normal.  Mouth/Throat: Oropharynx is clear and moist and mucous membranes are normal.  Eyes: Conjunctivae and EOM are normal. Pupils are equal, round, and reactive to light.  Neck: Normal range of motion. Neck supple.  Cardiovascular: Regular rhythm, S1 normal and S2 normal. Exam reveals no gallop and no friction rub.  No murmur heard. Pulmonary/Chest: Effort normal and breath sounds normal. No respiratory distress. She exhibits no tenderness.  Abdominal: Soft. Normal appearance and bowel sounds are normal. There is no hepatosplenomegaly. There is  no tenderness. There is no rebound, no guarding, no tenderness at McBurney's point and negative Murphy's sign. No hernia.  Musculoskeletal: Normal range of motion.  Neurological: She is alert and oriented to person, place, and time. She has normal strength. No cranial nerve deficit or sensory deficit. Coordination normal. GCS eye subscore is 4. GCS verbal subscore is 5. GCS motor subscore is 6.  Skin: Skin is warm, dry and intact. No rash noted. No cyanosis.  Right forehead 1 cm irregularly shaped slightly raised erythematous lesion  Left forehead 1 cm x 1.5 cm ulcerated lesion  Psychiatric: She has a normal mood and affect. Her speech is normal and behavior is normal. Thought content normal.  Nursing note and vitals reviewed.    ED Treatments / Results  Labs (all labs ordered are listed, but only abnormal results are displayed) Labs Reviewed - No data to display  EKG  EKG Interpretation None       Radiology Ct Head Wo Contrast  Result Date: 05/28/2017 CLINICAL DATA:  84 year old female with C-spine trauma EXAM: CT HEAD WITHOUT CONTRAST CT CERVICAL SPINE WITHOUT CONTRAST TECHNIQUE: Multidetector CT imaging of the head and cervical spine was performed following the standard protocol without intravenous contrast. Multiplanar CT image reconstructions of the cervical spine were also generated. COMPARISON:  Head CT dated 08/25/2016 FINDINGS: CT HEAD FINDINGS Brain: There is mild age-related atrophy and chronic microvascular ischemic changes. Subcentimeter focus of low-attenuation in the right cerebellar hemisphere most consistent with old lacunar infarct. There is no acute intracranial hemorrhage. No mass effect or midline shift. No extra-axial fluid collection. Vascular: No hyperdense vessel or unexpected calcification. Skull: Normal. Negative for fracture or focal lesion. Sinuses/Orbits: No acute finding. Other: None CT CERVICAL SPINE FINDINGS Alignment: There is reversal of normal cervical  lordosis at C4-C7, likely related to underlying degenerative changes. No acute subluxation. There is grade 1 C3-C4, C4-C5, and C7-T1 anterolisthesis. Skull base and vertebrae: No acute fracture. There is incomplete bony fusion of the posterior ring of C1. Soft tissues and spinal canal: No prevertebral fluid or swelling. No visible canal hematoma. Disc levels: Multilevel degenerative changes most prominent at C5-C6 and C6-C7 with endplate irregularity and disc space narrowing and  osteophyte. Multilevel facet degenerative disease. Upper chest: The visualized left lung apex is unremarkable. The right lung apex is not included in the image. Other: Bilateral carotid bulb dense calcified plaques. IMPRESSION: 1. No acute intracranial hemorrhage. Mild age-related atrophy and chronic microvascular ischemic changes. 2. No acute/traumatic cervical spine pathology. Extensive multilevel degenerative changes. Electronically Signed   By: Anner Crete M.D.   On: 05/28/2017 01:39   Ct Cervical Spine Wo Contrast  Result Date: 05/28/2017 CLINICAL DATA:  84 year old female with C-spine trauma EXAM: CT HEAD WITHOUT CONTRAST CT CERVICAL SPINE WITHOUT CONTRAST TECHNIQUE: Multidetector CT imaging of the head and cervical spine was performed following the standard protocol without intravenous contrast. Multiplanar CT image reconstructions of the cervical spine were also generated. COMPARISON:  Head CT dated 08/25/2016 FINDINGS: CT HEAD FINDINGS Brain: There is mild age-related atrophy and chronic microvascular ischemic changes. Subcentimeter focus of low-attenuation in the right cerebellar hemisphere most consistent with old lacunar infarct. There is no acute intracranial hemorrhage. No mass effect or midline shift. No extra-axial fluid collection. Vascular: No hyperdense vessel or unexpected calcification. Skull: Normal. Negative for fracture or focal lesion. Sinuses/Orbits: No acute finding. Other: None CT CERVICAL SPINE FINDINGS  Alignment: There is reversal of normal cervical lordosis at C4-C7, likely related to underlying degenerative changes. No acute subluxation. There is grade 1 C3-C4, C4-C5, and C7-T1 anterolisthesis. Skull base and vertebrae: No acute fracture. There is incomplete bony fusion of the posterior ring of C1. Soft tissues and spinal canal: No prevertebral fluid or swelling. No visible canal hematoma. Disc levels: Multilevel degenerative changes most prominent at C5-C6 and C6-C7 with endplate irregularity and disc space narrowing and osteophyte. Multilevel facet degenerative disease. Upper chest: The visualized left lung apex is unremarkable. The right lung apex is not included in the image. Other: Bilateral carotid bulb dense calcified plaques. IMPRESSION: 1. No acute intracranial hemorrhage. Mild age-related atrophy and chronic microvascular ischemic changes. 2. No acute/traumatic cervical spine pathology. Extensive multilevel degenerative changes. Electronically Signed   By: Anner Crete M.D.   On: 05/28/2017 01:39    Procedures Procedures (including critical care time)  Medications Ordered in ED Medications - No data to display   Initial Impression / Assessment and Plan / ED Course  I have reviewed the triage vital signs and the nursing notes.  Pertinent labs & imaging results that were available during my care of the patient were reviewed by me and considered in my medical decision making (see chart for details).     Patient presents to the emergency department for evaluation after an unwitnessed fall.  There is dried blood in her hair and on her forehead, source is unclear.  Patient has 2 lesions on her forehead that appear chronic.  She reports that they have been there for some time, has not been able to get to the doctor.  I suspect that the is more on the left side of her forehead which is more deeply ulcerated is the source of the bleeding.  She does not have any wounds that require repair.   Because of her advanced age, CT head and cervical spine were performed to rule out injury.  No acute injury noted.  No other injury suspected, patient moves hips bilaterally without any pain, no deformity noted of extremities, no thoracic or lumbar spine pain or tenderness.  Patient does have suspicious skin lesions on her forehead, will need to follow-up with dermatology.  Final Clinical Impressions(s) / ED Diagnoses   Final diagnoses:  Fall, initial encounter  Skin lesion of face    ED Discharge Orders    None       Alexandra Luna, Gwenyth Allegra, MD 05/28/17 [id number]    Orpah Greek, MD 05/28/17 [id number]

## 2017-05-28 NOTE — Discharge Instructions (Addendum)
You need to be scheduled for a follow-up appointment with a dermatologist to have the lesions on forehead checked for skin cancer

## 2017-05-28 NOTE — ED Triage Notes (Signed)
Patient rolled over in bed and hit her head on the nightstand. Denies LOC. Was ambulatory on scene.

## 2017-05-28 NOTE — ED Notes (Signed)
Pt. Awaiting transport in hallway.

## 2017-11-02 ENCOUNTER — Emergency Department (HOSPITAL_COMMUNITY)
Admission: EM | Admit: 2017-11-02 | Discharge: 2017-11-02 | Disposition: A | Discharge status: Home / Self Care | Payer: Medicare Other | Attending: Emergency Medicine | Admitting: Emergency Medicine

## 2017-11-02 ENCOUNTER — Emergency Department (HOSPITAL_COMMUNITY): Payer: Medicare Other

## 2017-11-02 ENCOUNTER — Other Ambulatory Visit: Payer: Self-pay

## 2017-11-02 ENCOUNTER — Encounter (HOSPITAL_COMMUNITY): Payer: Self-pay

## 2017-11-02 DIAGNOSIS — I5032 Chronic diastolic (congestive) heart failure: Secondary | ICD-10-CM | POA: Insufficient documentation

## 2017-11-02 DIAGNOSIS — Z79899 Other long term (current) drug therapy: Secondary | ICD-10-CM | POA: Diagnosis not present

## 2017-11-02 DIAGNOSIS — R404 Transient alteration of awareness: Secondary | ICD-10-CM | POA: Diagnosis not present

## 2017-11-02 DIAGNOSIS — Z853 Personal history of malignant neoplasm of breast: Secondary | ICD-10-CM | POA: Diagnosis not present

## 2017-11-02 DIAGNOSIS — Z7901 Long term (current) use of anticoagulants: Secondary | ICD-10-CM | POA: Diagnosis not present

## 2017-11-02 DIAGNOSIS — I110 Hypertensive heart disease with heart failure: Secondary | ICD-10-CM | POA: Diagnosis not present

## 2017-11-02 DIAGNOSIS — L03116 Cellulitis of left lower limb: Secondary | ICD-10-CM | POA: Diagnosis not present

## 2017-11-02 DIAGNOSIS — R4182 Altered mental status, unspecified: Secondary | ICD-10-CM | POA: Diagnosis present

## 2017-11-02 DIAGNOSIS — Z87891 Personal history of nicotine dependence: Secondary | ICD-10-CM | POA: Diagnosis not present

## 2017-11-02 LAB — URINALYSIS, ROUTINE W REFLEX MICROSCOPIC
Bilirubin Urine: NEGATIVE
Glucose, UA: NEGATIVE mg/dL
Hgb urine dipstick: NEGATIVE
KETONES UR: NEGATIVE mg/dL
LEUKOCYTES UA: NEGATIVE
Nitrite: NEGATIVE
PROTEIN: NEGATIVE mg/dL
Specific Gravity, Urine: 1.014 (ref 1.005–1.030)
pH: 5 (ref 5.0–8.0)

## 2017-11-02 LAB — CBC
HCT: 38.2 % (ref 36.0–46.0)
HEMOGLOBIN: 12 g/dL (ref 12.0–15.0)
MCH: 29.6 pg (ref 26.0–34.0)
MCHC: 31.4 g/dL (ref 30.0–36.0)
MCV: 94.1 fL (ref 78.0–100.0)
Platelets: 216 10*3/uL (ref 150–400)
RBC: 4.06 MIL/uL (ref 3.87–5.11)
RDW: 16 % — ABNORMAL HIGH (ref 11.5–15.5)
WBC: 10.2 10*3/uL (ref 4.0–10.5)

## 2017-11-02 LAB — COMPREHENSIVE METABOLIC PANEL
ALBUMIN: 3.3 g/dL — AB (ref 3.5–5.0)
ALT: 17 U/L (ref 0–44)
AST: 23 U/L (ref 15–41)
Alkaline Phosphatase: 92 U/L (ref 38–126)
Anion gap: 9 (ref 5–15)
BUN: 32 mg/dL — ABNORMAL HIGH (ref 8–23)
CO2: 26 mmol/L (ref 22–32)
Calcium: 9.9 mg/dL (ref 8.9–10.3)
Chloride: 98 mmol/L (ref 98–111)
Creatinine, Ser: 1.19 mg/dL — ABNORMAL HIGH (ref 0.44–1.00)
GFR calc Af Amer: 46 mL/min — ABNORMAL LOW (ref >60)
GFR calc non Af Amer: 40 mL/min — ABNORMAL LOW (ref >60)
GLUCOSE: 102 mg/dL — AB (ref 70–99)
Potassium: 4.4 mmol/L (ref 3.5–5.1)
Sodium: 133 mmol/L — ABNORMAL LOW (ref 135–145)
Total Bilirubin: 1 mg/dL (ref 0.3–1.2)
Total Protein: 6.7 g/dL (ref 6.5–8.1)

## 2017-11-02 LAB — I-STAT CG4 LACTIC ACID, ED: LACTIC ACID, VENOUS: 1.56 mmol/L (ref 0.5–1.9)

## 2017-11-02 LAB — I-STAT TROPONIN, ED: Troponin i, poc: 0 ng/mL (ref 0.00–0.08)

## 2017-11-02 LAB — CBG MONITORING, ED: GLUCOSE-CAPILLARY: 91 mg/dL (ref 70–99)

## 2017-11-02 MED ORDER — SODIUM CHLORIDE 0.9 % IV SOLN
1.0000 g | Freq: Once | INTRAVENOUS | Status: AC
  Administered 2017-11-02: 1 g via INTRAVENOUS
  Filled 2017-11-02: qty 10

## 2017-11-02 MED ORDER — CEPHALEXIN 500 MG PO CAPS: 1000.0000 mg | ORAL_CAPSULE | Freq: Two times a day (BID) | ORAL | 0 refills | Status: DC

## 2017-11-02 MED ORDER — SODIUM CHLORIDE 0.9 % IV BOLUS
500.0000 mL | Freq: Once | INTRAVENOUS | Status: AC
  Administered 2017-11-02: 500 mL via INTRAVENOUS

## 2017-11-02 NOTE — ED Notes (Signed)
PT states understanding of care given, follow up care, and medication prescribed. PT ambulated from ED to car with a steady gait. 

## 2017-11-02 NOTE — ED Notes (Signed)
Pt given turkey sandwich and water

## 2017-11-02 NOTE — ED Provider Notes (Signed)
[facility] EMERGENCY DEPARTMENT Provider Note   CSN: [id number] Arrival date & time: 11/02/17  1259     History   Chief Complaint Chief Complaint  Patient presents with  . Altered Mental Status    HPI Alexandra Luna is a 84 y.o. female.  HPI Patient denies she has any significant amount of symptoms.  She reports that she was resting at her assisted living and when they came to get her for lunch that she did not feel like going.  She reports it seems that they took offense at this and had a lot of people come to her room to check her.  (Patient seems quite well oriented but during this description she did remark that, "one of the girls took a lot of offense to the fact that I said she was not my sister, but she is not and that is true.").  Patient can only recall as far symptoms though feeling somewhat nauseated yesterday evening.  Denies any chest pain, shortness of breath, abdominal pain, pain burning urgency with urination.  She reports she is "84 years old" and does not feel like she is 27 anymore and that's to be expected. Past Medical History:  Diagnosis Date  . Anxiety   . Atrial fibrillation (HCC)    On chronic Eliquis  . Breast cancer ([place]) dx'd 1997   left; lt lumpectomy and XRT  . Mixed hyperlipidemia   . Palpitations   . Pre-diabetes   . Renal hypertension 03/07/2005   no evidence of significant diameter reduction, dissection, tortuosity, FMD or orther vascular abnormality; kidneys equal in size, symmetry, with normal cortex and medulla    Patient Active Problem List   Diagnosis Date Noted  . CHF exacerbation ([place]) 11/01/2016  . Weakness 09/06/2016  . Chronic diastolic CHF (congestive heart failure) ([place]) 09/06/2016  . ARF (acute renal failure) ([place]) 09/06/2016  . Weakness generalized 09/06/2016  . COPD exacerbation ([place]) 04/01/2015  . Bilateral pleural effusion 08/30/2014  . Multiple pulmonary nodules 08/30/2014  . Acute diastolic congestive  heart failure ([place]) 08/14/2014  . Acute respiratory failure with hypoxia ([place]) 08/14/2014  . Generalized weakness 06/28/2014  . Chronic anticoagulation 12/08/2012  . AF (atrial fibrillation) ([place]) 09/22/2012  . Essential hypertension 09/22/2012  . Hyperlipidemia, mixed 09/22/2012  . History of breast cancer in female 04/28/2012    Past Surgical History:  Procedure Laterality Date  . BREAST LUMPECTOMY    . BREAST LUMPECTOMY  1998   left side  . CARDIOVASCULAR STRESS TEST  526/2011   R/Dipyridamole - EF 71%; normal myocaridal perfusion w/o evidence of ischemia or infarct; no schintigraphic evidence of inducible myocaridal ischemia; global LV function normal; EKG negative for ischemia; low risk scan   . CARDIOVERSION N/A 11/12/2012   Procedure: CARDIOVERSION;  Surgeon: Troy Sine, MD;  Location: [facility];  Service: Cardiovascular;  Laterality: N/A;  . CESAREAN SECTION     x 3  . DOPPLER ECHOCARDIOGRAPHY  08/29/2011   EF >31%;  LV systolic fcn normal; doppler flow pattern suggestive of impaired LV relaxation; LA mildly dilated; aortic valve mildly sclerotic, no aortic stenosis  . tonsillectomy       OB History   None      Home Medications    Prior to Admission medications   Medication Sig Start Date End Date Taking? Authorizing Provider  acetaminophen (TYLENOL) 325 MG tablet Take 650 mg by mouth 2 (two) times daily as needed for mild pain.    [provider]  albuterol (PROVENTIL HFA;VENTOLIN HFA) 108 (90 Base) MCG/ACT inhaler Inhale 1 puff into the lungs every 4 (four) hours as needed for wheezing or shortness of breath. 04/03/15   Nita Sells, MD  apixaban (ELIQUIS) 2.5 MG TABS tablet Take 1 tablet (2.5 mg total) by mouth 2 (two) times daily. 12/23/13   Troy Sine, MD  atenolol (TENORMIN) 25 MG tablet Take 1 tablet (25 mg total) by mouth at bedtime. 07/01/14   Robbie Lis, MD  calcium carbonate (OS-CAL) 600 MG TABS Take 600 mg by mouth daily.      [provider]  cephALEXin (KEFLEX) 500 MG capsule Take 2 capsules (1,000 mg total) by mouth 2 (two) times daily. 11/02/17   Charlesetta Shanks, MD  escitalopram (LEXAPRO) 10 MG tablet Take 10 mg by mouth daily.    [provider]  fexofenadine (ALLEGRA) 180 MG tablet Take 180 mg by mouth daily.    [provider]  fluticasone (FLONASE) 50 MCG/ACT nasal spray Place 1 spray into both nostrils at bedtime.  07/04/12   [provider]  furosemide (LASIX) 20 MG tablet Take 1 tablet (20 mg total) by mouth daily. 08/15/14   Robbie Lis, MD  HYDROcodone-acetaminophen (NORCO/VICODIN) 5-325 MG tablet Take 1 tablet by mouth every 8 (eight) hours as needed for moderate pain.    [provider]  Lidocaine 4 % PTCH Apply 1 patch topically 2 (two) times daily as needed (pain).    [provider]  Menthol, Topical Analgesic, (BIOFREEZE) 4 % GEL Apply 1 application topically 3 (three) times daily.    [provider]  metoCLOPramide (REGLAN) 10 MG tablet Take 1 tablet (10 mg total) by mouth every 8 (eight) hours as needed for nausea. 03/21/15   Harvel Quale, MD  Multiple Vitamins-Iron (MULTIVITAMINS WITH IRON) TABS tablet Take 1 tablet by mouth daily.    [provider]  Multiple Vitamins-Minerals (EYE-VITES PO) Take 1 tablet by mouth 2 (two) times daily.    [provider]  ondansetron (ZOFRAN) 4 MG tablet Take 1 tablet (4 mg total) by mouth every 6 (six) hours as needed for nausea. 08/15/14   Robbie Lis, MD  Psyllium 500 MG CAPS Take 500 mg by mouth daily.    [provider]  rosuvastatin (CRESTOR) 10 MG tablet Take 1 tablet (10 mg total) by mouth every other day. 05/10/14   Troy Sine, MD  senna (SENOKOT) 8.6 MG TABS tablet Take 1 tablet by mouth 3 (three) times a week. Monday, Wednesday and Friday    [provider]  Spacer/Aero-Holding Chambers (AEROCHAMBER PLUS FLO-VU MEDIUM) MISC 1 each by Other route once.  04/03/15   Nita Sells, MD  spironolactone (ALDACTONE) 25 MG tablet Take 1 tablet by mouth daily. 02/17/15   [provider]  traMADol (ULTRAM) 50 MG tablet Take 1 tablet (50 mg total) by mouth every 6 (six) hours as needed. Patient taking differently: Take 50 mg by mouth every 6 (six) hours as needed for moderate pain.  08/25/16   Milton Ferguson, MD  verapamil (VERELAN PM) 240 MG 24 hr capsule Take 1 capsule (240 mg total) by mouth at bedtime. 05/03/14   Troy Sine, MD    Family History Family History  Problem Relation Age of Onset  . Heart failure Mother   . Breast cancer Mother   . Hypertension Father   . Kidney cancer Father   . Stroke Maternal Grandmother  Social History Social History   Tobacco Use  . Smoking status: Former Smoker    Packs/day: 0.75    Years: 10.00    Pack years: 7.50    Types: Cigarettes    Last attempt to quit: 04/02/1988    Years since quitting: 29.6  . Smokeless tobacco: Never Used  Substance Use Topics  . Alcohol use: Yes    Alcohol/week: 0.0 oz    Comment: 1/4 glass of wine every 2 weeks  . Drug use: No     Allergies   Influenza vaccines; [place] [trandolapril-verapamil hcl er]; Adhesive [tape]; Azithromycin; Lipitor [atorvastatin]; and Penicillins   Review of Systems Review of Systems 10 Systems reviewed and are negative for acute change except as noted in the HPI.   Physical Exam Updated Vital Signs BP 128/60   Pulse 64   Temp 97.8 F (36.6 C) (Oral)   Resp 17   Ht 5\' 8"  (1.727 m)   Wt 82.6 kg (182 lb)   SpO2 93%   BMI 27.67 kg/m   Physical Exam  Constitutional: She appears well-developed and well-nourished. No distress.  HENT:  Head: Normocephalic and atraumatic.  Patient's mucous membranes are slightly dry.  Posterior oropharynx is widely patent.  He wears dentures.  Eyes: Pupils are equal, round, and reactive to light. EOM are normal.  Neck: Neck supple.  Cardiovascular:  Irregularly irregular.  No  gross rub murmur gallop.  Pulmonary/Chest:  Crackles right lung base.  No respiratory distress.  No rhonchi.  Abdominal: Soft. She exhibits no distension and no mass. There is no tenderness. There is no guarding.  Musculoskeletal:  Patient has thinning of skin of the feet and lower legs.  There is an ulcer on the fourth toe, left foot.  Left foot appears diffusely slightly erythematous as compared to the right.  (Patient does not seem aware of any problems with the left foot).  See attached images.  Patient has strong b/l pedal pulses with hand held dopler.  Pulses also confirmed at the toes.  Neurological: She is alert. No cranial nerve deficit. She exhibits normal muscle tone. Coordination normal.  Patient is oriented to person, she identifies hospital correctly, she identifies date as August 1.  Skin: Skin is warm and dry.  Psychiatric: She has a normal mood and affect.         ED Treatments / Results  Labs (all labs ordered are listed, but only abnormal results are displayed) Labs Reviewed  COMPREHENSIVE METABOLIC PANEL - Abnormal; Notable for the following components:      Result Value   Sodium 133 (*)    Glucose, Bld 102 (*)    BUN 32 (*)    Creatinine, Ser 1.19 (*)    Albumin 3.3 (*)    GFR calc non Af Amer 40 (*)    GFR calc Af Amer 46 (*)    All other components within normal limits  CBC - Abnormal; Notable for the following components:   RDW 16.0 (*)    All other components within normal limits  URINE CULTURE  URINALYSIS, ROUTINE W REFLEX MICROSCOPIC  CBG MONITORING, ED  I-STAT TROPONIN, ED  I-STAT CG4 LACTIC ACID, ED    EKG None  Radiology No results found.  Procedures Procedures (including critical care time)  Medications Ordered in ED Medications  cefTRIAXone (ROCEPHIN) 1 g in sodium chloride 0.9 % 100 mL IVPB ( Intravenous Stopped 11/02/17 1722)  sodium chloride 0.9 % bolus 500 mL (0 mLs Intravenous Stopped 11/02/17  1751)     Initial Impression /  Assessment and Plan / ED Course  I have reviewed the triage vital signs and the nursing notes.  Pertinent labs & imaging results that were available during my care of the patient were reviewed by me and considered in my medical decision making (see chart for details).     Patient daughter has been present and feels that the patient is at baseline.  Have been reviewed care plan with her.  Final Clinical Impressions(s) / ED Diagnoses   Final diagnoses:  Transient alteration of awareness  Cellulitis of left lower extremity   Patient is brought to the emergency department with vague symptoms of mental status change.  Her evaluation in the emergency department does not really suggest mental status change.  Diagnostic evaluation shows stable basic lab evaluation.  As imaged above, patient does have a callused corn of the fourth toe that appears to have more erythema to the left foot as opposed to the right.  The imaged lesion on the fourth toe is thick and nonfluctuant.  This is not a pustule.  This could represent early cellulitis with very subtle mental status change.  Vital signs of remained stable throughout observation.  Will start the patient on Keflex and daughter is aware of need for close monitoring of the appearance of the foot and any other changes that would suggest advancing illness. ED Discharge Orders        Ordered    cephALEXin (KEFLEX) 500 MG capsule  2 times daily     11/02/17 1751       Charlesetta Shanks, MD 11/04/17 1442

## 2017-11-02 NOTE — ED Notes (Signed)
Patient transported to X-ray 

## 2017-11-02 NOTE — ED Notes (Signed)
Pt given more apple sauce

## 2017-11-02 NOTE — ED Triage Notes (Signed)
Pt from [facility] with ems for AMS. Per staff pt has been less active today and has taken longer to answer questions. No complaints from pt. Denies pain, alert and oriented x3. Denies generalized weakness or fatigue. States "I just didn't want to go to lunch today and they send me here" NAD, VSS

## 2017-11-02 NOTE — ED Notes (Signed)
ED Provider at bedside. 

## 2017-11-02 NOTE — ED Notes (Signed)
Pt changed into blue scrubs due to clothes pt came in are urine soaked

## 2017-11-02 NOTE — Discharge Instructions (Signed)
1 

## 2017-11-03 LAB — URINE CULTURE: CULTURE: NO GROWTH

## 2017-11-08 ENCOUNTER — Encounter (HOSPITAL_COMMUNITY): Payer: Self-pay | Admitting: *Deleted

## 2017-11-08 ENCOUNTER — Inpatient Hospital Stay (HOSPITAL_COMMUNITY)
Admission: EM | Admit: 2017-11-08 | Discharge: 2017-11-15 | DRG: 071 | Disposition: A | Discharge status: Skilled Nursing Facility | Payer: Medicare Other | Attending: Internal Medicine | Admitting: Internal Medicine

## 2017-11-08 ENCOUNTER — Other Ambulatory Visit: Payer: Self-pay

## 2017-11-08 ENCOUNTER — Emergency Department (HOSPITAL_COMMUNITY): Payer: Medicare Other

## 2017-11-08 DIAGNOSIS — E860 Dehydration: Secondary | ICD-10-CM | POA: Diagnosis present

## 2017-11-08 DIAGNOSIS — R109 Unspecified abdominal pain: Secondary | ICD-10-CM

## 2017-11-08 DIAGNOSIS — F0390 Unspecified dementia without behavioral disturbance: Secondary | ICD-10-CM

## 2017-11-08 DIAGNOSIS — Z515 Encounter for palliative care: Secondary | ICD-10-CM

## 2017-11-08 DIAGNOSIS — G9341 Metabolic encephalopathy: Principal | ICD-10-CM | POA: Diagnosis present

## 2017-11-08 DIAGNOSIS — K649 Unspecified hemorrhoids: Secondary | ICD-10-CM | POA: Diagnosis present

## 2017-11-08 DIAGNOSIS — R509 Fever, unspecified: Secondary | ICD-10-CM | POA: Diagnosis present

## 2017-11-08 DIAGNOSIS — K922 Gastrointestinal hemorrhage, unspecified: Secondary | ICD-10-CM

## 2017-11-08 DIAGNOSIS — R7881 Bacteremia: Secondary | ICD-10-CM

## 2017-11-08 DIAGNOSIS — Z880 Allergy status to penicillin: Secondary | ICD-10-CM

## 2017-11-08 DIAGNOSIS — B952 Enterococcus as the cause of diseases classified elsewhere: Secondary | ICD-10-CM | POA: Diagnosis present

## 2017-11-08 DIAGNOSIS — F419 Anxiety disorder, unspecified: Secondary | ICD-10-CM | POA: Diagnosis present

## 2017-11-08 DIAGNOSIS — Z887 Allergy status to serum and vaccine status: Secondary | ICD-10-CM

## 2017-11-08 DIAGNOSIS — D72829 Elevated white blood cell count, unspecified: Secondary | ICD-10-CM | POA: Diagnosis present

## 2017-11-08 DIAGNOSIS — Z993 Dependence on wheelchair: Secondary | ICD-10-CM

## 2017-11-08 DIAGNOSIS — Z95828 Presence of other vascular implants and grafts: Secondary | ICD-10-CM

## 2017-11-08 DIAGNOSIS — M25552 Pain in left hip: Secondary | ICD-10-CM | POA: Diagnosis present

## 2017-11-08 DIAGNOSIS — R531 Weakness: Secondary | ICD-10-CM

## 2017-11-08 DIAGNOSIS — I4891 Unspecified atrial fibrillation: Secondary | ICD-10-CM | POA: Diagnosis present

## 2017-11-08 DIAGNOSIS — E1122 Type 2 diabetes mellitus with diabetic chronic kidney disease: Secondary | ICD-10-CM | POA: Diagnosis present

## 2017-11-08 DIAGNOSIS — I129 Hypertensive chronic kidney disease with stage 1 through stage 4 chronic kidney disease, or unspecified chronic kidney disease: Secondary | ICD-10-CM | POA: Diagnosis present

## 2017-11-08 DIAGNOSIS — Z79899 Other long term (current) drug therapy: Secondary | ICD-10-CM

## 2017-11-08 DIAGNOSIS — K921 Melena: Secondary | ICD-10-CM

## 2017-11-08 DIAGNOSIS — N189 Chronic kidney disease, unspecified: Secondary | ICD-10-CM | POA: Clinically undetermined

## 2017-11-08 DIAGNOSIS — I10 Essential (primary) hypertension: Secondary | ICD-10-CM | POA: Diagnosis present

## 2017-11-08 DIAGNOSIS — R0902 Hypoxemia: Secondary | ICD-10-CM

## 2017-11-08 DIAGNOSIS — R296 Repeated falls: Secondary | ICD-10-CM | POA: Diagnosis present

## 2017-11-08 DIAGNOSIS — Z803 Family history of malignant neoplasm of breast: Secondary | ICD-10-CM

## 2017-11-08 DIAGNOSIS — X58XXXA Exposure to other specified factors, initial encounter: Secondary | ICD-10-CM | POA: Diagnosis present

## 2017-11-08 DIAGNOSIS — N179 Acute kidney failure, unspecified: Secondary | ICD-10-CM | POA: Diagnosis present

## 2017-11-08 DIAGNOSIS — E782 Mixed hyperlipidemia: Secondary | ICD-10-CM | POA: Diagnosis present

## 2017-11-08 DIAGNOSIS — Z7901 Long term (current) use of anticoagulants: Secondary | ICD-10-CM

## 2017-11-08 DIAGNOSIS — Z66 Do not resuscitate: Secondary | ICD-10-CM | POA: Diagnosis present

## 2017-11-08 DIAGNOSIS — Z91048 Other nonmedicinal substance allergy status: Secondary | ICD-10-CM

## 2017-11-08 DIAGNOSIS — B9689 Other specified bacterial agents as the cause of diseases classified elsewhere: Secondary | ICD-10-CM | POA: Diagnosis present

## 2017-11-08 DIAGNOSIS — G9340 Encephalopathy, unspecified: Secondary | ICD-10-CM | POA: Diagnosis not present

## 2017-11-08 DIAGNOSIS — R52 Pain, unspecified: Secondary | ICD-10-CM

## 2017-11-08 DIAGNOSIS — Z853 Personal history of malignant neoplasm of breast: Secondary | ICD-10-CM

## 2017-11-08 DIAGNOSIS — S32592A Other specified fracture of left pubis, initial encounter for closed fracture: Secondary | ICD-10-CM | POA: Diagnosis present

## 2017-11-08 DIAGNOSIS — Z87891 Personal history of nicotine dependence: Secondary | ICD-10-CM

## 2017-11-08 DIAGNOSIS — R7989 Other specified abnormal findings of blood chemistry: Secondary | ICD-10-CM | POA: Diagnosis present

## 2017-11-08 LAB — CBC WITH DIFFERENTIAL/PLATELET
Abs Immature Granulocytes: 0.1 10*3/uL (ref 0.0–0.1)
BASOS ABS: 0.1 10*3/uL (ref 0.0–0.1)
BASOS PCT: 1 %
EOS PCT: 0 %
Eosinophils Absolute: 0 10*3/uL (ref 0.0–0.7)
HCT: 39.7 % (ref 36.0–46.0)
Hemoglobin: 12.7 g/dL (ref 12.0–15.0)
Immature Granulocytes: 1 %
Lymphocytes Relative: 5 %
Lymphs Abs: 0.7 10*3/uL (ref 0.7–4.0)
MCH: 29.8 pg (ref 26.0–34.0)
MCHC: 32 g/dL (ref 30.0–36.0)
MCV: 93.2 fL (ref 78.0–100.0)
MONO ABS: 1 10*3/uL (ref 0.1–1.0)
MONOS PCT: 7 %
Neutro Abs: 12.8 10*3/uL — ABNORMAL HIGH (ref 1.7–7.7)
Neutrophils Relative %: 86 %
PLATELETS: 221 10*3/uL (ref 150–400)
RBC: 4.26 MIL/uL (ref 3.87–5.11)
RDW: 16.2 % — ABNORMAL HIGH (ref 11.5–15.5)
WBC: 14.7 10*3/uL — ABNORMAL HIGH (ref 4.0–10.5)

## 2017-11-08 LAB — URINALYSIS, ROUTINE W REFLEX MICROSCOPIC
Bacteria, UA: NONE SEEN
Bilirubin Urine: NEGATIVE
GLUCOSE, UA: NEGATIVE mg/dL
Hgb urine dipstick: NEGATIVE
KETONES UR: 5 mg/dL — AB
Leukocytes, UA: NEGATIVE
Nitrite: NEGATIVE
PH: 5 (ref 5.0–8.0)
PROTEIN: 30 mg/dL — AB
Specific Gravity, Urine: 1.026 (ref 1.005–1.030)

## 2017-11-08 LAB — COMPREHENSIVE METABOLIC PANEL
ALBUMIN: 3.3 g/dL — AB (ref 3.5–5.0)
ALT: 24 U/L (ref 0–44)
ANION GAP: 14 (ref 5–15)
AST: 32 U/L (ref 15–41)
Alkaline Phosphatase: 89 U/L (ref 38–126)
BUN: 26 mg/dL — AB (ref 8–23)
CO2: 22 mmol/L (ref 22–32)
CREATININE: 1.01 mg/dL — AB (ref 0.44–1.00)
Calcium: 10.1 mg/dL (ref 8.9–10.3)
Chloride: 97 mmol/L — ABNORMAL LOW (ref 98–111)
GFR calc Af Amer: 56 mL/min — ABNORMAL LOW (ref >60)
GFR calc non Af Amer: 48 mL/min — ABNORMAL LOW (ref >60)
GLUCOSE: 115 mg/dL — AB (ref 70–99)
Potassium: 4.3 mmol/L (ref 3.5–5.1)
Sodium: 133 mmol/L — ABNORMAL LOW (ref 135–145)
TOTAL PROTEIN: 6.7 g/dL (ref 6.5–8.1)
Total Bilirubin: 1.6 mg/dL — ABNORMAL HIGH (ref 0.3–1.2)

## 2017-11-08 LAB — TROPONIN I
TROPONIN I: 0.04 ng/mL — AB (ref <0.03)
Troponin I: 0.04 ng/mL (ref <0.03)
Troponin I: 0.05 ng/mL (ref <0.03)

## 2017-11-08 LAB — POC OCCULT BLOOD, ED: Fecal Occult Bld: POSITIVE — AB

## 2017-11-08 LAB — BRAIN NATRIURETIC PEPTIDE: B NATRIURETIC PEPTIDE 5: 1970.1 pg/mL — AB (ref 0.0–100.0)

## 2017-11-08 LAB — MRSA PCR SCREENING: MRSA BY PCR: POSITIVE — AB

## 2017-11-08 MED ORDER — KETOROLAC TROMETHAMINE 15 MG/ML IJ SOLN
15.0000 mg | Freq: Four times a day (QID) | INTRAMUSCULAR | Status: AC | PRN
  Administered 2017-11-08: 15 mg via INTRAVENOUS
  Filled 2017-11-08: qty 1

## 2017-11-08 MED ORDER — CEPHALEXIN 500 MG PO CAPS
500.0000 mg | ORAL_CAPSULE | Freq: Two times a day (BID) | ORAL | Status: AC
  Filled 2017-11-08: qty 1

## 2017-11-08 MED ORDER — ACETAMINOPHEN 325 MG PO TABS
650.0000 mg | ORAL_TABLET | Freq: Two times a day (BID) | ORAL | Status: DC | PRN
  Administered 2017-11-09 – 2017-11-10 (×2): 650 mg via ORAL
  Filled 2017-11-08 (×2): qty 2

## 2017-11-08 MED ORDER — SERTRALINE HCL 25 MG PO TABS
25.0000 mg | ORAL_TABLET | Freq: Every day | ORAL | Status: DC
  Administered 2017-11-08 – 2017-11-14 (×6): 25 mg via ORAL
  Filled 2017-11-08 (×8): qty 1

## 2017-11-08 MED ORDER — CALCIUM CARBONATE 1250 (500 CA) MG PO TABS
1250.0000 mg | ORAL_TABLET | Freq: Every day | ORAL | Status: DC
  Administered 2017-11-10 – 2017-11-15 (×6): 1250 mg via ORAL
  Filled 2017-11-08 (×6): qty 1

## 2017-11-08 MED ORDER — TAB-A-VITE/IRON PO TABS
1.0000 | ORAL_TABLET | Freq: Every day | ORAL | Status: DC
  Administered 2017-11-09 – 2017-11-15 (×7): 1 via ORAL
  Filled 2017-11-08 (×7): qty 1

## 2017-11-08 MED ORDER — APIXABAN 2.5 MG PO TABS
2.5000 mg | ORAL_TABLET | Freq: Two times a day (BID) | ORAL | Status: DC
  Administered 2017-11-08 – 2017-11-15 (×11): 2.5 mg via ORAL
  Filled 2017-11-08 (×14): qty 1

## 2017-11-08 MED ORDER — FLUTICASONE PROPIONATE 50 MCG/ACT NA SUSP
1.0000 | Freq: Every day | NASAL | Status: DC
  Administered 2017-11-09 – 2017-11-14 (×6): 1 via NASAL
  Filled 2017-11-08: qty 16

## 2017-11-08 MED ORDER — POLYETHYLENE GLYCOL 3350 17 G PO PACK: 17.0000 g | PACK | Freq: Every day | ORAL | Status: DC | PRN

## 2017-11-08 MED ORDER — VERAPAMIL HCL ER 240 MG PO TBCR
240.0000 mg | EXTENDED_RELEASE_TABLET | Freq: Every day | ORAL | Status: DC
  Administered 2017-11-08 – 2017-11-14 (×6): 240 mg via ORAL
  Filled 2017-11-08 (×8): qty 1

## 2017-11-08 MED ORDER — ALBUTEROL SULFATE (2.5 MG/3ML) 0.083% IN NEBU: 3.0000 mL | INHALATION_SOLUTION | RESPIRATORY_TRACT | Status: DC | PRN

## 2017-11-08 MED ORDER — PSYLLIUM 95 % PO PACK
1.0000 | PACK | Freq: Every day | ORAL | Status: DC
  Administered 2017-11-11 – 2017-11-15 (×5): 1 via ORAL
  Filled 2017-11-08 (×7): qty 1

## 2017-11-08 MED ORDER — SPIRONOLACTONE 12.5 MG HALF TABLET
12.5000 mg | ORAL_TABLET | Freq: Every day | ORAL | Status: DC
  Filled 2017-11-08: qty 1

## 2017-11-08 MED ORDER — ONDANSETRON HCL 4 MG PO TABS: 4.0000 mg | ORAL_TABLET | Freq: Four times a day (QID) | ORAL | Status: DC | PRN

## 2017-11-08 MED ORDER — FUROSEMIDE 20 MG PO TABS: 20.0000 mg | ORAL_TABLET | Freq: Every day | ORAL | Status: DC

## 2017-11-08 MED ORDER — ATENOLOL 25 MG PO TABS
25.0000 mg | ORAL_TABLET | Freq: Every day | ORAL | Status: DC
  Administered 2017-11-08 – 2017-11-14 (×6): 25 mg via ORAL
  Filled 2017-11-08 (×7): qty 1

## 2017-11-08 MED ORDER — SENNA 8.6 MG PO TABS
1.0000 | ORAL_TABLET | ORAL | Status: DC
  Administered 2017-11-11 – 2017-11-15 (×3): 8.6 mg via ORAL
  Filled 2017-11-08 (×3): qty 1

## 2017-11-08 MED ORDER — LORATADINE 10 MG PO TABS
10.0000 mg | ORAL_TABLET | Freq: Every day | ORAL | Status: DC
  Administered 2017-11-09 – 2017-11-15 (×7): 10 mg via ORAL
  Filled 2017-11-08 (×7): qty 1

## 2017-11-08 MED ORDER — LORAZEPAM 2 MG/ML IJ SOLN
1.0000 mg | Freq: Once | INTRAMUSCULAR | Status: AC
  Administered 2017-11-09: 1 mg via INTRAVENOUS
  Filled 2017-11-08: qty 1

## 2017-11-08 MED ORDER — AEROCHAMBER PLUS FLO-VU MEDIUM MISC: 1.0000 | Freq: Once | Status: DC

## 2017-11-08 MED ORDER — HYDROCODONE-ACETAMINOPHEN 5-325 MG PO TABS
0.5000 | ORAL_TABLET | Freq: Two times a day (BID) | ORAL | Status: DC
  Administered 2017-11-08 – 2017-11-14 (×8): 0.5 via ORAL
  Filled 2017-11-08 (×11): qty 1

## 2017-11-08 NOTE — H&P (Signed)
History and Physical    [name] XLK:[id number] DOB: [birth year] DOA: 11/08/2017  PCP: Reymundo Poll, MD  Patient coming from: Skilled nursing facility  I have personally briefly reviewed patient's old medical records in [place]  Chief Complaint: Confusion and low-grade fever with heme positive stools noted today  HPI: Alexandra Luna is a 84 y.o. female with medical history significant of hemorrhoids, anxiety, atrial fibrillation on chronic anticoagulation with Eliquis, remote breast cancer, renovascular hypertension, diabetes, and dementia who presents the emergency department sent in by skilled nursing facility.  Unsure why that she is here she states that she feels okay.  She has been weak over the past week and further nursing facility had some blood in her stool.  Patient cannot verify this.  She is awake and pleasant but very confused.  I personally called her daughter, Alexandra Luna.  Manuela Schwartz is currently visiting her grandchild in [place] but reports that her mother has a history of hemorrhoids.  That this degree of confusion is more than what was previously her baseline.  Is aware that she had been treated for a cellulitis on her lower extremity.  She is due to complete her antibiotics in 2 more doses.  And that the patient is anticoagulated for her atrial fibrillation has heme positive stools we will monitor her overnight.  Also will keep an eye on her white blood cell count and her fever curve.    Review of Systems: Patient unable due to confusion  Past Medical History:  Diagnosis Date  . Anxiety   . Atrial fibrillation (HCC)    On chronic Eliquis  . Breast cancer ([place]) dx'd 1997   left; lt lumpectomy and XRT  . Mixed hyperlipidemia   . Palpitations   . Pre-diabetes   . Renal hypertension 03/07/2005   no evidence of significant diameter reduction, dissection, tortuosity, FMD or orther vascular abnormality; kidneys equal in size, symmetry, with normal cortex and  medulla    Past Surgical History:  Procedure Laterality Date  . BREAST LUMPECTOMY    . BREAST LUMPECTOMY  1998   left side  . CARDIOVASCULAR STRESS TEST  526/2011   R/Dipyridamole - EF 71%; normal myocaridal perfusion w/o evidence of ischemia or infarct; no schintigraphic evidence of inducible myocaridal ischemia; global LV function normal; EKG negative for ischemia; low risk scan   . CARDIOVERSION N/A 11/12/2012   Procedure: CARDIOVERSION;  Surgeon: Troy Sine, MD;  Location: [facility];  Service: Cardiovascular;  Laterality: N/A;  . CESAREAN SECTION     x 3  . DOPPLER ECHOCARDIOGRAPHY  08/29/2011   EF >36%;  LV systolic fcn normal; doppler flow pattern suggestive of impaired LV relaxation; LA mildly dilated; aortic valve mildly sclerotic, no aortic stenosis  . tonsillectomy      Social History   Social History Narrative   Widowed.  Lives alone.  Does not have any home health services (hasn't wanted anyone in the house).  Ambulates with a cane or walker.     reports that she quit smoking about 29 years ago. Her smoking use included cigarettes. She has a 7.50 pack-year smoking history. She has never used smokeless tobacco. She reports that she drinks alcohol. She reports that she does not use drugs.  Allergies  Allergen Reactions  . Influenza Vaccines Other (See Comments)    Pt's arm swelled and dr told her not to take vaccine again.  [place] Fleeting [Trandolapril-Verapamil Hcl Er] Other (See Comments)    Increase  blood pressure  . Adhesive [Tape] Rash    Adhesive bandaids   . Azithromycin Rash  . Lipitor [Atorvastatin] Rash  . Penicillins Rash    Family History  Problem Relation Age of Onset  . Heart failure Mother   . Breast cancer Mother   . Hypertension Father   . Kidney cancer Father   . Stroke Maternal Grandmother      Prior to Admission medications   Medication Sig Start Date End Date Taking? Authorizing Provider  acetaminophen (TYLENOL) 325 MG tablet Take 650  mg by mouth 2 (two) times daily as needed for mild pain.   Yes [provider]  albuterol (PROVENTIL HFA;VENTOLIN HFA) 108 (90 Base) MCG/ACT inhaler Inhale 1 puff into the lungs every 4 (four) hours as needed for wheezing or shortness of breath. 04/03/15  Yes Nita Sells, MD  apixaban (ELIQUIS) 2.5 MG TABS tablet Take 1 tablet (2.5 mg total) by mouth 2 (two) times daily. 12/23/13  Yes Troy Sine, MD  atenolol (TENORMIN) 25 MG tablet Take 1 tablet (25 mg total) by mouth at bedtime. 07/01/14  Yes Robbie Lis, MD  calcium carbonate (OS-CAL) 600 MG TABS Take 600 mg by mouth daily.    Yes [provider]  cephALEXin (KEFLEX) 500 MG capsule Take 2 capsules (1,000 mg total) by mouth 2 (two) times daily. 11/02/17  Yes Charlesetta Shanks, MD  fexofenadine (ALLEGRA) 180 MG tablet Take 180 mg by mouth daily.   Yes [provider]  fluticasone (FLONASE) 50 MCG/ACT nasal spray Place 1 spray into both nostrils at bedtime.  07/04/12  Yes [provider]  furosemide (LASIX) 20 MG tablet Take 1 tablet (20 mg total) by mouth daily. 08/15/14  Yes Robbie Lis, MD  HYDROcodone-acetaminophen (NORCO/VICODIN) 5-325 MG tablet Take 0.5 tablets by mouth 2 (two) times daily.    Yes [provider]  Multiple Vitamins-Iron (MULTIVITAMINS WITH IRON) TABS tablet Take 1 tablet by mouth daily.   Yes [provider]  ondansetron (ZOFRAN) 4 MG tablet Take 1 tablet (4 mg total) by mouth every 6 (six) hours as needed for nausea. 08/15/14  Yes Robbie Lis, MD  Psyllium 500 MG CAPS Take 500 mg by mouth daily.   Yes [provider]  senna (SENOKOT) 8.6 MG TABS tablet Take 1 tablet by mouth 3 (three) times a week. Monday, Wednesday and Friday   Yes [provider]  sertraline (ZOLOFT) 25 MG tablet Take 25 mg by mouth at bedtime. 10/14/17  Yes [provider]  Spacer/Aero-Holding Chambers (AEROCHAMBER PLUS FLO-VU MEDIUM) MISC 1 each by Other route once.  04/03/15  Yes Nita Sells, MD  spironolactone (ALDACTONE) 25 MG tablet Take 12.5 mg by mouth daily.  02/17/15  Yes [provider]  verapamil (VERELAN PM) 240 MG 24 hr capsule Take 1 capsule (240 mg total) by mouth at bedtime. 05/03/14  Yes Troy Sine, MD  metoCLOPramide (REGLAN) 10 MG tablet Take 1 tablet (10 mg total) by mouth every 8 (eight) hours as needed for nausea. Patient not taking: Reported on 11/08/2017 03/21/15   Harvel Quale, MD  rosuvastatin (CRESTOR) 10 MG tablet Take 1 tablet (10 mg total) by mouth every other day. Patient not taking: Reported on 11/08/2017 05/10/14   Troy Sine, MD  traMADol (ULTRAM) 50 MG tablet Take 1 tablet (50 mg total) by mouth every 6 (six) hours as needed. Patient not taking: Reported on 11/08/2017 08/25/16   Milton Ferguson, MD  Physical Exam:  Constitutional: NAD, calm, comfortable confused keeps trying to hand me the pulse ox monitor Vitals:   11/08/17 0855 11/08/17 1115 11/08/17 1222 11/08/17 1226  BP:  120/67  127/89  Pulse:    (!) 102  Resp:  (!) 24  (!) 22  Temp:   100 F (37.8 C)   TempSrc:   Rectal   SpO2:  92%  94%  Weight: 79.4 kg     Height: 5\' 8"  (1.727 m)      Eyes: PERRL, lids and conjunctivae normal ENMT: Mucous membranes are moist. Posterior pharynx clear of any exudate or lesions.Normal dentition.  Neck: normal, supple, no masses, no thyromegaly Respiratory: clear to auscultation bilaterally, no wheezing, no crackles. Normal respiratory effort. No accessory muscle use.  Cardiovascular: Regular rate and rhythm, no murmurs / rubs / gallops. No extremity edema. 2+ pedal pulses. No carotid bruits.  Abdomen: no tenderness, no masses palpated. No hepatosplenomegaly. Bowel sounds positive.  Musculoskeletal: no clubbing / cyanosis. No joint deformity upper and lower extremities. Good ROM, no contractures. Normal muscle tone.  Skin: no rashes, lesions, ulcers. No induration Neurologic: CN 2-12 grossly intact.  Sensation intact, DTR normal. Strength 5/5 in all 4.  Psychiatric: Alert and disoriented but slightly agitated and confused, mood is normal judgment is impaired   Labs on Admission: I have personally reviewed following labs and imaging studies  CBC: Recent Labs  Lab 11/02/17 1458 11/08/17 0957  WBC 10.2 14.7*  NEUTROABS  --  12.8*  HGB 12.0 12.7  HCT 38.2 39.7  MCV 94.1 93.2  PLT 216 295   Basic Metabolic Panel: Recent Labs  Lab 11/02/17 1458 11/08/17 0957  NA 133* 133*  K 4.4 4.3  CL 98 97*  CO2 26 22  GLUCOSE 102* 115*  BUN 32* 26*  CREATININE 1.19* 1.01*  CALCIUM 9.9 10.1   GFR: Estimated Creatinine Clearance: 42.6 mL/min (A) (by C-G formula based on SCr of 1.01 mg/dL (H)). Liver Function Tests: Recent Labs  Lab 11/02/17 1458 11/08/17 0957  AST 23 32  ALT 17 24  ALKPHOS 92 89  BILITOT 1.0 1.6*  PROT 6.7 6.7  ALBUMIN 3.3* 3.3*   Cardiac Enzymes: Recent Labs  Lab 11/08/17 0957  TROPONINI 0.04*   CBG: Recent Labs  Lab 11/02/17 1605  GLUCAP 91   Urine analysis:    Component Value Date/Time   COLORURINE AMBER (A) 11/08/2017 1206   APPEARANCEUR CLOUDY (A) 11/08/2017 1206   LABSPEC 1.026 11/08/2017 1206   PHURINE 5.0 11/08/2017 1206   GLUCOSEU NEGATIVE 11/08/2017 1206   HGBUR NEGATIVE 11/08/2017 1206   BILIRUBINUR NEGATIVE 11/08/2017 1206   KETONESUR 5 (A) 11/08/2017 1206   PROTEINUR 30 (A) 11/08/2017 1206   UROBILINOGEN 0.2 06/27/2014 1305   NITRITE NEGATIVE 11/08/2017 1206   LEUKOCYTESUR NEGATIVE 11/08/2017 1206    Radiological Exams on Admission: Dg Chest 2 View  Result Date: 11/08/2017 CLINICAL DATA:  Shortness of breath.  History of atrial fibrillation EXAM: CHEST - 2 VIEW COMPARISON:  November 01, 2016 FINDINGS: Lungs are clear. There is moderate cardiomegaly with pulmonary vascularity normal. No adenopathy. There is aortic atherosclerosis. There is postoperative change in the left axillary region. Patient has had previous partial mastectomy  on the left. IMPRESSION: No edema or consolidation. There is cardiomegaly. There is aortic atherosclerosis. Postoperative change left breast region with surgical clips left axillary region. No evident adenopathy. Aortic Atherosclerosis (ICD10-I70.0). Electronically Signed   By: Lowella Grip III M.D.   On: 11/08/2017  10:04    EKG: Independently reviewed.  Atrial fibrillation with low voltages, right ventricular hypertrophy, Borderline T wave abnormalities in the inferior leads, prolonged QT interval.  Assessment/Plan Principal Problem:   Acute metabolic encephalopathy Active Problems:   Blood in stool   Elevated temperature   Leukocytosis   AF (atrial fibrillation) (HCC)   Chronic anticoagulation   Generalized weakness   History of breast cancer in female   Essential hypertension   Metabolic encephalopathy   1.  Acute metabolic encephalopathy: Patient's confusion is worse compared with her baseline.  Currently we do not have an etiology.  Has an elevated white blood cell count, and slightly elevated temperature, she is occult blood positive and has been for some time because she has a history of hemorrhoids, we elevated troponin.  Given this constellation of signs and symptoms I will place the patient in overnight observation and monitor her hemoglobin, fever curve, and troponins.  We will rule her out for myocardial infarction with serial enzymes and EKGs.  2.  Blood in stool: Patient with a long history of blood in her stool.  She is a DNR.  Would discuss with her daughter prior to planning any type of invasive treatment.  3.  Elevated temperature: Noted.  Will monitor closely.  Follow temperature curve.  4.  Leukocytosis: No obvious source of infection will continue to monitor.  5.  Elevated troponin: This could possibly be an non-STEMI with the elevated temperature and the white blood cell count.  EKG is unremarkable.  We will cycle serial enzymes.  6.  Atrial fibrillation:  Continue rate control and treatment with Eliquis.  Though patient is bleeding her hemoglobin is 12.  She has a long history of heme positive stool.  Will follow hemoglobin if drops will consider discontinuing Eliquis.  7.  Generalized weakness: Etiology is unclear but may be related to elevated temperatures and white blood cell count will monitor.  8.  History of breast cancer in her female: Noted.  In remission for many years.  9.  Essential hypertension: Continue home medication.  Monitor closely.    DVT prophylaxis: Anticoagulated on Eliquis Code Status: Do Not resuscitate affirmed by her daughter Alexandra Luna. Family Communication: Spoke with Alexandra Luna by telephone at the number we have for her on the face sheet in the chart.  She is currently in [place].  She will notify her sister who is also listed as a contact. Disposition Plan: Back to skilled facility when stable Consults called: None at present. Admission status: Sedation   Lady Deutscher MD FACP [facility] Pager [phone]  If 7PM-7AM, please contact night-coverage [email or web address] Password [facility]  11/08/2017, 2:47 PM

## 2017-11-08 NOTE — ED Notes (Signed)
Urine culture sent to lab with UA. 

## 2017-11-08 NOTE — ED Triage Notes (Signed)
Patient presents to the ED via [facility] , states she has been weak x 1 week. Went to bathroom this am and has bright red rectal bleeding.c/o lower back pain , left thigh pain. Denies fall.

## 2017-11-08 NOTE — ED Provider Notes (Signed)
[facility] EMERGENCY DEPARTMENT Provider Note   CSN: [id number] Arrival date & time: 11/08/17  0847     History   Chief Complaint Chief Complaint  Patient presents with  . Weakness  . Rectal Bleeding    HPI Alexandra Luna is a 84 y.o. female. Level 5 caveat due to altered mental status. HPI Patient is unsure why she is here.  States that she feels okay.  Notes say that she has been weak over the last week.  Reportedly had blood in the stool.  Patient cannot really verify this.  Awake and pleasant but may have some confusion. Past Medical History:  Diagnosis Date  . Anxiety   . Atrial fibrillation (HCC)    On chronic Eliquis  . Breast cancer ([place]) dx'd 1997   left; lt lumpectomy and XRT  . Mixed hyperlipidemia   . Palpitations   . Pre-diabetes   . Renal hypertension 03/07/2005   no evidence of significant diameter reduction, dissection, tortuosity, FMD or orther vascular abnormality; kidneys equal in size, symmetry, with normal cortex and medulla    Patient Active Problem List   Diagnosis Date Noted  . CHF exacerbation ([place]) 11/01/2016  . Weakness 09/06/2016  . Chronic diastolic CHF (congestive heart failure) ([place]) 09/06/2016  . ARF (acute renal failure) ([place]) 09/06/2016  . Weakness generalized 09/06/2016  . COPD exacerbation ([place]) 04/01/2015  . Bilateral pleural effusion 08/30/2014  . Multiple pulmonary nodules 08/30/2014  . Acute diastolic congestive heart failure ([place]) 08/14/2014  . Acute respiratory failure with hypoxia ([place]) 08/14/2014  . Generalized weakness 06/28/2014  . Chronic anticoagulation 12/08/2012  . AF (atrial fibrillation) ([place]) 09/22/2012  . Essential hypertension 09/22/2012  . Hyperlipidemia, mixed 09/22/2012  . History of breast cancer in female 04/28/2012    Past Surgical History:  Procedure Laterality Date  . BREAST LUMPECTOMY    . BREAST LUMPECTOMY  1998   left side  . CARDIOVASCULAR STRESS TEST  526/2011   R/Dipyridamole - EF 71%; normal myocaridal perfusion w/o evidence of ischemia or infarct; no schintigraphic evidence of inducible myocaridal ischemia; global LV function normal; EKG negative for ischemia; low risk scan   . CARDIOVERSION N/A 11/12/2012   Procedure: CARDIOVERSION;  Surgeon: Troy Sine, MD;  Location: [facility];  Service: Cardiovascular;  Laterality: N/A;  . CESAREAN SECTION     x 3  . DOPPLER ECHOCARDIOGRAPHY  08/29/2011   EF >88%;  LV systolic fcn normal; doppler flow pattern suggestive of impaired LV relaxation; LA mildly dilated; aortic valve mildly sclerotic, no aortic stenosis  . tonsillectomy       OB History   None      Home Medications    Prior to Admission medications   Medication Sig Start Date End Date Taking? Authorizing Provider  acetaminophen (TYLENOL) 325 MG tablet Take 650 mg by mouth 2 (two) times daily as needed for mild pain.   Yes [provider]  albuterol (PROVENTIL HFA;VENTOLIN HFA) 108 (90 Base) MCG/ACT inhaler Inhale 1 puff into the lungs every 4 (four) hours as needed for wheezing or shortness of breath. 04/03/15  Yes Nita Sells, MD  apixaban (ELIQUIS) 2.5 MG TABS tablet Take 1 tablet (2.5 mg total) by mouth 2 (two) times daily. 12/23/13  Yes Troy Sine, MD  atenolol (TENORMIN) 25 MG tablet Take 1 tablet (25 mg total) by mouth at bedtime. 07/01/14  Yes Robbie Lis, MD  calcium carbonate (OS-CAL) 600 MG TABS Take 600 mg by mouth daily.  Yes [provider]  cephALEXin (KEFLEX) 500 MG capsule Take 2 capsules (1,000 mg total) by mouth 2 (two) times daily. 11/02/17  Yes Charlesetta Shanks, MD  fexofenadine (ALLEGRA) 180 MG tablet Take 180 mg by mouth daily.   Yes [provider]  fluticasone (FLONASE) 50 MCG/ACT nasal spray Place 1 spray into both nostrils at bedtime.  07/04/12  Yes [provider]  furosemide (LASIX) 20 MG tablet Take 1 tablet (20 mg total) by mouth daily. 08/15/14  Yes Robbie Lis,  MD  HYDROcodone-acetaminophen (NORCO/VICODIN) 5-325 MG tablet Take 0.5 tablets by mouth 2 (two) times daily.    Yes [provider]  Multiple Vitamins-Iron (MULTIVITAMINS WITH IRON) TABS tablet Take 1 tablet by mouth daily.   Yes [provider]  ondansetron (ZOFRAN) 4 MG tablet Take 1 tablet (4 mg total) by mouth every 6 (six) hours as needed for nausea. 08/15/14  Yes Robbie Lis, MD  Psyllium 500 MG CAPS Take 500 mg by mouth daily.   Yes [provider]  senna (SENOKOT) 8.6 MG TABS tablet Take 1 tablet by mouth 3 (three) times a week. Monday, Wednesday and Friday   Yes [provider]  sertraline (ZOLOFT) 25 MG tablet Take 25 mg by mouth at bedtime. 10/14/17  Yes [provider]  Spacer/Aero-Holding Chambers (AEROCHAMBER PLUS FLO-VU MEDIUM) MISC 1 each by Other route once. 04/03/15  Yes Nita Sells, MD  spironolactone (ALDACTONE) 25 MG tablet Take 12.5 mg by mouth daily.  02/17/15  Yes [provider]  verapamil (VERELAN PM) 240 MG 24 hr capsule Take 1 capsule (240 mg total) by mouth at bedtime. 05/03/14  Yes Troy Sine, MD  metoCLOPramide (REGLAN) 10 MG tablet Take 1 tablet (10 mg total) by mouth every 8 (eight) hours as needed for nausea. Patient not taking: Reported on 11/08/2017 03/21/15   Harvel Quale, MD  rosuvastatin (CRESTOR) 10 MG tablet Take 1 tablet (10 mg total) by mouth every other day. Patient not taking: Reported on 11/08/2017 05/10/14   Troy Sine, MD  traMADol (ULTRAM) 50 MG tablet Take 1 tablet (50 mg total) by mouth every 6 (six) hours as needed. Patient not taking: Reported on 11/08/2017 08/25/16   Milton Ferguson, MD    Family History Family History  Problem Relation Age of Onset  . Heart failure Mother   . Breast cancer Mother   . Hypertension Father   . Kidney cancer Father   . Stroke Maternal Grandmother     Social History Social History   Tobacco Use  . Smoking status: Former Smoker     Packs/day: 0.75    Years: 10.00    Pack years: 7.50    Types: Cigarettes    Last attempt to quit: 04/02/1988    Years since quitting: 29.6  . Smokeless tobacco: Never Used  Substance Use Topics  . Alcohol use: Yes    Alcohol/week: 0.0 standard drinks    Comment: 1/4 glass of wine every 2 weeks  . Drug use: No     Allergies   Influenza vaccines; [place]a [trandolapril-verapamil hcl er]; Adhesive [tape]; Azithromycin; Lipitor [atorvastatin]; and Penicillins   Review of Systems Review of Systems  Unable to perform ROS: Mental status change     Physical Exam Updated Vital Signs BP 127/89   Pulse (!) 102   Temp 100 F (37.8 C) (Rectal)   Resp (!) 22   Ht 5\' 8"  (1.727 m)   Wt 79.4 kg  SpO2 94%   BMI 26.61 kg/m   Physical Exam  Constitutional: She appears well-developed.  HENT:  Head: Atraumatic.  Neck: Neck supple.  Cardiovascular: Normal rate.  Pulmonary/Chest: Effort normal.  Abdominal: There is no tenderness.  Musculoskeletal: She exhibits no tenderness.  Neurological: She is alert.  Patient is alert but appears somewhat confused.  Says she is not sure why she is here.  Will move all extremities.  Skin: Capillary refill takes less than 2 seconds.     ED Treatments / Results  Labs (all labs ordered are listed, but only abnormal results are displayed) Labs Reviewed  TROPONIN I - Abnormal; Notable for the following components:      Result Value   Troponin I 0.04 (*)    All other components within normal limits  COMPREHENSIVE METABOLIC PANEL - Abnormal; Notable for the following components:   Sodium 133 (*)    Chloride 97 (*)    Glucose, Bld 115 (*)    BUN 26 (*)    Creatinine, Ser 1.01 (*)    Albumin 3.3 (*)    Total Bilirubin 1.6 (*)    GFR calc non Af Amer 48 (*)    GFR calc Af Amer 56 (*)    All other components within normal limits  CBC WITH DIFFERENTIAL/PLATELET - Abnormal; Notable for the following components:   WBC 14.7 (*)    RDW 16.2 (*)     Neutro Abs 12.8 (*)    All other components within normal limits  URINALYSIS, ROUTINE W REFLEX MICROSCOPIC - Abnormal; Notable for the following components:   Color, Urine AMBER (*)    APPearance CLOUDY (*)    Ketones, ur 5 (*)    Protein, ur 30 (*)    All other components within normal limits  POC OCCULT BLOOD, ED - Abnormal; Notable for the following components:   Fecal Occult Bld POSITIVE (*)    All other components within normal limits    EKG EKG Interpretation  Date/Time:  Friday November 08 2017 08:54:26 EDT Ventricular Rate:  99 PR Interval:    QRS Duration: 96 QT Interval:  370 QTC Calculation: 503 R Axis:   113 Text Interpretation:  Atrial fibrillation Low voltage, precordial leads Probable right ventricular hypertrophy Borderline T abnormalities, inferior leads Prolonged QT interval Confirmed by Davonna Belling [phone]) on 11/08/2017 9:00:14 AM   Radiology Dg Chest 2 View  Result Date: 11/08/2017 CLINICAL DATA:  Shortness of breath.  History of atrial fibrillation EXAM: CHEST - 2 VIEW COMPARISON:  November 01, 2016 FINDINGS: Lungs are clear. There is moderate cardiomegaly with pulmonary vascularity normal. No adenopathy. There is aortic atherosclerosis. There is postoperative change in the left axillary region. Patient has had previous partial mastectomy on the left. IMPRESSION: No edema or consolidation. There is cardiomegaly. There is aortic atherosclerosis. Postoperative change left breast region with surgical clips left axillary region. No evident adenopathy. Aortic Atherosclerosis (ICD10-I70.0). Electronically Signed   By: Lowella Grip III M.D.   On: 11/08/2017 10:04    Procedures Procedures (including critical care time)  Medications Ordered in ED Medications - No data to display   Initial Impression / Assessment and Plan / ED Course  I have reviewed the triage vital signs and the nursing notes.  Pertinent labs & imaging results that were available during my  care of the patient were reviewed by me and considered in my medical decision making (see chart for details).     Patient brought in reportedly for weakness  and GI bleeding.  Is guaiac positive and on anticoagulation, although her hemoglobin looks good at this time.  Does have a low-grade temperature of 100 degrees rectally.  Patient appears to be confused pass her baseline.  Has been mentioning that she thinks she is going to die.  She is a DNR.  With encephalopathy and the bleeding on anticoagulation will admit to hospitalist.  Final Clinical Impressions(s) / ED Diagnoses   Final diagnoses:  Encephalopathy  Gastrointestinal hemorrhage, unspecified gastrointestinal hemorrhage type    ED Discharge Orders    None       Davonna Belling, MD 11/08/17 1406

## 2017-11-08 NOTE — ED Notes (Signed)
Pt having more frequent episodes of agitation and increased respirations. MD made aware and at bedside to reevaluate the patient.

## 2017-11-09 ENCOUNTER — Observation Stay (HOSPITAL_COMMUNITY): Payer: Medicare Other

## 2017-11-09 DIAGNOSIS — Z515 Encounter for palliative care: Secondary | ICD-10-CM | POA: Diagnosis not present

## 2017-11-09 DIAGNOSIS — R531 Weakness: Secondary | ICD-10-CM

## 2017-11-09 DIAGNOSIS — Z7901 Long term (current) use of anticoagulants: Secondary | ICD-10-CM | POA: Diagnosis not present

## 2017-11-09 DIAGNOSIS — G9341 Metabolic encephalopathy: Secondary | ICD-10-CM | POA: Diagnosis not present

## 2017-11-09 DIAGNOSIS — N179 Acute kidney failure, unspecified: Secondary | ICD-10-CM | POA: Diagnosis not present

## 2017-11-09 DIAGNOSIS — I10 Essential (primary) hypertension: Secondary | ICD-10-CM

## 2017-11-09 DIAGNOSIS — D72829 Elevated white blood cell count, unspecified: Secondary | ICD-10-CM

## 2017-11-09 DIAGNOSIS — K921 Melena: Secondary | ICD-10-CM | POA: Diagnosis not present

## 2017-11-09 LAB — BASIC METABOLIC PANEL
ANION GAP: 13 (ref 5–15)
BUN: 37 mg/dL — ABNORMAL HIGH (ref 8–23)
CALCIUM: 9.6 mg/dL (ref 8.9–10.3)
CHLORIDE: 99 mmol/L (ref 98–111)
CO2: 21 mmol/L — AB (ref 22–32)
Creatinine, Ser: 1.5 mg/dL — ABNORMAL HIGH (ref 0.44–1.00)
GFR calc non Af Amer: 30 mL/min — ABNORMAL LOW (ref >60)
GFR, EST AFRICAN AMERICAN: 35 mL/min — AB (ref >60)
Glucose, Bld: 106 mg/dL — ABNORMAL HIGH (ref 70–99)
Potassium: 4.8 mmol/L (ref 3.5–5.1)
Sodium: 133 mmol/L — ABNORMAL LOW (ref 135–145)

## 2017-11-09 LAB — URINALYSIS, ROUTINE W REFLEX MICROSCOPIC
BACTERIA UA: NONE SEEN
Bilirubin Urine: NEGATIVE
Glucose, UA: NEGATIVE mg/dL
Hgb urine dipstick: NEGATIVE
KETONES UR: NEGATIVE mg/dL
Leukocytes, UA: NEGATIVE
Nitrite: NEGATIVE
PROTEIN: 30 mg/dL — AB
Specific Gravity, Urine: 1.025 (ref 1.005–1.030)
pH: 5 (ref 5.0–8.0)

## 2017-11-09 LAB — TROPONIN I: Troponin I: 0.05 ng/mL (ref <0.03)

## 2017-11-09 LAB — CBC
HCT: 36 % (ref 36.0–46.0)
HEMOGLOBIN: 11.7 g/dL — AB (ref 12.0–15.0)
MCH: 30.3 pg (ref 26.0–34.0)
MCHC: 32.5 g/dL (ref 30.0–36.0)
MCV: 93.3 fL (ref 78.0–100.0)
Platelets: 182 10*3/uL (ref 150–400)
RBC: 3.86 MIL/uL — AB (ref 3.87–5.11)
RDW: 16.2 % — ABNORMAL HIGH (ref 11.5–15.5)
WBC: 13.9 10*3/uL — ABNORMAL HIGH (ref 4.0–10.5)

## 2017-11-09 LAB — TSH: TSH: 3.236 u[IU]/mL (ref 0.350–4.500)

## 2017-11-09 LAB — SODIUM, URINE, RANDOM: Sodium, Ur: 19 mmol/L

## 2017-11-09 LAB — CREATININE, URINE, RANDOM: Creatinine, Urine: 236.41 mg/dL

## 2017-11-09 LAB — VITAMIN B12: Vitamin B-12: 792 pg/mL (ref 180–914)

## 2017-11-09 MED ORDER — RISPERIDONE 0.5 MG PO TBDP
0.5000 mg | ORAL_TABLET | Freq: Every evening | ORAL | Status: DC | PRN
  Administered 2017-11-10: 0.5 mg via ORAL
  Filled 2017-11-09 (×2): qty 1

## 2017-11-09 MED ORDER — FUROSEMIDE 10 MG/ML IJ SOLN: 20.0000 mg | Freq: Two times a day (BID) | INTRAMUSCULAR | Status: DC

## 2017-11-09 MED ORDER — SODIUM CHLORIDE 0.9 % IV SOLN
INTRAVENOUS | Status: DC
  Administered 2017-11-09 – 2017-11-14 (×8): via INTRAVENOUS

## 2017-11-09 NOTE — Progress Notes (Signed)
Pt more awake now after the Foley catheter inserted.  I suspect the Ativan from last night is wearing off now.  Pt requesting something to drink, given.

## 2017-11-09 NOTE — Progress Notes (Addendum)
PROGRESS NOTE    Alexandra Luna  QQV:[id number] DOB: 18-Jan-1927 DOA: 11/08/2017 PCP: Reymundo Poll, MD    Brief Narrative Patient is a 84 year old female history of hemorrhoids, anxiety, A. fib on chronic anticoagulation with Eliquis, history of remote breast cancer, renovascular hypertension, diabetes, dementia presents to the ED from skilled nursing facility secondary to worsening confusion.  Patient recently treated for cellulitis of the lower extremity.   Assessment & Plan:   Principal Problem:   Acute metabolic encephalopathy Active Problems:   History of breast cancer in female   AF (atrial fibrillation) (HCC)   Essential hypertension   Chronic anticoagulation   Generalized weakness   Blood in stool   Elevated temperature   Leukocytosis   Metabolic encephalopathy  #1 acute metabolic encephalopathy Questionable etiology.  Patient presented from nursing facility with worsening confusion after recently being treated for cellulitis.  Patient with a baseline dementia and baseline unknown.  Patient drowsy on my examination.  Patient supposedly had received a dose of Ativan overnight.  Work-up so far has been unremarkable.  Chest x-ray negative.  Will check a UA with cultures and sensitivities to rule out a UTI.  Check a CT head chip.  Check a RPR, RBC folate, vitamin B12 level, TSH.  Patient does have an elevated BNP however does look clinically dry on examination.  Will place on gentle hydration and follow.  2.??  Rectal bleeding Per admitting physician patient with a long history of blood in his stool.  Patient is DNR.  H&H currently stable.  No overt bleeding noted overnight.  Patient also with a history of hemorrhoids.  Patient on anticoagulation with Eliquis.  Will follow for now.  3.  Leukocytosis Leukocytosis however no source of infection noted.  Chest x-ray negative.  Check a UA with cultures and sensitivities.  Check blood cultures x2.  Hold off on IV antibiotics at this time.   Follow.  4.  Acute kidney injury Patient noted to have a creatinine of 1.50 from 1.01 on admission.  Patient looks clinically dry on examination.  Likely secondary to a prerenal azotemia in the setting of diuretics.  Discontinue diuretics..  Check a UA with cultures and sensitivities.  Check a fractional excretion of sodium.  Place on gentle hydration and follow.  5.  Atrial fibrillation Currently rate controlled on atenolol.  Continue Eliquis for anticoagulation.  H&H stable.  If significant drop in hemoglobin with no overt bleeding will discontinue Eliquis.  6.  Generalized weakness Questionable etiology.  PT/OT.    DVT prophylaxis: Eliquis Code Status: DNR Family Communication: No family at bedside. Disposition Plan: To be determined.  Likely back to skilled nursing facility once medically stable and back at baseline.   Consultants:   Palliative care pending  Procedures:   Chest x-ray 11/09/2017, 11/08/2017  Renal ultrasound 11/09/2017  Antimicrobials:   None   Subjective: Patient drowsy.  Seems confused.  Patient noted to have received Ativan last night.  No overt bleeding overnight noted.  Objective: Vitals:   11/08/17 1541 11/08/17 2101 11/09/17 0522 11/09/17 1434  BP: 118/66 (!) 105/54 (!) 160/90   Pulse: 86 85 (!) 117   Resp: (!) 22 20 (!) 22   Temp: 99 F (37.2 C) 98.2 F (36.8 C) 98 F (36.7 C)   TempSrc: Oral Oral Oral   SpO2: (!) 87% 92% 91%   Weight:    77.9 kg  Height:       No intake or output data in the 24  hours ending 11/09/17 1507 Filed Weights   11/08/17 0855 11/09/17 1434  Weight: 79.4 kg 77.9 kg    Examination:  General exam: Drowsy.  Respiratory system: Clear to auscultation bilaterally anterior lung fields. Respiratory effort normal. Cardiovascular system: S1 & S2 heard, RRR. No JVD, murmurs, rubs, gallops or clicks. No pedal edema. Gastrointestinal system: Abdomen is nondistended, soft and nontender. No organomegaly or masses felt.  Normal bowel sounds heard. Central nervous system: Drowsy.  Confused.  Moving extremities spontaneously.  Extremities: Symmetric 5 x 5 power. Skin: No rashes, lesions or ulcers Psychiatry: Judgement and insight appear poor. Mood & affect appropriate.     Data Reviewed: I have personally reviewed following labs and imaging studies  CBC: Recent Labs  Lab 11/08/17 0957 11/09/17 0503  WBC 14.7* 13.9*  NEUTROABS 12.8*  --   HGB 12.7 11.7*  HCT 39.7 36.0  MCV 93.2 93.3  PLT 221 893   Basic Metabolic Panel: Recent Labs  Lab 11/08/17 0957 11/09/17 0503  NA 133* 133*  K 4.3 4.8  CL 97* 99  CO2 22 21*  GLUCOSE 115* 106*  BUN 26* 37*  CREATININE 1.01* 1.50*  CALCIUM 10.1 9.6   GFR: Estimated Creatinine Clearance: 28.4 mL/min (A) (by C-G formula based on SCr of 1.5 mg/dL (H)). Liver Function Tests: Recent Labs  Lab 11/08/17 0957  AST 32  ALT 24  ALKPHOS 89  BILITOT 1.6*  PROT 6.7  ALBUMIN 3.3*   No results for input(s): LIPASE, AMYLASE in the last 168 hours. No results for input(s): AMMONIA in the last 168 hours. Coagulation Profile: No results for input(s): INR, PROTIME in the last 168 hours. Cardiac Enzymes: Recent Labs  Lab 11/08/17 0957 11/08/17 1457 11/08/17 2046 11/09/17 0503  TROPONINI 0.04* 0.05* 0.04* 0.05*   BNP (last 3 results) No results for input(s): PROBNP in the last 8760 hours. HbA1C: No results for input(s): HGBA1C in the last 72 hours. CBG: Recent Labs  Lab 11/02/17 1605  GLUCAP 91   Lipid Profile: No results for input(s): CHOL, HDL, LDLCALC, TRIG, CHOLHDL, LDLDIRECT in the last 72 hours. Thyroid Function Tests: No results for input(s): TSH, T4TOTAL, FREET4, T3FREE, THYROIDAB in the last 72 hours. Anemia Panel: No results for input(s): VITAMINB12, FOLATE, FERRITIN, TIBC, IRON, RETICCTPCT in the last 72 hours. Sepsis Labs: Recent Labs  Lab 11/02/17 1535  LATICACIDVEN 1.56    Recent Results (from the past 240 hour(s))  Urine  culture     Status: None   Collection Time: 11/02/17  4:02 PM  Result Value Ref Range Status   Specimen Description URINE, CATHETERIZED  Final   Special Requests NONE  Final   Culture   Final    NO GROWTH Performed at [facility], [place] [address] [place], [place] [address]    Report Status 11/03/2017 FINAL  Final  MRSA PCR Screening     Status: Abnormal   Collection Time: 11/08/17  3:54 PM  Result Value Ref Range Status   MRSA by PCR POSITIVE (A) NEGATIVE Final    Comment:        The GeneXpert MRSA Assay (FDA approved for NASAL specimens only), is one component of a comprehensive MRSA colonization surveillance program. It is not intended to diagnose MRSA infection nor to guide or monitor treatment for MRSA infections. RESULT CALLED TO, READ BACK BY AND VERIFIED WITH: Marjory Sneddon RN 11/08/17 1838 JDW Performed at [facility], [place] [address] [place], [place] [address]  Radiology Studies: Dg Chest 2 View  Result Date: 11/09/2017 CLINICAL DATA:  Hypoxia. EXAM: CHEST - 2 VIEW COMPARISON:  November 08, 2017 FINDINGS: Cardiomegaly. No pneumothorax. No pulmonary nodules, masses, or focal infiltrates. IMPRESSION: No active cardiopulmonary disease. Electronically Signed   By: Dorise Bullion III M.D   On: 11/09/2017 10:07   Dg Chest 2 View  Result Date: 11/08/2017 CLINICAL DATA:  Shortness of breath.  History of atrial fibrillation EXAM: CHEST - 2 VIEW COMPARISON:  November 01, 2016 FINDINGS: Lungs are clear. There is moderate cardiomegaly with pulmonary vascularity normal. No adenopathy. There is aortic atherosclerosis. There is postoperative change in the left axillary region. Patient has had previous partial mastectomy on the left. IMPRESSION: No edema or consolidation. There is cardiomegaly. There is aortic atherosclerosis. Postoperative change left breast region with surgical clips left axillary region. No evident adenopathy. Aortic Atherosclerosis (ICD10-I70.0).  Electronically Signed   By: Lowella Grip III M.D.   On: 11/08/2017 10:04   US Renal  Result Date: 11/09/2017 CLINICAL DATA:  Acute renal failure. EXAM: RENAL / URINARY TRACT ULTRASOUND COMPLETE COMPARISON:  CT scan March 20, 2015 FINDINGS: Right Kidney: Length: 10.9 cm. Echogenicity within normal limits. No mass or hydronephrosis visualized. Left Kidney: Length: 11.4 cm. Mildly limited evaluation due to body habitus. No abnormalities seen. Bladder: Appears normal for degree of bladder distention. IMPRESSION: Mildly limit evaluation of the left kidney due to body habitus. No abnormalities identified to explain the patient's acute renal failure. Electronically Signed   By: Dorise Bullion III M.D   On: 11/09/2017 10:07        Scheduled Meds: . apixaban  2.5 mg Oral BID  . atenolol  25 mg Oral QHS  . calcium carbonate  1,250 mg Oral Daily  . fluticasone  1 spray Each Nare QHS  . furosemide  20 mg Intravenous Q12H  . HYDROcodone-acetaminophen  0.5 tablet Oral BID  . loratadine  10 mg Oral Daily  . multivitamins with iron  1 tablet Oral Daily  . psyllium  1 packet Oral Daily  . senna  1 tablet Oral Once per day on Mon Wed Fri  . sertraline  25 mg Oral QHS  . spironolactone  12.5 mg Oral Daily  . verapamil  240 mg Oral QHS   Continuous Infusions:   LOS: 0 days    Time spent: 35 minutes    Irine Seal, MD [facility] Pager [phone]  If 7PM-7AM, please contact night-coverage [email or web address] Password [email or web address] 11/09/2017, 3:07 PM

## 2017-11-09 NOTE — Consult Note (Signed)
Consultation Note Date: 11/09/2017   Patient Name: Alexandra Luna  DOB: [birth year]  MRN: [id number]  Age / Sex: 84 y.o., female  PCP: Reymundo Poll, MD Referring Physician: Eugenie Filler, MD  Reason for Consultation: Establishing goals of care  HPI/Patient Profile: 84 y.o. female  with past medical history of afib on Eliquis, h/o breast cancer s/p lumpectomy and XRT, renal HTN, and dementia, who was admitted on 11/08/2017 with AMS of unclear etiology. Workup is ongoing. Palliative care was consulted to help establish medical goals.   Clinical Assessment and Goals of Care: Patient is lethargic and confused. Case discussed with attending. Workup is ongoing.   It is unclear to me what is patient's cognitive and functional baseline. I tried calling her ALF facility, [facility] but could not reach staff that were familiar with her.   I tried calling both of patient's daughters but did not reach them.   SUMMARY OF RECOMMENDATIONS   1. Will continue to try to reach family to clarify goals.  2. Continue supportive care      Primary Diagnoses: Present on Admission: . AF (atrial fibrillation) ([place]) . Essential hypertension . Elevated temperature . Leukocytosis . Metabolic encephalopathy   I have reviewed the medical record, interviewed the patient and family, and examined the patient. The following aspects are pertinent.  Past Medical History:  Diagnosis Date  . Anxiety   . Atrial fibrillation ([facility])    On chronic Eliquis  . Breast cancer ([place]) dx'd 1997   left; lt lumpectomy and XRT  . Mixed hyperlipidemia   . Palpitations   . Pre-diabetes   . Renal hypertension 03/07/2005   no evidence of significant diameter reduction, dissection, tortuosity, FMD or orther vascular abnormality; kidneys equal in size, symmetry, with normal cortex and medulla   Social History   Socioeconomic History  .  Marital status: Widowed    Spouse name: Not on file  . Number of children: 3  . Years of education: Not on file  . Highest education level: Not on file  Occupational History  . Occupation: Retired Tour manager  . Financial resource strain: Not on file  . Food insecurity:    Worry: Not on file    Inability: Not on file  . Transportation needs:    Medical: Not on file    Non-medical: Not on file  Tobacco Use  . Smoking status: Former Smoker    Packs/day: 0.75    Years: 10.00    Pack years: 7.50    Types: Cigarettes    Last attempt to quit: 04/02/1988    Years since quitting: 29.6  . Smokeless tobacco: Never Used  Substance and Sexual Activity  . Alcohol use: Yes    Alcohol/week: 0.0 standard drinks    Comment: 1/4 glass of wine every 2 weeks  . Drug use: No  . Sexual activity: Not on file  Lifestyle  . Physical activity:    Days per week: Not on file    Minutes per session: Not  on file  . Stress: Not on file  Relationships  . Social connections:    Talks on phone: Not on file    Gets together: Not on file    Attends religious service: Not on file    Active member of club or organization: Not on file    Attends meetings of clubs or organizations: Not on file    Relationship status: Not on file  Other Topics Concern  . Not on file  Social History Narrative   Widowed.  Lives alone.  Does not have any home health services (hasn't wanted anyone in the house).  Ambulates with a cane or walker.   Family History  Problem Relation Age of Onset  . Heart failure Mother   . Breast cancer Mother   . Hypertension Father   . Kidney cancer Father   . Stroke Maternal Grandmother    Scheduled Meds: . apixaban  2.5 mg Oral BID  . atenolol  25 mg Oral QHS  . calcium carbonate  1,250 mg Oral Daily  . fluticasone  1 spray Each Nare QHS  . furosemide  20 mg Intravenous Q12H  . HYDROcodone-acetaminophen  0.5 tablet Oral BID  . loratadine  10 mg Oral Daily  . multivitamins  with iron  1 tablet Oral Daily  . psyllium  1 packet Oral Daily  . senna  1 tablet Oral Once per day on Mon Wed Fri  . sertraline  25 mg Oral QHS  . spironolactone  12.5 mg Oral Daily  . verapamil  240 mg Oral QHS   Continuous Infusions: PRN Meds:.acetaminophen, albuterol, ketorolac, ondansetron, polyethylene glycol Medications Prior to Admission:  Prior to Admission medications   Medication Sig Start Date End Date Taking? Authorizing Provider  acetaminophen (TYLENOL) 325 MG tablet Take 650 mg by mouth 2 (two) times daily as needed for mild pain.   Yes [provider]  albuterol (PROVENTIL HFA;VENTOLIN HFA) 108 (90 Base) MCG/ACT inhaler Inhale 1 puff into the lungs every 4 (four) hours as needed for wheezing or shortness of breath. 04/03/15  Yes Nita Sells, MD  apixaban (ELIQUIS) 2.5 MG TABS tablet Take 1 tablet (2.5 mg total) by mouth 2 (two) times daily. 12/23/13  Yes Troy Sine, MD  atenolol (TENORMIN) 25 MG tablet Take 1 tablet (25 mg total) by mouth at bedtime. 07/01/14  Yes Robbie Lis, MD  calcium carbonate (OS-CAL) 600 MG TABS Take 600 mg by mouth daily.    Yes [provider]  cephALEXin (KEFLEX) 500 MG capsule Take 2 capsules (1,000 mg total) by mouth 2 (two) times daily. 11/02/17  Yes Charlesetta Shanks, MD  fexofenadine (ALLEGRA) 180 MG tablet Take 180 mg by mouth daily.   Yes [provider]  fluticasone (FLONASE) 50 MCG/ACT nasal spray Place 1 spray into both nostrils at bedtime.  07/04/12  Yes [provider]  furosemide (LASIX) 20 MG tablet Take 1 tablet (20 mg total) by mouth daily. 08/15/14  Yes Robbie Lis, MD  HYDROcodone-acetaminophen (NORCO/VICODIN) 5-325 MG tablet Take 0.5 tablets by mouth 2 (two) times daily.    Yes [provider]  Multiple Vitamins-Iron (MULTIVITAMINS WITH IRON) TABS tablet Take 1 tablet by mouth daily.   Yes [provider]  ondansetron (ZOFRAN) 4 MG tablet Take 1 tablet (4 mg total) by  mouth every 6 (six) hours as needed for nausea. 08/15/14  Yes Robbie Lis, MD  Psyllium 500 MG CAPS Take 500 mg by mouth daily.  Yes [provider]  senna (SENOKOT) 8.6 MG TABS tablet Take 1 tablet by mouth 3 (three) times a week. Monday, Wednesday and Friday   Yes [provider]  sertraline (ZOLOFT) 25 MG tablet Take 25 mg by mouth at bedtime. 10/14/17  Yes [provider]  Spacer/Aero-Holding Chambers (AEROCHAMBER PLUS FLO-VU MEDIUM) MISC 1 each by Other route once. 04/03/15  Yes Nita Sells, MD  spironolactone (ALDACTONE) 25 MG tablet Take 12.5 mg by mouth daily.  02/17/15  Yes [provider]  verapamil (VERELAN PM) 240 MG 24 hr capsule Take 1 capsule (240 mg total) by mouth at bedtime. 05/03/14  Yes Troy Sine, MD  metoCLOPramide (REGLAN) 10 MG tablet Take 1 tablet (10 mg total) by mouth every 8 (eight) hours as needed for nausea. Patient not taking: Reported on 11/08/2017 03/21/15   Harvel Quale, MD  rosuvastatin (CRESTOR) 10 MG tablet Take 1 tablet (10 mg total) by mouth every other day. Patient not taking: Reported on 11/08/2017 05/10/14   Troy Sine, MD  traMADol (ULTRAM) 50 MG tablet Take 1 tablet (50 mg total) by mouth every 6 (six) hours as needed. Patient not taking: Reported on 11/08/2017 08/25/16   Milton Ferguson, MD   Allergies  Allergen Reactions  . Influenza Vaccines Other (See Comments)    Pt's arm swelled and dr told her not to take vaccine again.  Preston Fleeting [Trandolapril-Verapamil Hcl Er] Other (See Comments)    Increase blood pressure  . Adhesive [Tape] Rash    Adhesive bandaids   . Azithromycin Rash  . Lipitor [Atorvastatin] Rash  . Penicillins Rash   Review of Systems  Unable to perform ROS All other systems reviewed and are negative.   Physical Exam  Constitutional:  Ill appearing  Cardiovascular:  irregular  Pulmonary/Chest: Effort normal and breath sounds normal.  Abdominal: Soft. Bowel sounds are normal.   Neurological:  Lethargic, moans in response to stimulation   Skin: Skin is warm and dry.  Nursing note and vitals reviewed.   Vital Signs: BP (!) 160/90 (BP Location: Left Arm)   Pulse (!) 117   Temp 98 F (36.7 C) (Oral)   Resp (!) 22   Ht 5\' 8"  (1.727 m)   Wt 77.9 kg   SpO2 91%   BMI 26.11 kg/m  Pain Scale: Faces   Pain Score: Asleep   SpO2: SpO2: 91 % O2 Device:SpO2: 91 % O2 Flow Rate: .   IO: Intake/output summary:   Intake/Output Summary (Last 24 hours) at 11/09/2017 1523 Last data filed at 11/09/2017 0900 Gross per 24 hour  Intake 0 ml  Output -  Net 0 ml    LBM: Last BM Date: 11/08/17 Baseline Weight: Weight: 79.4 kg Most recent weight: Weight: 77.9 kg     Palliative Assessment/Data:   Flowsheet Rows     Most Recent Value  Intake Tab  Referral Department  Hospitalist  Palliative Care Primary Diagnosis  Neurology  Date Notified  11/08/17  Palliative Care Type  New Palliative care  Reason for referral  Clarify Goals of Care  Date of Admission  11/08/17  Date first seen by Palliative Care  11/09/17  # of days Palliative referral response time  1 Day(s)  # of days IP prior to Palliative referral  0  Clinical Assessment  Palliative Performance Scale Score  30%  Pain Max last 24 hours  Not able to report  Pain Min Last 24 hours  Not able to report  Dyspnea Max Last 24 Hours  Not able to report  Dyspnea Min Last 24 hours  Not able to report  Nausea Max Last 24 Hours  Not able to report  Nausea Min Last 24 Hours  Not able to report  Anxiety Max Last 24 Hours  Not able to report  Anxiety Min Last 24 Hours  Not able to report  Other Max Last 24 Hours  Not able to report  Psychosocial & Spiritual Assessment  Palliative Care Outcomes      Time In: 1500 Time Out: 1530 Time Total: 30 minutes Greater than 50%  of this time was spent counseling and coordinating care related to the above assessment and plan.  Signed by: Irean Hong, NP   Please  contact Palliative Medicine Team phone at [phone] for questions and concerns.  For individual provider: See Shea Evans

## 2017-11-09 NOTE — Progress Notes (Signed)
Pt given applesauce and grape juice and water, tolerated will, minimal coughing, able to swallow fairly well.  She knows her name, birthdate and that she is in the hospital.

## 2017-11-09 NOTE — Progress Notes (Signed)
Pt refusing to eat or drink anything/or take any pills.

## 2017-11-09 NOTE — Discharge Instructions (Signed)

## 2017-11-10 DIAGNOSIS — B952 Enterococcus as the cause of diseases classified elsewhere: Secondary | ICD-10-CM

## 2017-11-10 DIAGNOSIS — N179 Acute kidney failure, unspecified: Secondary | ICD-10-CM | POA: Diagnosis not present

## 2017-11-10 DIAGNOSIS — R7881 Bacteremia: Secondary | ICD-10-CM

## 2017-11-10 DIAGNOSIS — K921 Melena: Secondary | ICD-10-CM | POA: Diagnosis not present

## 2017-11-10 DIAGNOSIS — Z515 Encounter for palliative care: Secondary | ICD-10-CM | POA: Diagnosis not present

## 2017-11-10 DIAGNOSIS — G9341 Metabolic encephalopathy: Secondary | ICD-10-CM | POA: Diagnosis not present

## 2017-11-10 LAB — CBC WITH DIFFERENTIAL/PLATELET
ABS IMMATURE GRANULOCYTES: 0.1 10*3/uL (ref 0.0–0.1)
BASOS PCT: 1 %
Basophils Absolute: 0.1 10*3/uL (ref 0.0–0.1)
EOS ABS: 0.2 10*3/uL (ref 0.0–0.7)
Eosinophils Relative: 1 %
HCT: 33.5 % — ABNORMAL LOW (ref 36.0–46.0)
Hemoglobin: 11 g/dL — ABNORMAL LOW (ref 12.0–15.0)
Immature Granulocytes: 1 %
Lymphocytes Relative: 9 %
Lymphs Abs: 1 10*3/uL (ref 0.7–4.0)
MCH: 30 pg (ref 26.0–34.0)
MCHC: 32.8 g/dL (ref 30.0–36.0)
MCV: 91.3 fL (ref 78.0–100.0)
MONO ABS: 1 10*3/uL (ref 0.1–1.0)
MONOS PCT: 9 %
Neutro Abs: 9.9 10*3/uL — ABNORMAL HIGH (ref 1.7–7.7)
Neutrophils Relative %: 79 %
Platelets: 194 10*3/uL (ref 150–400)
RBC: 3.67 MIL/uL — ABNORMAL LOW (ref 3.87–5.11)
RDW: 16 % — AB (ref 11.5–15.5)
WBC: 12.3 10*3/uL — ABNORMAL HIGH (ref 4.0–10.5)

## 2017-11-10 LAB — BLOOD CULTURE ID PANEL (REFLEXED)
Acinetobacter baumannii: NOT DETECTED
CANDIDA GLABRATA: NOT DETECTED
CANDIDA KRUSEI: NOT DETECTED
CANDIDA PARAPSILOSIS: NOT DETECTED
CANDIDA TROPICALIS: NOT DETECTED
Candida albicans: NOT DETECTED
ESCHERICHIA COLI: NOT DETECTED
Enterobacter cloacae complex: NOT DETECTED
Enterobacteriaceae species: NOT DETECTED
Enterococcus species: DETECTED — AB
Haemophilus influenzae: NOT DETECTED
KLEBSIELLA OXYTOCA: NOT DETECTED
KLEBSIELLA PNEUMONIAE: NOT DETECTED
LISTERIA MONOCYTOGENES: NOT DETECTED
Neisseria meningitidis: NOT DETECTED
PROTEUS SPECIES: NOT DETECTED
Pseudomonas aeruginosa: NOT DETECTED
SERRATIA MARCESCENS: NOT DETECTED
STAPHYLOCOCCUS SPECIES: NOT DETECTED
Staphylococcus aureus (BCID): NOT DETECTED
Streptococcus agalactiae: NOT DETECTED
Streptococcus pneumoniae: NOT DETECTED
Streptococcus pyogenes: NOT DETECTED
Streptococcus species: NOT DETECTED
Vancomycin resistance: NOT DETECTED

## 2017-11-10 LAB — BASIC METABOLIC PANEL
Anion gap: 11 (ref 5–15)
BUN: 52 mg/dL — AB (ref 8–23)
CO2: 23 mmol/L (ref 22–32)
Calcium: 9.4 mg/dL (ref 8.9–10.3)
Chloride: 100 mmol/L (ref 98–111)
Creatinine, Ser: 1.43 mg/dL — ABNORMAL HIGH (ref 0.44–1.00)
GFR calc Af Amer: 37 mL/min — ABNORMAL LOW (ref >60)
GFR calc non Af Amer: 32 mL/min — ABNORMAL LOW (ref >60)
GLUCOSE: 97 mg/dL (ref 70–99)
POTASSIUM: 4.2 mmol/L (ref 3.5–5.1)
Sodium: 134 mmol/L — ABNORMAL LOW (ref 135–145)

## 2017-11-10 LAB — URINE CULTURE: CULTURE: NO GROWTH

## 2017-11-10 LAB — RPR: RPR: NONREACTIVE

## 2017-11-10 LAB — HIV ANTIBODY (ROUTINE TESTING W REFLEX): HIV Screen 4th Generation wRfx: NONREACTIVE

## 2017-11-10 LAB — BRAIN NATRIURETIC PEPTIDE: B Natriuretic Peptide: 1053.7 pg/mL — ABNORMAL HIGH (ref 0.0–100.0)

## 2017-11-10 MED ORDER — VANCOMYCIN HCL IN DEXTROSE 1-5 GM/200ML-% IV SOLN
1000.0000 mg | INTRAVENOUS | Status: DC
  Administered 2017-11-10 – 2017-11-11 (×2): 1000 mg via INTRAVENOUS
  Filled 2017-11-10 (×2): qty 200

## 2017-11-10 NOTE — Plan of Care (Signed)
  Problem: Clinical Measurements: Goal: Will remain free from infection Outcome: Progressing   Problem: Activity: Goal: Risk for activity intolerance will decrease Outcome: Progressing   Problem: Nutrition: Goal: Adequate nutrition will be maintained Outcome: Progressing   Problem: Pain Managment: Goal: General experience of comfort will improve Outcome: Progressing   

## 2017-11-10 NOTE — Progress Notes (Signed)
Pharmacy Antibiotic Note  Alexandra Luna is a 84 y.o. female admitted on 11/08/2017 with AMS.  Pharmacy has been consulted for vancomycin dosing for bacteremia. Pt is afebrile and WBC is elevated at 12.3 but trending down. Scr is elevated. ID consulted.   Plan: Vancomycin 1gm IV Q24H F/u renal fxn, C&S, clinical status and trough at SS  Height: 5\' 8"  (172.7 cm) Weight: 175 lb 4.3 oz (79.5 kg) IBW/kg (Calculated) : 63.9  Temp (24hrs), Avg:98.2 F (36.8 C), Min:98.1 F (36.7 C), Max:98.2 F (36.8 C)  Recent Labs  Lab 11/08/17 0957 11/09/17 0503 11/10/17 0532  WBC 14.7* 13.9* 12.3*  CREATININE 1.01* 1.50* 1.43*    Estimated Creatinine Clearance: 30.1 mL/min (A) (by C-G formula based on SCr of 1.43 mg/dL (H)).    Allergies  Allergen Reactions  . Influenza Vaccines Other (See Comments)    Pt's arm swelled and dr told her not to take vaccine again.  Preston Fleeting [Trandolapril-Verapamil Hcl Er] Other (See Comments)    Increase blood pressure  . Adhesive [Tape] Rash    Adhesive bandaids   . Azithromycin Rash  . Lipitor [Atorvastatin] Rash  . Penicillins Rash    Antimicrobials this admission: Vanc 8/11>>  Dose adjustments this admission: N/A  Microbiology results: 8/10 Blood - GPC (BCID with enterococcus) 8/9 MRSA - POS  Thank you for allowing pharmacy to be a part of this patient's care.  Aneudy Champlain, Rande Lawman 11/10/2017 1:07 PM

## 2017-11-10 NOTE — Progress Notes (Addendum)
PROGRESS NOTE    Alexandra Luna  NOI:[id number] DOB: June 10, 1926 DOA: 11/08/2017 PCP: Reymundo Poll, MD    Brief Narrative Patient is a 84 year old female history of hemorrhoids, anxiety, A. fib on chronic anticoagulation with Eliquis, history of remote breast cancer, renovascular hypertension, diabetes, dementia presents to the ED from skilled nursing facility secondary to worsening confusion.  Patient recently treated for cellulitis of the lower extremity.   Assessment & Plan:   Principal Problem:   Acute metabolic encephalopathy Active Problems:   Bacteremia due to Gram-positive bacteria   History of breast cancer in female   AF (atrial fibrillation) (HCC)   Essential hypertension   Chronic anticoagulation   Generalized weakness   Blood in stool   Elevated temperature   Leukocytosis   Metabolic encephalopathy   Palliative care encounter  #1 acute metabolic encephalopathy Likely secondary to bacteremia in the setting of dehydration.  Patient presented from nursing facility with worsening confusion after recently being treated for cellulitis.  Patient with a baseline dementia and baseline unknown.  Patient more alert today.  Patient following commands.  Patient supposedly had received a dose of Ativan the night of 11/08/2017.  Chest x-ray negative.  Urinalysis with nitrite negative leukocytes negative.  RPR pending.  Vitamin B12 levels at 792.  RBC folate pending.  TSH at 3.236.  CT head negative for any acute abnormalities.  Blood cultures positive for gram-positive bacteremia.  Place patient empirically on IV vancomycin.  Continue gentle hydration.  Follow.  2.  Gram-positive bacteremia Noted on blood cultures x2.  BCID positive for enterococcus species.  Check a 2D echo.  Place on IV vancomycin.  ID consultation pending.  3.??  Rectal bleeding Per admitting physician patient with a long history of blood in his stool.  Patient is DNR.  H&H currently stable.  No overt bleeding noted  overnight.  Patient also with a history of hemorrhoids.  Patient on anticoagulation with Eliquis.  Will follow for now.  4.  Leukocytosis Leukocytosis likely secondary to bacteremia noted on blood cultures which were drawn.  Chest x-ray negative.  Urinalysis nitrite negative leukocytes negative.  Place on IV vancomycin for now.   5.  Acute kidney injury Patient noted to have a creatinine of 1.50 from 1.01 on admission.  Patient looks clinically dry on examination.  Likely secondary to a prerenal azotemia in the setting of diuretics.  Discontinued diuretics.  Urinalysis with 30 protein nitrite negative leukocytes negative.  Urine sodium of 19.  Renal function trending down with gentle hydration.  Follow.   6.  Atrial fibrillation Currently rate controlled on atenolol.  Continue Eliquis for anticoagulation.  H&H stable.  If significant drop in hemoglobin with no overt bleeding will discontinue Eliquis.  7.  Generalized weakness Questionable etiology.  PT/OT.    DVT prophylaxis: Eliquis Code Status: DNR Family Communication: No family at bedside. Disposition Plan: To be determined.  Likely back to skilled nursing facility once medically stable and back at baseline.   Consultants:   Palliative care: Altha Harm, NP 11/09/2017  ID pending  Procedures:   Chest x-ray 11/09/2017, 11/08/2017  Renal ultrasound 11/09/2017  Antimicrobials:   IV vancomycin 11/10/2017   Subjective: Patient alert.  Patient with some confusion however significant improvement since admission.  No chest pain no no shortness of breath.  No overt bleeding.    Objective: Vitals:   11/09/17 2151 11/10/17 0429 11/10/17 0500 11/10/17 1428  BP: (!) 122/54 106/64  110/63  Pulse:  77  68  Resp:  20  17  Temp:  98.2 F (36.8 C)  97.6 F (36.4 C)  TempSrc:  Oral  Oral  SpO2:  98%  97%  Weight:   79.5 kg   Height:        Intake/Output Summary (Last 24 hours) at 11/10/2017 1841 Last data filed at 11/10/2017  1702 Gross per 24 hour  Intake 1365.47 ml  Output 776 ml  Net 589.47 ml   Filed Weights   11/08/17 0855 11/09/17 1434 11/10/17 0500  Weight: 79.4 kg 77.9 kg 79.5 kg    Examination:  General exam: Alert. Respiratory system: Lungs clear to auscultation bilaterally anterior lung fields.  No wheezes, no crackles, no rhonchi.  Respiratory effort normal. Cardiovascular system: Regular rate and rhythm no murmurs rubs or gallops.  No JVD.  No lower extremity edema.  Gastrointestinal system: Abdomen is soft, nontender, nondistended, positive bowel sounds.  Central nervous system: Alert.  Moving extremities spontaneously.  Extremities: Symmetric 5 x 5 power. Skin: No rashes, lesions or ulcers Psychiatry: Judgement and insight appear poor-fair. Mood & affect appropriate.     Data Reviewed: I have personally reviewed following labs and imaging studies  CBC: Recent Labs  Lab 11/08/17 0957 11/09/17 0503 11/10/17 0532  WBC 14.7* 13.9* 12.3*  NEUTROABS 12.8*  --  9.9*  HGB 12.7 11.7* 11.0*  HCT 39.7 36.0 33.5*  MCV 93.2 93.3 91.3  PLT 221 182 270   Basic Metabolic Panel: Recent Labs  Lab 11/08/17 0957 11/09/17 0503 11/10/17 0532  NA 133* 133* 134*  K 4.3 4.8 4.2  CL 97* 99 100  CO2 22 21* 23  GLUCOSE 115* 106* 97  BUN 26* 37* 52*  CREATININE 1.01* 1.50* 1.43*  CALCIUM 10.1 9.6 9.4   GFR: Estimated Creatinine Clearance: 30.1 mL/min (A) (by C-G formula based on SCr of 1.43 mg/dL (H)). Liver Function Tests: Recent Labs  Lab 11/08/17 0957  AST 32  ALT 24  ALKPHOS 89  BILITOT 1.6*  PROT 6.7  ALBUMIN 3.3*   No results for input(s): LIPASE, AMYLASE in the last 168 hours. No results for input(s): AMMONIA in the last 168 hours. Coagulation Profile: No results for input(s): INR, PROTIME in the last 168 hours. Cardiac Enzymes: Recent Labs  Lab 11/08/17 0957 11/08/17 1457 11/08/17 2046 11/09/17 0503  TROPONINI 0.04* 0.05* 0.04* 0.05*   BNP (last 3 results) No  results for input(s): PROBNP in the last 8760 hours. HbA1C: No results for input(s): HGBA1C in the last 72 hours. CBG: No results for input(s): GLUCAP in the last 168 hours. Lipid Profile: No results for input(s): CHOL, HDL, LDLCALC, TRIG, CHOLHDL, LDLDIRECT in the last 72 hours. Thyroid Function Tests: Recent Labs    11/09/17 1524  TSH 3.236   Anemia Panel: Recent Labs    11/09/17 1524  VITAMINB12 792   Sepsis Labs: No results for input(s): PROCALCITON, LATICACIDVEN in the last 168 hours.  Recent Results (from the past 240 hour(s))  Urine culture     Status: None   Collection Time: 11/02/17  4:02 PM  Result Value Ref Range Status   Specimen Description URINE, CATHETERIZED  Final   Special Requests NONE  Final   Culture   Final    NO GROWTH Performed at [facility], [address] [place], [place] [address]    Report Status 11/03/2017 FINAL  Final  MRSA PCR Screening     Status: Abnormal   Collection Time: 11/08/17  3:54 PM  Result  Value Ref Range Status   MRSA by PCR POSITIVE (A) NEGATIVE Final    Comment:        The GeneXpert MRSA Assay (FDA approved for NASAL specimens only), is one component of a comprehensive MRSA colonization surveillance program. It is not intended to diagnose MRSA infection nor to guide or monitor treatment for MRSA infections. RESULT CALLED TO, READ BACK BY AND VERIFIED WITH: Marjory Sneddon RN 11/08/17 1838 JDW Performed at [facility], [place] [address], [place], [place] [address]   Urine Culture     Status: None   Collection Time: 11/09/17  4:35 PM  Result Value Ref Range Status   Specimen Description URINE, CATHETERIZED  Final   Special Requests NONE  Final   Culture   Final    NO GROWTH Performed at [facility], [address], [place], [place] [address]    Report Status 11/10/2017 FINAL  Final  Culture, blood (Routine X 2) w Reflex to ID Panel     Status: None (Preliminary result)   Collection Time: 11/09/17  6:42  PM  Result Value Ref Range Status   Specimen Description BLOOD LEFT ANTECUBITAL  Final   Special Requests   Final    BOTTLES DRAWN AEROBIC ONLY Blood Culture adequate volume   Culture  Setup Time   Final    GRAM POSITIVE COCCI IN PAIRS AND CHAINS AEROBIC BOTTLE ONLY CRITICAL RESULT CALLED TO, READ BACK BY AND VERIFIED WITH: R. RUMBARGER, RPHARMD AT 1250 ON 11/10/17 BY C. JESSUP, MLT. Performed at [facility], [place] [address], [place], [place] [address]    Culture GRAM POSITIVE COCCI  Final   Report Status PENDING  Incomplete  Blood Culture ID Panel (Reflexed)     Status: Abnormal   Collection Time: 11/09/17  6:42 PM  Result Value Ref Range Status   Enterococcus species DETECTED (A) NOT DETECTED Final    Comment: CRITICAL RESULT CALLED TO, READ BACK BY AND VERIFIED WITH: R. RUMBARGER, RPHARMD AT 1250 ON 11/10/17 BY C. JESSUP, MLT.    Vancomycin resistance NOT DETECTED NOT DETECTED Final   Listeria monocytogenes NOT DETECTED NOT DETECTED Final   Staphylococcus species NOT DETECTED NOT DETECTED Final   Staphylococcus aureus NOT DETECTED NOT DETECTED Final   Streptococcus species NOT DETECTED NOT DETECTED Final   Streptococcus agalactiae NOT DETECTED NOT DETECTED Final   Streptococcus pneumoniae NOT DETECTED NOT DETECTED Final   Streptococcus pyogenes NOT DETECTED NOT DETECTED Final   Acinetobacter baumannii NOT DETECTED NOT DETECTED Final   Enterobacteriaceae species NOT DETECTED NOT DETECTED Final   Enterobacter cloacae complex NOT DETECTED NOT DETECTED Final   Escherichia coli NOT DETECTED NOT DETECTED Final   Klebsiella oxytoca NOT DETECTED NOT DETECTED Final   Klebsiella pneumoniae NOT DETECTED NOT DETECTED Final   Proteus species NOT DETECTED NOT DETECTED Final   Serratia marcescens NOT DETECTED NOT DETECTED Final   Haemophilus influenzae NOT DETECTED NOT DETECTED Final   Neisseria meningitidis NOT DETECTED NOT DETECTED Final   Pseudomonas aeruginosa NOT DETECTED NOT  DETECTED Final   Candida albicans NOT DETECTED NOT DETECTED Final   Candida glabrata NOT DETECTED NOT DETECTED Final   Candida krusei NOT DETECTED NOT DETECTED Final   Candida parapsilosis NOT DETECTED NOT DETECTED Final   Candida tropicalis NOT DETECTED NOT DETECTED Final    Comment: Performed at [facility], [place] [address], [place], [place] [address]  Culture, blood (Routine X 2) w Reflex to ID  Panel     Status: None (Preliminary result)   Collection Time: 11/09/17  6:50 PM  Result Value Ref Range Status   Specimen Description BLOOD RIGHT HAND  Final   Special Requests   Final    BOTTLES DRAWN AEROBIC AND ANAEROBIC Blood Culture results may not be optimal due to an inadequate volume of blood received in culture bottles   Culture  Setup Time   Final    GRAM POSITIVE COCCI AEROBIC BOTTLE ONLY CRITICAL RESULT CALLED TO, READ BACK BY AND VERIFIED WITH: R. RUMBARGER, RPHARMD AT 1250 ON 11/10/17 BY C. JESSUP, MLT. Performed at [facility], [place] [address], [place], [place] [address]    Culture GRAM POSITIVE COCCI  Final   Report Status PENDING  Incomplete         Radiology Studies: Dg Chest 2 View  Result Date: 11/09/2017 CLINICAL DATA:  Hypoxia. EXAM: CHEST - 2 VIEW COMPARISON:  November 08, 2017 FINDINGS: Cardiomegaly. No pneumothorax. No pulmonary nodules, masses, or focal infiltrates. IMPRESSION: No active cardiopulmonary disease. Electronically Signed   By: Dorise Bullion III M.D   On: 11/09/2017 10:07   Ct Head Wo Contrast  Result Date: 11/09/2017 CLINICAL DATA:  Encephalopathy. EXAM: CT HEAD WITHOUT CONTRAST TECHNIQUE: Contiguous axial images were obtained from the base of the skull through the vertex without intravenous contrast. COMPARISON:  CT head without contrast 05/28/2017. FINDINGS: Brain: Mild atrophy and white matter changes bilaterally are stable. A remote lacunar infarct in the posterior limb of the right internal capsule is stable. A remote lacunar infarct  involving the inferior right cerebellum is stable. No acute infarct, hemorrhage, or mass lesion is present. The ventricles are proportionate to the degree of atrophy. No significant extra-axial fluid collection is present. Vascular: Atherosclerotic calcifications are present within the cavernous internal carotid arteries bilaterally. There is no hyperdense vessel. Skull: The skull base is within normal limits. The craniocervical junction is normal. Hyperostosis front turn is talus is again noted. Sinuses/Orbits: The paranasal sinuses and mastoid air cells are clear. Bilateral lens replacements are present. Globes and orbits are within normal limits otherwise. IMPRESSION: 1. Acute intracranial abnormality or significant interval change. 2. Stable atrophy and white matter disease. This likely reflects the sequela of chronic microvascular ischemia. Electronically Signed   By: San Morelle M.D.   On: 11/09/2017 20:47   US Renal  Result Date: 11/09/2017 CLINICAL DATA:  Acute renal failure. EXAM: RENAL / URINARY TRACT ULTRASOUND COMPLETE COMPARISON:  CT scan March 20, 2015 FINDINGS: Right Kidney: Length: 10.9 cm. Echogenicity within normal limits. No mass or hydronephrosis visualized. Left Kidney: Length: 11.4 cm. Mildly limited evaluation due to body habitus. No abnormalities seen. Bladder: Appears normal for degree of bladder distention. IMPRESSION: Mildly limit evaluation of the left kidney due to body habitus. No abnormalities identified to explain the patient's acute renal failure. Electronically Signed   By: Dorise Bullion III M.D   On: 11/09/2017 10:07        Scheduled Meds: . apixaban  2.5 mg Oral BID  . atenolol  25 mg Oral QHS  . calcium carbonate  1,250 mg Oral Daily  . fluticasone  1 spray Each Nare QHS  . HYDROcodone-acetaminophen  0.5 tablet Oral BID  . loratadine  10 mg Oral Daily  . multivitamins with iron  1 tablet Oral Daily  . psyllium  1 packet Oral Daily  . senna  1  tablet Oral Once per day on Mon Wed Fri  . sertraline  25 mg Oral QHS  . verapamil  240 mg Oral QHS   Continuous Infusions: . sodium chloride 75 mL/hr at 11/10/17 0618  . vancomycin 1,000 mg (11/10/17 1411)     LOS: 0 days    Time spent: 35 minutes    Irine Seal, MD [facility] Pager [phone]  If 7PM-7AM, please contact night-coverage [email or web address] Password [facility] 11/10/2017, 6:41 PM

## 2017-11-10 NOTE — Evaluation (Signed)
Clinical/Bedside Swallow Evaluation Patient Details  Name: NIKEISHA KLUTZ MRN: [id number] Date of Birth: [birth year]  Today's Date: 11/10/2017 Time: SLP Start Time (ACUTE ONLY): 1140 SLP Stop Time (ACUTE ONLY): 1155 SLP Time Calculation (min) (ACUTE ONLY): 15 min  Past Medical History:  Past Medical History:  Diagnosis Date  . Anxiety   . Atrial fibrillation (HCC)    On chronic Eliquis  . Breast cancer ([place]) dx'd 1997   left; lt lumpectomy and XRT  . Mixed hyperlipidemia   . Palpitations   . Pre-diabetes   . Renal hypertension 03/07/2005   no evidence of significant diameter reduction, dissection, tortuosity, FMD or orther vascular abnormality; kidneys equal in size, symmetry, with normal cortex and medulla   Past Surgical History:  Past Surgical History:  Procedure Laterality Date  . BREAST LUMPECTOMY    . BREAST LUMPECTOMY  1998   left side  . CARDIOVASCULAR STRESS TEST  526/2011   R/Dipyridamole - EF 71%; normal myocaridal perfusion w/o evidence of ischemia or infarct; no schintigraphic evidence of inducible myocaridal ischemia; global LV function normal; EKG negative for ischemia; low risk scan   . CARDIOVERSION N/A 11/12/2012   Procedure: CARDIOVERSION;  Surgeon: Troy Sine, MD;  Location: [facility];  Service: Cardiovascular;  Laterality: N/A;  . CESAREAN SECTION     x 3  . DOPPLER ECHOCARDIOGRAPHY  08/29/2011   EF >56%;  LV systolic fcn normal; doppler flow pattern suggestive of impaired LV relaxation; LA mildly dilated; aortic valve mildly sclerotic, no aortic stenosis  . tonsillectomy     HPI:  Patient is a 84 year old female history of hemorrhoids, anxiety, A. fib on chronic anticoagulation with Eliquis, history of remote breast cancer, renovascular hypertension, diabetes, dementia presents to the ED from skilled nursing facility secondary to worsening confusion.  Patient recently treated for cellulitis of the lower extremity.  Was seen by SLP in 2016 found to have  ideational apraxia, but no difficulty swallowing.    Assessment / Plan / Recommendation Clinical Impression  Pt demonstrates adequate tolerance of thin and solids after dentures in place. Recommend pt continue current diet. No SLP f/u sign off.       Aspiration Risk  Mild aspiration risk    Diet Recommendation Regular;Thin liquid   Liquid Administration via: Straw;Cup    Other  Recommendations     Follow up Recommendations        Frequency and Duration            Prognosis        Swallow Study   General HPI: Patient is a 84 year old female history of hemorrhoids, anxiety, A. fib on chronic anticoagulation with Eliquis, history of remote breast cancer, renovascular hypertension, diabetes, dementia presents to the ED from skilled nursing facility secondary to worsening confusion.  Patient recently treated for cellulitis of the lower extremity.  Was seen by SLP in 2016 found to have ideational apraxia, but no difficulty swallowing.  Type of Study: Bedside Swallow Evaluation Diet Prior to this Study: Regular;Thin liquids Temperature Spikes Noted: No Respiratory Status: Room air History of Recent Intubation: No Behavior/Cognition: Alert;Cooperative;Pleasant mood;Confused Oral Cavity Assessment: Within Functional Limits Oral Care Completed by SLP: No Oral Cavity - Dentition: Dentures, top;Dentures, bottom Vision: Functional for self-feeding Self-Feeding Abilities: Able to feed self Patient Positioning: Upright in bed Baseline Vocal Quality: Normal Volitional Cough: Strong Volitional Swallow: Able to elicit    Oral/Motor/Sensory Function Overall Oral Motor/Sensory Function: Within functional limits   Ice Chips Ice chips: Not tested  Thin Liquid Thin Liquid: Within functional limits Presentation: Cup;Straw;Self Fed    Nectar Thick Nectar Thick Liquid: Not tested   Honey Thick Honey Thick Liquid: Not tested   Puree Puree: Within functional limits   Solid     Solid:  Within functional limits      Teresha Hanks, Katherene Ponto 11/10/2017,1:20 PM

## 2017-11-10 NOTE — Evaluation (Signed)
Physical Therapy Evaluation Patient Details Name: Alexandra Luna MRN: [id number] DOB: February 04, 1927 Today's Date: 11/10/2017   History of Present Illness  84 year old female history of hemorrhoids, anxiety, A. fib on chronic anticoagulation with Eliquis, history of remote breast cancer, renovascular hypertension, diabetes, dementia presents to the ED from [facility] secondary to worsening confusion.  Patient recently treated for cellulitis of the lower extremity.  Clinical Impression   Pt admitted with above diagnosis. Pt currently with functional limitations due to the deficits listed below (see PT Problem List). Not quite sure of baseline, but did note she uses wheelchair for general mobility, and pt told me she has assist for wheelchair transfers; Presents with functional dependencies as outlined below;  Pt will benefit from skilled PT to increase their independence and safety with mobility to allow discharge to the venue listed below.       Follow Up Recommendations Home health PT;Supervision/Assistance - 24 hour;Other (comment)(HHPT at ALF)    Equipment Recommendations  None recommended by PT    Recommendations for Other Services OT consult     Precautions / Restrictions Precautions Precautions: Fall      Mobility  Bed Mobility Overal bed mobility: Needs Assistance Bed Mobility: Sit to Supine       Sit to supine: Mod assist   General bed mobility comments: Light mod assist to help LEs into bed; once in bed, abl eto bridge hisp to center of bed with mod to max cues for technique  Transfers Overall transfer level: Needs assistance Equipment used: 2 person hand held assist(pt declined RW) Transfers: Sit to/from [facility] Sit to Stand: +2 physical assistance;Max assist Stand pivot transfers: +2 physical assistance;Max assist       General transfer comment: Extensive assist for transfers; pt declined using RW; better participation than earlier OT  session, likley because she was wanting to get back to bed; max assist and support to shift weight fo rturning to bed  Ambulation/Gait                Stairs            Wheelchair Mobility    Modified Rankin (Stroke Patients Only)       Balance Overall balance assessment: Needs assistance   Sitting balance-Leahy Scale: Fair       Standing balance-Leahy Scale: Zero                               Pertinent Vitals/Pain Pain Assessment: Faces Faces Pain Scale: Hurts whole lot Pain Location: L LE Pain Descriptors / Indicators: Discomfort;Constant Pain Intervention(s): Monitored during session;Repositioned    Home Living Family/patient expects to be discharged to:: Assisted living               Home Equipment: Gilford Rile - 4 wheels Additional Comments: Brighten Gardends    Prior Function Level of Independence: Needs assistance   Gait / Transfers Assistance Needed: per pt, she uses her wheelchair mostly; has assistance to transfer to wheelchair  ADL's / Homemaking Assistance Needed: has an aide to help         Hand Dominance   Dominant Hand: Right    Extremity/Trunk Assessment   Upper Extremity Assessment Upper Extremity Assessment: Defer to OT evaluation    Lower Extremity Assessment Lower Extremity Assessment: Generalized weakness LLE Deficits / Details: not as sensitive with LLE movement or weight bearing as in previous session with OT  Cervical / Trunk Assessment Cervical / Trunk Assessment: Kyphotic  Communication   Communication: HOH  Cognition Arousal/Alertness: Awake/alert Behavior During Therapy: Flat affect Overall Cognitive Status: No family/caregiver present to determine baseline cognitive functioning(likely at or close to baseline)                                 General Comments: pt unable to explain pain in L LE . pt only starts the Left one      General Comments General comments (skin integrity,  edema, etc.): risk for skin break down and need for repositioning and shifting weight    Exercises     Assessment/Plan    PT Assessment Patient needs continued PT services  PT Problem List Decreased strength;Decreased range of motion;Decreased activity tolerance;Decreased balance;Decreased mobility;Decreased coordination;Decreased cognition;Decreased knowledge of use of DME;Decreased safety awareness;Decreased knowledge of precautions;Pain       PT Treatment Interventions DME instruction;Gait training;Functional mobility training;Therapeutic activities;Therapeutic exercise;Balance training;Neuromuscular re-education;Cognitive remediation;Patient/family education    PT Goals (Current goals can be found in the Care Plan section)  Acute Rehab PT Goals Patient Stated Goal: get back to bed PT Goal Formulation: Patient unable to participate in goal setting Time For Goal Achievement: 11/24/17 Potential to Achieve Goals: Good    Frequency Min 3X/week   Barriers to discharge Other (comment) Can [facility] increase service/assist as needed to be able to keep Ms. Ferrall with her familiar environment, routine, and caregivers?    Co-evaluation               AM-PAC PT "6 Clicks" Daily Activity  Outcome Measure Difficulty turning over in bed (including adjusting bedclothes, sheets and blankets)?: A Lot Difficulty moving from lying on back to sitting on the side of the bed? : Unable Difficulty sitting down on and standing up from a chair with arms (e.g., wheelchair, bedside commode, etc,.)?: Unable Help needed moving to and from a bed to chair (including a wheelchair)?: A Lot Help needed walking in hospital room?: A Lot Help needed climbing 3-5 steps with a railing? : Total 6 Click Score: 9    End of Session Equipment Utilized During Treatment: Gait belt Activity Tolerance: Patient tolerated treatment well Patient left: in bed;with call bell/phone within reach;with bed alarm  set;Other (comment)(bed in semi-chair position) Nurse Communication: Mobility status PT Visit Diagnosis: Unsteadiness on feet (R26.81);Other abnormalities of gait and mobility (R26.89);Muscle weakness (generalized) (M62.81);History of falling (Z91.81)    Time: 1041-1100 PT Time Calculation (min) (ACUTE ONLY): 19 min   Charges:   PT Evaluation $PT Eval Moderate Complexity: [place], [place] Pager [phone] Office [phone]   Colletta [place] 11/10/2017, 1:13 PM

## 2017-11-10 NOTE — Consult Note (Signed)
    [facility] for Infectious Disease  Total days of antibiotics 0       Reason for Consult: enterococcal bacteremia   Referring Physician: Grandville Silos   Assessment/Plan:  84yo F admitted for cellulitis, AMS found to have enterococcal bacteremia -continue on vancomcyin- will change abtx depending on sensitivities - repeat blood cx on Tuesday - recommend to start with TTE   Dr hatcher to see tomorrow in formal consult

## 2017-11-10 NOTE — Progress Notes (Signed)
Daily Progress Note   Patient Name: Alexandra Luna       Date: 11/10/2017 DOB: 01/09/1927  Age: 84 y.o. MRN#: [id number] Attending Physician: Eugenie Filler, MD Primary Care Physician: Reymundo Poll, MD Admit Date: 11/08/2017  Reason for Consultation/Follow-up: Establishing goals of care  Subjective: Patient is more alert today. Pleasantly confused but knows her name. Also states her daughter's name.   Length of Stay: 0  Current Medications: Scheduled Meds:  . apixaban  2.5 mg Oral BID  . atenolol  25 mg Oral QHS  . calcium carbonate  1,250 mg Oral Daily  . fluticasone  1 spray Each Nare QHS  . HYDROcodone-acetaminophen  0.5 tablet Oral BID  . loratadine  10 mg Oral Daily  . multivitamins with iron  1 tablet Oral Daily  . psyllium  1 packet Oral Daily  . senna  1 tablet Oral Once per day on Mon Wed Fri  . sertraline  25 mg Oral QHS  . verapamil  240 mg Oral QHS    Continuous Infusions: . sodium chloride 75 mL/hr at 11/10/17 0618    PRN Meds: acetaminophen, albuterol, ketorolac, ondansetron, polyethylene glycol, risperiDONE  Physical Exam  Constitutional:  Frail appearing  Cardiovascular: Normal rate and regular rhythm.  Pulmonary/Chest: Effort normal and breath sounds normal.  Abdominal: Soft. Bowel sounds are normal.  Neurological: She is alert.  Pleasantly confused  Skin: Skin is warm and dry.  Nursing note and vitals reviewed.           Vital Signs: BP 106/64 (BP Location: Right Arm)   Pulse 77   Temp 98.2 F (36.8 C) (Oral)   Resp 20   Ht 5\' 8"  (1.727 m)   Wt 79.5 kg   SpO2 98%   BMI 26.65 kg/m  SpO2: SpO2: 98 % O2 Device: O2 Device: Room Air O2 Flow Rate:    Intake/output summary:   Intake/Output Summary (Last 24 hours) at 11/10/2017 1019 Last data  filed at 11/10/2017 0500 Gross per 24 hour  Intake 803.78 ml  Output 575 ml  Net 228.78 ml   LBM: Last BM Date: 11/09/17 Baseline Weight: Weight: 79.4 kg Most recent weight: Weight: 79.5 kg       Palliative Assessment/Data:    Flowsheet Rows     Most Recent Value  Intake  Tab  Referral Department  Hospitalist  Palliative Care Primary Diagnosis  Neurology  Date Notified  11/08/17  Palliative Care Type  New Palliative care  Reason for referral  Clarify Goals of Care  Date of Admission  11/08/17  Date first seen by Palliative Care  11/09/17  # of days Palliative referral response time  1 Day(s)  # of days IP prior to Palliative referral  0  Clinical Assessment  Palliative Performance Scale Score  30%  Pain Max last 24 hours  Not able to report  Pain Min Last 24 hours  Not able to report  Dyspnea Max Last 24 Hours  Not able to report  Dyspnea Min Last 24 hours  Not able to report  Nausea Max Last 24 Hours  Not able to report  Nausea Min Last 24 Hours  Not able to report  Anxiety Max Last 24 Hours  Not able to report  Anxiety Min Last 24 Hours  Not able to report  Other Max Last 24 Hours  Not able to report  Psychosocial & Spiritual Assessment  Palliative Care Outcomes      Patient Active Problem List   Diagnosis Date Noted  . Palliative care encounter   . Blood in stool 11/08/2017  . Elevated temperature 11/08/2017  . Leukocytosis 11/08/2017  . Acute metabolic encephalopathy 40/11/6759  . Metabolic encephalopathy 95/12/3265  . CHF exacerbation ([place]) 11/01/2016  . Chronic diastolic CHF (congestive heart failure) ([place]) 09/06/2016  . ARF (acute renal failure) ([place]) 09/06/2016  . Weakness generalized 09/06/2016  . COPD exacerbation ([place]) 04/01/2015  . Bilateral pleural effusion 08/30/2014  . Multiple pulmonary nodules 08/30/2014  . Acute diastolic congestive heart failure ([place]) 08/14/2014  . Acute respiratory failure with hypoxia ([place]) 08/14/2014  . Generalized  weakness 06/28/2014  . Chronic anticoagulation 12/08/2012  . AF (atrial fibrillation) ([place]) 09/22/2012  . Essential hypertension 09/22/2012  . Hyperlipidemia, mixed 09/22/2012  . History of breast cancer in female 04/28/2012    Palliative Care Assessment & Plan   Patient Profile: 85 y.o. female  with past medical history of afib on Eliquis, h/o breast cancer s/p lumpectomy and XRT, renal HTN, and dementia, who was admitted on 11/08/2017 with AMS of unclear etiology. Workup is ongoing. Palliative care was consulted to help establish medical goals.   Assessment: Patient is more alert today. I question if sedation yesterday were lingering effects from the dose of lorazepam she received the prior evening.   I called and spoke with patient's daughter. Updated her on patient's medical status. She says at baseline patient lives at [facility]. She is wheelchair bound and requires assistance with bathing, dressing, and toileting. Patient can feed herself. Daughter says that patient was diagnosed with dementia four years ago and she has seen a progressive decline since. Patient has talked about being ready to die and daughter feels that patient likely would not want aggressive workup or treatment. She would like for patient to return to ALF when patient is medically ready. I recommended that patient be followed at the ALF by community palliative care and daughter seemed to think that would be a good idea. We also discussed future involvement of hospice.   Daughter confirms DNR.   FAST likely 6E (nonambulatory). PPS 40%.   Recommendations/Plan:  Continue supportive care  Recommend palliative care follow at ALF   Thank you for allowing the Palliative Medicine Team to assist in the care of this patient.   Time In: 0830 Time Out: 0900  Total Time 30 minutes Prolonged Time Billed  NO      Greater than 50%  of this time was spent counseling and coordinating care related to the above  assessment and plan.  Irean Hong, NP  Please contact Palliative Medicine Team phone at [phone] for questions and concerns.

## 2017-11-10 NOTE — Progress Notes (Signed)
PHARMACY - PHYSICIAN COMMUNICATION CRITICAL VALUE ALERT - BLOOD CULTURE IDENTIFICATION (BCID)  Alexandra Luna is an 84 y.o. female who presented to [facility] on 11/08/2017 with a chief complaint of AMS  Name of physician (or Provider) Contacted: Grandville Silos  Current antibiotics: None  Changes to prescribed antibiotics recommended:  Recommendations accepted by provider. Will start IV vancomycin for now.   Results for orders placed or performed during the hospital encounter of 11/08/17  Blood Culture ID Panel (Reflexed) (Collected: 11/09/2017  6:42 PM)  Result Value Ref Range   Enterococcus species DETECTED (A) NOT DETECTED   Vancomycin resistance NOT DETECTED NOT DETECTED   Listeria monocytogenes NOT DETECTED NOT DETECTED   Staphylococcus species NOT DETECTED NOT DETECTED   Staphylococcus aureus NOT DETECTED NOT DETECTED   Streptococcus species NOT DETECTED NOT DETECTED   Streptococcus agalactiae NOT DETECTED NOT DETECTED   Streptococcus pneumoniae NOT DETECTED NOT DETECTED   Streptococcus pyogenes NOT DETECTED NOT DETECTED   Acinetobacter baumannii NOT DETECTED NOT DETECTED   Enterobacteriaceae species NOT DETECTED NOT DETECTED   Enterobacter cloacae complex NOT DETECTED NOT DETECTED   Escherichia coli NOT DETECTED NOT DETECTED   Klebsiella oxytoca NOT DETECTED NOT DETECTED   Klebsiella pneumoniae NOT DETECTED NOT DETECTED   Proteus species NOT DETECTED NOT DETECTED   Serratia marcescens NOT DETECTED NOT DETECTED   Haemophilus influenzae NOT DETECTED NOT DETECTED   Neisseria meningitidis NOT DETECTED NOT DETECTED   Pseudomonas aeruginosa NOT DETECTED NOT DETECTED   Candida albicans NOT DETECTED NOT DETECTED   Candida glabrata NOT DETECTED NOT DETECTED   Candida krusei NOT DETECTED NOT DETECTED   Candida parapsilosis NOT DETECTED NOT DETECTED   Candida tropicalis NOT DETECTED NOT DETECTED    Matteus Mcnelly, Rande Lawman 11/10/2017  12:58 PM

## 2017-11-10 NOTE — Progress Notes (Signed)
[facility] Infusion Coordinator will follow pt with ID team to support Home Infusion Pharm[facility] services for home IV ABX if needed at DC.  If patient discharges after hours, please call [phone].   Alexandra Luna 11/10/2017, 8:40 PM

## 2017-11-10 NOTE — Evaluation (Signed)
Occupational Therapy Evaluation Patient Details Name: Alexandra Luna MRN: [id number] DOB: [birth year] Today's Date: 11/10/2017    History of Present Illness 84 year old female history of hemorrhoids, anxiety, A. fib on chronic anticoagulation with Eliquis, history of remote breast cancer, renovascular hypertension, diabetes, dementia presents to the ED from [place] secondary to worsening confusion.  Patient recently treated for cellulitis of the lower extremity.   Clinical Impression   PT admitted with AMS. Pt currently with functional limitiations due to the deficits listed below (see OT problem list). Pt currently requires max (A) for sit<>stand with pain limiting continued movement. Pt with c/o L LE pain. Pt reports "whole left side" when asked to point or say where the pain was located.  Pt will benefit from skilled OT to increase their independence and safety with adls and balance to allow discharge SNF.     Follow Up Recommendations  SNF    Equipment Recommendations  3 in 1 bedside commode;Wheelchair (measurements OT);Wheelchair cushion (measurements OT);Hospital bed    Recommendations for Other Services       Precautions / Restrictions Precautions Precautions: Fall      Mobility Bed Mobility               General bed mobility comments: in chair on arrival  Transfers Overall transfer level: Needs assistance Equipment used: Rolling walker (2 wheeled) Transfers: Sit to/from Stand Sit to Stand: Max assist         General transfer comment: pt unable to static stand. does not tolerate L LE on the floor. pt resist and retunring to sititng. "I can't" I just can't    Balance Overall balance assessment: Needs assistance   Sitting balance-Leahy Scale: Poor       Standing balance-Leahy Scale: Zero                             ADL either performed or assessed with clinical judgement   ADL Overall ADL's : Needs  assistance/impaired Eating/Feeding: Minimal assistance Eating/Feeding Details (indicate cue type and reason): pt noted to have wet linen on arrival from spilling PO intake of coffee . pt states "i can't seem to hit my mouth" Grooming: Minimal assistance   Upper Body Bathing: Minimal assistance   Lower Body Bathing: +2 for physical assistance;+2 for safety/equipment           Toilet Transfer: +2 for physical assistance;+2 for safety/equipment;Maximal assistance             General ADL Comments: pt completed sit<stand from chair but terminated task, resistenting and descneding to chair immediatley     Vision         Perception     Praxis      Pertinent Vitals/Pain Pain Assessment: Faces Faces Pain Scale: Hurts whole lot Pain Location: L LE Pain Descriptors / Indicators: Discomfort;Constant Pain Intervention(s): Monitored during session;Premedicated before session;Repositioned     Hand Dominance Right   Extremity/Trunk Assessment Upper Extremity Assessment Upper Extremity Assessment: Generalized weakness   Lower Extremity Assessment Lower Extremity Assessment: LLE deficits/detail LLE Deficits / Details: pt guarding L LE. pt calls out in discomfort if L LE touched. Pt unable to sustain weight bearing on LLE   Cervical / Trunk Assessment Cervical / Trunk Assessment: Kyphotic   Communication Communication Communication: No difficulties   Cognition Arousal/Alertness: Awake/alert Behavior During Therapy: Flat affect Overall Cognitive Status: History of cognitive impairments - at baseline  General Comments: pt unable to explain pain in L LE . pt only starts the Left one   General Comments  risk for skin break down and need for repositioning and shifting weight    Exercises     Shoulder Instructions      Home Living Family/patient expects to be discharged to:: Assisted living                              Home Equipment: Walker - 4 wheels   Additional Comments: [employer]      Prior Functioning/Environment Level of Independence: Needs assistance    ADL's / Homemaking Assistance Needed: has an aide to help             OT Problem List: Decreased strength;Decreased activity tolerance;Impaired balance (sitting and/or standing);Decreased safety awareness;Decreased cognition;Decreased knowledge of use of DME or AE;Decreased knowledge of precautions;Pain;Obesity      OT Treatment/Interventions: Self-care/ADL training;Therapeutic exercise;DME and/or AE instruction;Therapeutic activities;Modalities;Cognitive remediation/compensation;Visual/perceptual remediation/compensation;Patient/family education;Balance training    OT Goals(Current goals can be found in the care plan section) Acute Rehab OT Goals Patient Stated Goal: drink my coffee OT Goal Formulation: Patient unable to participate in goal setting Time For Goal Achievement: 11/23/17 Potential to Achieve Goals: Good  OT Frequency: Min 2X/week   Barriers to D/C:            Co-evaluation              AM-PAC PT "6 Clicks" Daily Activity     Outcome Measure Help from another person eating meals?: A Little Help from another person taking care of personal grooming?: A Lot Help from another person toileting, which includes using toliet, bedpan, or urinal?: A Lot Help from another person bathing (including washing, rinsing, drying)?: A Lot Help from another person to put on and taking off regular upper body clothing?: A Lot Help from another person to put on and taking off regular lower body clothing?: A Lot 6 Click Score: 13   End of Session Equipment Utilized During Treatment: Gait belt Nurse Communication: Mobility status;Precautions  Activity Tolerance: Patient limited by pain Patient left: in chair;with call bell/phone within reach;with chair alarm set  OT Visit Diagnosis: Unsteadiness on feet  (R26.81);Pain Pain - Right/Left: Left Pain - part of body: Leg                Time: 9169-4503 OT Time Calculation (min): 14 min Charges:  OT General Charges $OT Visit: 1 Visit OT Evaluation $OT Eval Moderate Complexity: 1 Mod   Jeri Modena   OTR/L Pager: [phone] Office: [phone] .   Parke Poisson B 11/10/2017, 11:58 AM

## 2017-11-11 ENCOUNTER — Observation Stay (HOSPITAL_COMMUNITY): Payer: Medicare Other

## 2017-11-11 DIAGNOSIS — R296 Repeated falls: Secondary | ICD-10-CM | POA: Diagnosis present

## 2017-11-11 DIAGNOSIS — Z888 Allergy status to other drugs, medicaments and biological substances status: Secondary | ICD-10-CM

## 2017-11-11 DIAGNOSIS — E860 Dehydration: Secondary | ICD-10-CM | POA: Diagnosis present

## 2017-11-11 DIAGNOSIS — R7881 Bacteremia: Secondary | ICD-10-CM

## 2017-11-11 DIAGNOSIS — B952 Enterococcus as the cause of diseases classified elsewhere: Secondary | ICD-10-CM

## 2017-11-11 DIAGNOSIS — M25552 Pain in left hip: Secondary | ICD-10-CM | POA: Diagnosis present

## 2017-11-11 DIAGNOSIS — R195 Other fecal abnormalities: Secondary | ICD-10-CM | POA: Diagnosis not present

## 2017-11-11 DIAGNOSIS — F0390 Unspecified dementia without behavioral disturbance: Secondary | ICD-10-CM | POA: Diagnosis present

## 2017-11-11 DIAGNOSIS — Z7901 Long term (current) use of anticoagulants: Secondary | ICD-10-CM | POA: Diagnosis not present

## 2017-11-11 DIAGNOSIS — I4891 Unspecified atrial fibrillation: Secondary | ICD-10-CM | POA: Diagnosis present

## 2017-11-11 DIAGNOSIS — I340 Nonrheumatic mitral (valve) insufficiency: Secondary | ICD-10-CM | POA: Diagnosis not present

## 2017-11-11 DIAGNOSIS — S32592A Other specified fracture of left pubis, initial encounter for closed fracture: Secondary | ICD-10-CM | POA: Diagnosis present

## 2017-11-11 DIAGNOSIS — K649 Unspecified hemorrhoids: Secondary | ICD-10-CM | POA: Diagnosis present

## 2017-11-11 DIAGNOSIS — E1122 Type 2 diabetes mellitus with diabetic chronic kidney disease: Secondary | ICD-10-CM | POA: Diagnosis present

## 2017-11-11 DIAGNOSIS — K921 Melena: Secondary | ICD-10-CM | POA: Diagnosis present

## 2017-11-11 DIAGNOSIS — Z880 Allergy status to penicillin: Secondary | ICD-10-CM

## 2017-11-11 DIAGNOSIS — E782 Mixed hyperlipidemia: Secondary | ICD-10-CM | POA: Diagnosis present

## 2017-11-11 DIAGNOSIS — I481 Persistent atrial fibrillation: Secondary | ICD-10-CM | POA: Diagnosis not present

## 2017-11-11 DIAGNOSIS — R410 Disorientation, unspecified: Secondary | ICD-10-CM | POA: Diagnosis not present

## 2017-11-11 DIAGNOSIS — Z515 Encounter for palliative care: Secondary | ICD-10-CM | POA: Diagnosis present

## 2017-11-11 DIAGNOSIS — Z887 Allergy status to serum and vaccine status: Secondary | ICD-10-CM

## 2017-11-11 DIAGNOSIS — R7989 Other specified abnormal findings of blood chemistry: Secondary | ICD-10-CM | POA: Diagnosis present

## 2017-11-11 DIAGNOSIS — R109 Unspecified abdominal pain: Secondary | ICD-10-CM | POA: Diagnosis not present

## 2017-11-11 DIAGNOSIS — Z87891 Personal history of nicotine dependence: Secondary | ICD-10-CM

## 2017-11-11 DIAGNOSIS — Z91048 Other nonmedicinal substance allergy status: Secondary | ICD-10-CM

## 2017-11-11 DIAGNOSIS — N179 Acute kidney failure, unspecified: Secondary | ICD-10-CM | POA: Diagnosis present

## 2017-11-11 DIAGNOSIS — R1011 Right upper quadrant pain: Secondary | ICD-10-CM | POA: Diagnosis not present

## 2017-11-11 DIAGNOSIS — I129 Hypertensive chronic kidney disease with stage 1 through stage 4 chronic kidney disease, or unspecified chronic kidney disease: Secondary | ICD-10-CM | POA: Diagnosis present

## 2017-11-11 DIAGNOSIS — F419 Anxiety disorder, unspecified: Secondary | ICD-10-CM | POA: Diagnosis present

## 2017-11-11 DIAGNOSIS — G9340 Encephalopathy, unspecified: Secondary | ICD-10-CM | POA: Diagnosis present

## 2017-11-11 DIAGNOSIS — Z66 Do not resuscitate: Secondary | ICD-10-CM | POA: Diagnosis present

## 2017-11-11 DIAGNOSIS — X58XXXA Exposure to other specified factors, initial encounter: Secondary | ICD-10-CM | POA: Diagnosis present

## 2017-11-11 DIAGNOSIS — L03031 Cellulitis of right toe: Secondary | ICD-10-CM

## 2017-11-11 DIAGNOSIS — Z853 Personal history of malignant neoplasm of breast: Secondary | ICD-10-CM | POA: Diagnosis not present

## 2017-11-11 DIAGNOSIS — G9341 Metabolic encephalopathy: Secondary | ICD-10-CM | POA: Diagnosis present

## 2017-11-11 DIAGNOSIS — Z79899 Other long term (current) drug therapy: Secondary | ICD-10-CM | POA: Diagnosis not present

## 2017-11-11 DIAGNOSIS — N189 Chronic kidney disease, unspecified: Secondary | ICD-10-CM | POA: Diagnosis not present

## 2017-11-11 DIAGNOSIS — Z881 Allergy status to other antibiotic agents status: Secondary | ICD-10-CM

## 2017-11-11 DIAGNOSIS — B9689 Other specified bacterial agents as the cause of diseases classified elsewhere: Secondary | ICD-10-CM | POA: Diagnosis present

## 2017-11-11 LAB — BASIC METABOLIC PANEL
Anion gap: 10 (ref 5–15)
BUN: 42 mg/dL — AB (ref 8–23)
CO2: 20 mmol/L — ABNORMAL LOW (ref 22–32)
Calcium: 8.4 mg/dL — ABNORMAL LOW (ref 8.9–10.3)
Chloride: 103 mmol/L (ref 98–111)
Creatinine, Ser: 1 mg/dL (ref 0.44–1.00)
GFR calc Af Amer: 57 mL/min — ABNORMAL LOW (ref >60)
GFR, EST NON AFRICAN AMERICAN: 49 mL/min — AB (ref >60)
GLUCOSE: 102 mg/dL — AB (ref 70–99)
Potassium: 3.9 mmol/L (ref 3.5–5.1)
Sodium: 133 mmol/L — ABNORMAL LOW (ref 135–145)

## 2017-11-11 LAB — CBC WITH DIFFERENTIAL/PLATELET
Abs Immature Granulocytes: 0.1 10*3/uL (ref 0.0–0.1)
BASOS PCT: 1 %
Basophils Absolute: 0.1 10*3/uL (ref 0.0–0.1)
EOS ABS: 0.4 10*3/uL (ref 0.0–0.7)
EOS PCT: 4 %
HCT: 31.3 % — ABNORMAL LOW (ref 36.0–46.0)
Hemoglobin: 10 g/dL — ABNORMAL LOW (ref 12.0–15.0)
Immature Granulocytes: 1 %
Lymphocytes Relative: 9 %
Lymphs Abs: 0.9 10*3/uL (ref 0.7–4.0)
MCH: 30.3 pg (ref 26.0–34.0)
MCHC: 31.9 g/dL (ref 30.0–36.0)
MCV: 94.8 fL (ref 78.0–100.0)
MONO ABS: 1 10*3/uL (ref 0.1–1.0)
MONOS PCT: 10 %
Neutro Abs: 8.1 10*3/uL — ABNORMAL HIGH (ref 1.7–7.7)
Neutrophils Relative %: 77 %
PLATELETS: 190 10*3/uL (ref 150–400)
RBC: 3.3 MIL/uL — ABNORMAL LOW (ref 3.87–5.11)
RDW: 16.5 % — AB (ref 11.5–15.5)
WBC: 10.6 10*3/uL — ABNORMAL HIGH (ref 4.0–10.5)

## 2017-11-11 LAB — ECHOCARDIOGRAM COMPLETE
HEIGHTINCHES: 68 in
WEIGHTICAEL: 2790.14 [oz_av]

## 2017-11-11 LAB — FOLATE RBC
Folate, RBC: >1761 ng/mL (ref >498)
HEMATOCRIT: 35.2 % (ref 34.0–46.6)

## 2017-11-11 MED ORDER — CEFTRIAXONE SODIUM 2 G IJ SOLR
2.0000 g | Freq: Two times a day (BID) | INTRAMUSCULAR | Status: DC
  Administered 2017-11-11 – 2017-11-12 (×2): 2 g via INTRAVENOUS
  Filled 2017-11-11 (×3): qty 20

## 2017-11-11 MED ORDER — SODIUM CHLORIDE 0.9 % IV SOLN
2.0000 g | Freq: Four times a day (QID) | INTRAVENOUS | Status: DC
  Administered 2017-11-11 – 2017-11-15 (×17): 2 g via INTRAVENOUS
  Filled 2017-11-11 (×19): qty 2000

## 2017-11-11 NOTE — Consult Note (Signed)
[facility] for Infectious Disease    Date of Admission:  11/08/2017   Total days of antibiotics 2        Day 2 vancomycin                Reason for Consult: Enterococcus Faecalis bacteremia     Referring Provider: CHAMP  Primary Care Provider: Reymundo Poll, MD   Assessment: Alexandra Luna is a 84 y.o. female with baseline dementia and resides at SNF locally admitted on 8/09 with low-grade fevers and worsened confusion. She has been feeling poorly since at least 8/03 with a possible cellulitis involving her left foot/toe. This has resolved completely and now only flaking/scaling isolated to this foot from previous swelling.   Urine was clean, no other wounds or diarrhea to explain. She was not tolerant of TTE- Needs TEE to evaluate formally but would consider initiating presumptive endocarditis treatment with her underlying dementia.   Regarding her PCN allergy - she has in the past tolerated amoxicillin in the past; likely insignificant reaction. Will challenge with ampicillin - extremely likely this is sensitive e faecalis. Follow susceptibilities.   Plan: 1. Will change vanco to ampicillin 2. Add ceftriaxone 2 gm IV BID for presumed endocardial involvement  3. TEE vs presumptive treatment for endocarditis - will d/w Dr. Johnnye Sima  4. Follow susceptibilities  5. Repeat blood cultures in AM.   Principal Problem:   Acute metabolic encephalopathy Active Problems:   History of breast cancer in female   AF (atrial fibrillation) (HCC)   Essential hypertension   Chronic anticoagulation   Generalized weakness   Blood in stool   Elevated temperature   Leukocytosis   Metabolic encephalopathy   Palliative care encounter   Bacteremia due to Gram-positive bacteria   . apixaban  2.5 mg Oral BID  . atenolol  25 mg Oral QHS  . calcium carbonate  1,250 mg Oral Daily  . fluticasone  1 spray Each Nare QHS  . HYDROcodone-acetaminophen  0.5 tablet Oral BID  . loratadine  10 mg  Oral Daily  . multivitamins with iron  1 tablet Oral Daily  . psyllium  1 packet Oral Daily  . senna  1 tablet Oral Once per day on Mon Wed Fri  . sertraline  25 mg Oral QHS  . verapamil  240 mg Oral QHS    HPI: Alexandra Luna is a 84 y.o. female with confusion and low-grade fever as well as +FOBT. She is from a SNF. She has dementia and not able to meaningfully contribute to   She received 1g Ceftraixone on 8/3 and started on oral keflex 1000 mg BID for cellulitis of the 4th toe on the right foot that evolved from a callous. She had been feeling tired and poorly for over a week prior to this.   Review of Systems: Review of Systems  Unable to perform ROS: Dementia    Past Medical History:  Diagnosis Date  . Anxiety   . Atrial fibrillation (HCC)    On chronic Eliquis  . Breast cancer ([place]) dx'd 1997   left; lt lumpectomy and XRT  . Mixed hyperlipidemia   . Palpitations   . Pre-diabetes   . Renal hypertension 03/07/2005   no evidence of significant diameter reduction, dissection, tortuosity, FMD or orther vascular abnormality; kidneys equal in size, symmetry, with normal cortex and medulla    Social History   Tobacco Use  . Smoking status: Former Smoker  Packs/day: 0.75    Years: 10.00    Pack years: 7.50    Types: Cigarettes    Last attempt to quit: 04/02/1988    Years since quitting: 29.6  . Smokeless tobacco: Never Used  Substance Use Topics  . Alcohol use: Yes    Alcohol/week: 0.0 standard drinks    Comment: 1/4 glass of wine every 2 weeks  . Drug use: No    Family History  Problem Relation Age of Onset  . Heart failure Mother   . Breast cancer Mother   . Hypertension Father   . Kidney cancer Father   . Stroke Maternal Grandmother    Allergies  Allergen Reactions  . Influenza Vaccines Other (See Comments)    Pt's arm swelled and dr told her not to take vaccine again.  [place] Fleeting [Trandolapril-Verapamil Hcl Er] Other (See Comments)    Increase blood pressure    . Adhesive [Tape] Rash    Adhesive bandaids   . Azithromycin Rash  . Lipitor [Atorvastatin] Rash  . Penicillins Rash    OBJECTIVE: Blood pressure (!) 125/93, pulse 65, temperature 98.5 F (36.9 C), temperature source Oral, resp. rate 18, height 5\' 8"  (1.727 m), weight 79.1 kg, SpO2 98 %.  Physical Exam  Constitutional: She appears well-developed.  Resting in bed. Does not open eyes or interact with questions.   HENT:  Mouth/Throat: Oropharynx is clear and moist.  Eyes:  Eyes closed   Cardiovascular: Normal rate and normal heart sounds. An irregular rhythm present.  No murmur heard. Pulmonary/Chest: Effort normal and breath sounds normal. No respiratory distress.  Abdominal: Soft. Bowel sounds are normal. She exhibits no distension.  Musculoskeletal: She exhibits no edema.  Lymphadenopathy:    She has no cervical adenopathy.  Neurological:  Lethargic.  Skin: Skin is dry.  Cool extremities with some mottling of fingers > 2 sec cap refill. Multiple varicosities.    Lab Results Lab Results  Component Value Date   WBC 10.6 (H) 11/11/2017   HGB 10.0 (L) 11/11/2017   HCT 31.3 (L) 11/11/2017   MCV 94.8 11/11/2017   PLT 190 11/11/2017    Lab Results  Component Value Date   CREATININE 1.00 11/11/2017   BUN 42 (H) 11/11/2017   NA 133 (L) 11/11/2017   K 3.9 11/11/2017   CL 103 11/11/2017   CO2 20 (L) 11/11/2017    Lab Results  Component Value Date   ALT 24 11/08/2017   AST 32 11/08/2017   ALKPHOS 89 11/08/2017   BILITOT 1.6 (H) 11/08/2017     Microbiology: BCx 8/10 >> E faecalis    Janene Madeira, MSN, NP-C [facility] for Infectious Disease [facility] Cell: [phone] Pager: [phone]  11/11/2017 3:13 PM

## 2017-11-11 NOTE — Progress Notes (Signed)
PROGRESS NOTE    Alexandra Luna  BDZ:[id number] DOB: 1926/09/30 DOA: 11/08/2017 PCP: Reymundo Poll, MD    Brief Narrative Patient is a 84 year old female history of hemorrhoids, anxiety, A. fib on chronic anticoagulation with Eliquis, history of remote breast cancer, renovascular hypertension, diabetes, dementia presents to the ED from skilled nursing facility secondary to worsening confusion.  Patient recently treated for cellulitis of the lower extremity.   Assessment & Plan:   Principal Problem:   Acute metabolic encephalopathy Active Problems:   Bacteremia due to Gram-positive bacteria   History of breast cancer in female   AF (atrial fibrillation) (HCC)   Essential hypertension   Chronic anticoagulation   Generalized weakness   Blood in stool   Elevated temperature   Leukocytosis   Metabolic encephalopathy   Palliative care encounter  #1 acute metabolic encephalopathy Likely secondary to bacteremia in the setting of dehydration.  Patient presented from nursing facility with worsening confusion after recently being treated for cellulitis.  Patient with a baseline dementia and baseline unknown.  Patient's mental status is slowly improving however not at baseline.  Patient following commands.  Patient supposedly had received a dose of Ativan the night of 11/08/2017.  Chest x-ray negative.  Urinalysis with nitrite negative leukocytes negative.  RPR pending.  Vitamin B12 levels at 792.  RBC folate pending.  TSH at 3.236.  CT head negative for any acute abnormalities.  Blood cultures positive for gram-positive bacteremia/Enterococcus faecalis bacteremia.  Continue empiric IV vancomycin.  Gentle hydration.  Supportive care. Follow.  2.  Gram-positive bacteremia/Enterococcus faecalis bacteremia Noted on blood cultures x2.  BCID positive for enterococcus species.  2D echo pending.  Continue empiric IV vancomycin.  Will likely need repeat blood cultures in the next 24 to 48 hours.  Chart has  been reviewed by ID, formal ID consultation pending.  3.??  Rectal bleeding Per admitting physician patient with a long history of blood in his stool.  Patient is DNR.  H&H currently stable.  No overt bleeding noted overnight.  Patient also with a history of hemorrhoids.  Patient on anticoagulation with Eliquis.  Will follow for now.  4.  Leukocytosis Leukocytosis likely secondary to bacteremia noted on blood cultures which were drawn.  Chest x-ray negative.  Urinalysis nitrite negative leukocytes negative.  Continue IV vancomycin.    5.  Acute kidney injury Patient noted to have a creatinine of 1.50 from 1.01 on admission.  Patient looks clinically dry on examination.  Likely secondary to a prerenal azotemia in the setting of diuretics.  Discontinued diuretics.  Urinalysis with 30 protein nitrite negative leukocytes negative.  Urine sodium of 19.  Renal function trending down with gentle hydration.  Follow.   6.  Atrial fibrillation Continue atenolol for rate control.  Eliquis for anticoagulation.  Follow.    7.  Generalized weakness Questionable etiology.  PT/OT.    DVT prophylaxis: Eliquis Code Status: DNR Family Communication: No family at bedside. Disposition Plan: To be determined.  Likely back to skilled nursing facility once medically stable and back at baseline.   Consultants:   Palliative care: Altha Harm, NP 11/09/2017  ID pending  Procedures:   Chest x-ray 11/09/2017, 11/08/2017  Renal ultrasound 11/09/2017   2D echo 11/11/2017  Antimicrobials:   IV vancomycin 11/10/2017   Subjective: Patient sleeping however easily arousable.  Confusion slowly improving however likely not at baseline.  No chest pain or shortness of breath.    Objective: Vitals:   11/10/17 1428 11/10/17 2042 11/10/17  2128 11/11/17 0413  BP: 110/63 124/71 124/71 (!) 101/56  Pulse: 68 76  68  Resp: 17 20  18   Temp: 97.6 F (36.4 C) 98.4 F (36.9 C)  98.2 F (36.8 C)  TempSrc: Oral Oral   Oral  SpO2: 97% 98%  97%  Weight:    79.1 kg  Height:        Intake/Output Summary (Last 24 hours) at 11/11/2017 1317 Last data filed at 11/11/2017 0933 Gross per 24 hour  Intake 1591.69 ml  Output 601 ml  Net 990.69 ml   Filed Weights   11/09/17 1434 11/10/17 0500 11/11/17 0413  Weight: 77.9 kg 79.5 kg 79.1 kg    Examination:  General exam: Alert.  Dry mucous membranes. Respiratory system: CTAB.  No wheezes, no crackles, no rhonchi.  Respiratory effort normal. Cardiovascular system: RRR.  No JVD.  No lower extremity edema.  Gastrointestinal system: Abdomen is nontender, nondistended, soft, positive bowel sounds.   Central nervous system: Alert.  Moving extremities spontaneously.  Extremities: Symmetric 5 x 5 power. Skin: No rashes, lesions or ulcers Psychiatry: Judgement and insight appear poor-fair. Mood & affect appropriate.     Data Reviewed: I have personally reviewed following labs and imaging studies  CBC: Recent Labs  Lab 11/08/17 0957 11/09/17 0503 11/10/17 0532 11/11/17 0509  WBC 14.7* 13.9* 12.3* 10.6*  NEUTROABS 12.8*  --  9.9* 8.1*  HGB 12.7 11.7* 11.0* 10.0*  HCT 39.7 36.0 33.5* 31.3*  MCV 93.2 93.3 91.3 94.8  PLT 221 182 194 354   Basic Metabolic Panel: Recent Labs  Lab 11/08/17 0957 11/09/17 0503 11/10/17 0532 11/11/17 0509  NA 133* 133* 134* 133*  K 4.3 4.8 4.2 3.9  CL 97* 99 100 103  CO2 22 21* 23 20*  GLUCOSE 115* 106* 97 102*  BUN 26* 37* 52* 42*  CREATININE 1.01* 1.50* 1.43* 1.00  CALCIUM 10.1 9.6 9.4 8.4*   GFR: Estimated Creatinine Clearance: 43 mL/min (by C-G formula based on SCr of 1 mg/dL). Liver Function Tests: Recent Labs  Lab 11/08/17 0957  AST 32  ALT 24  ALKPHOS 89  BILITOT 1.6*  PROT 6.7  ALBUMIN 3.3*   No results for input(s): LIPASE, AMYLASE in the last 168 hours. No results for input(s): AMMONIA in the last 168 hours. Coagulation Profile: No results for input(s): INR, PROTIME in the last 168  hours. Cardiac Enzymes: Recent Labs  Lab 11/08/17 0957 11/08/17 1457 11/08/17 2046 11/09/17 0503  TROPONINI 0.04* 0.05* 0.04* 0.05*   BNP (last 3 results) No results for input(s): PROBNP in the last 8760 hours. HbA1C: No results for input(s): HGBA1C in the last 72 hours. CBG: No results for input(s): GLUCAP in the last 168 hours. Lipid Profile: No results for input(s): CHOL, HDL, LDLCALC, TRIG, CHOLHDL, LDLDIRECT in the last 72 hours. Thyroid Function Tests: Recent Labs    11/09/17 1524  TSH 3.236   Anemia Panel: Recent Labs    11/09/17 1524  VITAMINB12 792   Sepsis Labs: No results for input(s): PROCALCITON, LATICACIDVEN in the last 168 hours.  Recent Results (from the past 240 hour(s))  Urine culture     Status: None   Collection Time: 11/02/17  4:02 PM  Result Value Ref Range Status   Specimen Description URINE, CATHETERIZED  Final   Special Requests NONE  Final   Culture   Final    NO GROWTH Performed at [facility], [address] [place], [place] [address]  Report Status 11/03/2017 FINAL  Final  MRSA PCR Screening     Status: Abnormal   Collection Time: 11/08/17  3:54 PM  Result Value Ref Range Status   MRSA by PCR POSITIVE (A) NEGATIVE Final    Comment:        The GeneXpert MRSA Assay (FDA approved for NASAL specimens only), is one component of a comprehensive MRSA colonization surveillance program. It is not intended to diagnose MRSA infection nor to guide or monitor treatment for MRSA infections. RESULT CALLED TO, READ BACK BY AND VERIFIED WITH: Marjory Sneddon RN 11/08/17 1838 JDW Performed at [facility], [place] [address] [place], [place] [address]   Urine Culture     Status: None   Collection Time: 11/09/17  4:35 PM  Result Value Ref Range Status   Specimen Description URINE, CATHETERIZED  Final   Special Requests NONE  Final   Culture   Final    NO GROWTH Performed at [facility], [address], [place], [place]  [address]    Report Status 11/10/2017 FINAL  Final  Culture, blood (Routine X 2) w Reflex to ID Panel     Status: Abnormal (Preliminary result)   Collection Time: 11/09/17  6:42 PM  Result Value Ref Range Status   Specimen Description BLOOD LEFT ANTECUBITAL  Final   Special Requests   Final    BOTTLES DRAWN AEROBIC ONLY Blood Culture adequate volume   Culture  Setup Time   Final    GRAM POSITIVE COCCI IN PAIRS AND CHAINS AEROBIC BOTTLE ONLY CRITICAL RESULT CALLED TO, READ BACK BY AND VERIFIED WITH: R. RUMBARGER, RPHARMD AT 1250 ON 11/10/17 BY C. JESSUP, MLT. Performed at [facility], [place] [address], [place], [place] [address]    Culture ENTEROCOCCUS FAECALIS (A)  Final   Report Status PENDING  Incomplete  Blood Culture ID Panel (Reflexed)     Status: Abnormal   Collection Time: 11/09/17  6:42 PM  Result Value Ref Range Status   Enterococcus species DETECTED (A) NOT DETECTED Final    Comment: CRITICAL RESULT CALLED TO, READ BACK BY AND VERIFIED WITH: R. RUMBARGER, RPHARMD AT 1250 ON 11/10/17 BY C. JESSUP, MLT.    Vancomycin resistance NOT DETECTED NOT DETECTED Final   Listeria monocytogenes NOT DETECTED NOT DETECTED Final   Staphylococcus species NOT DETECTED NOT DETECTED Final   Staphylococcus aureus NOT DETECTED NOT DETECTED Final   Streptococcus species NOT DETECTED NOT DETECTED Final   Streptococcus agalactiae NOT DETECTED NOT DETECTED Final   Streptococcus pneumoniae NOT DETECTED NOT DETECTED Final   Streptococcus pyogenes NOT DETECTED NOT DETECTED Final   Acinetobacter baumannii NOT DETECTED NOT DETECTED Final   Enterobacteriaceae species NOT DETECTED NOT DETECTED Final   Enterobacter cloacae complex NOT DETECTED NOT DETECTED Final   Escherichia coli NOT DETECTED NOT DETECTED Final   Klebsiella oxytoca NOT DETECTED NOT DETECTED Final   Klebsiella pneumoniae NOT DETECTED NOT DETECTED Final   Proteus species NOT DETECTED NOT DETECTED Final   Serratia marcescens NOT DETECTED  NOT DETECTED Final   Haemophilus influenzae NOT DETECTED NOT DETECTED Final   Neisseria meningitidis NOT DETECTED NOT DETECTED Final   Pseudomonas aeruginosa NOT DETECTED NOT DETECTED Final   Candida albicans NOT DETECTED NOT DETECTED Final   Candida glabrata NOT DETECTED NOT DETECTED Final   Candida krusei NOT DETECTED NOT DETECTED Final   Candida parapsilosis NOT DETECTED NOT DETECTED Final   Candida tropicalis NOT DETECTED NOT DETECTED Final  Comment: Performed at [facility], [place] [address], [place], [place] [address]  Culture, blood (Routine X 2) w Reflex to ID Panel     Status: Abnormal (Preliminary result)   Collection Time: 11/09/17  6:50 PM  Result Value Ref Range Status   Specimen Description BLOOD RIGHT HAND  Final   Special Requests   Final    BOTTLES DRAWN AEROBIC AND ANAEROBIC Blood Culture results may not be optimal due to an inadequate volume of blood received in culture bottles   Culture  Setup Time   Final    GRAM POSITIVE COCCI AEROBIC BOTTLE ONLY CRITICAL RESULT CALLED TO, READ BACK BY AND VERIFIED WITH: R. RUMBARGER, RPHARMD AT 1250 ON 11/10/17 BY C. JESSUP, MLT. Performed at [facility], [place] [address] [place], [place] [address]    Culture ENTEROCOCCUS FAECALIS (A)  Final   Report Status PENDING  Incomplete         Radiology Studies: Ct Head Wo Contrast  Result Date: 11/09/2017 CLINICAL DATA:  Encephalopathy. EXAM: CT HEAD WITHOUT CONTRAST TECHNIQUE: Contiguous axial images were obtained from the base of the skull through the vertex without intravenous contrast. COMPARISON:  CT head without contrast 05/28/2017. FINDINGS: Brain: Mild atrophy and white matter changes bilaterally are stable. A remote lacunar infarct in the posterior limb of the right internal capsule is stable. A remote lacunar infarct involving the inferior right cerebellum is stable. No acute infarct, hemorrhage, or mass lesion is present. The ventricles are proportionate to the  degree of atrophy. No significant extra-axial fluid collection is present. Vascular: Atherosclerotic calcifications are present within the cavernous internal carotid arteries bilaterally. There is no hyperdense vessel. Skull: The skull base is within normal limits. The craniocervical junction is normal. Hyperostosis front turn is talus is again noted. Sinuses/Orbits: The paranasal sinuses and mastoid air cells are clear. Bilateral lens replacements are present. Globes and orbits are within normal limits otherwise. IMPRESSION: 1. Acute intracranial abnormality or significant interval change. 2. Stable atrophy and white matter disease. This likely reflects the sequela of chronic microvascular ischemia. Electronically Signed   By: San Morelle M.D.   On: 11/09/2017 20:47        Scheduled Meds: . apixaban  2.5 mg Oral BID  . atenolol  25 mg Oral QHS  . calcium carbonate  1,250 mg Oral Daily  . fluticasone  1 spray Each Nare QHS  . HYDROcodone-acetaminophen  0.5 tablet Oral BID  . loratadine  10 mg Oral Daily  . multivitamins with iron  1 tablet Oral Daily  . psyllium  1 packet Oral Daily  . senna  1 tablet Oral Once per day on Mon Wed Fri  . sertraline  25 mg Oral QHS  . verapamil  240 mg Oral QHS   Continuous Infusions: . sodium chloride 50 mL/hr at 11/11/17 1034  . vancomycin 1,000 mg (11/10/17 1411)     LOS: 0 days    Time spent: 35 minutes    Irine Seal, MD [facility] Pager [phone]  If 7PM-7AM, please contact night-coverage [email or web address] Password [email or web address] 11/11/2017, 1:17 PM

## 2017-11-11 NOTE — Progress Notes (Addendum)
  Echocardiogram 2D Echocardiogram has been performed.  Patient extremely confused. Patient pleading for exam to stop.  Alexandra Luna L [place] 11/11/2017, 10:03 AM

## 2017-11-12 ENCOUNTER — Inpatient Hospital Stay (HOSPITAL_COMMUNITY): Payer: Medicare Other

## 2017-11-12 DIAGNOSIS — R410 Disorientation, unspecified: Secondary | ICD-10-CM

## 2017-11-12 LAB — CULTURE, BLOOD (ROUTINE X 2): Special Requests: ADEQUATE

## 2017-11-12 LAB — BASIC METABOLIC PANEL
Anion gap: 11 (ref 5–15)
BUN: 36 mg/dL — AB (ref 8–23)
CHLORIDE: 104 mmol/L (ref 98–111)
CO2: 20 mmol/L — AB (ref 22–32)
CREATININE: 0.97 mg/dL (ref 0.44–1.00)
Calcium: 8.8 mg/dL — ABNORMAL LOW (ref 8.9–10.3)
GFR calc Af Amer: 59 mL/min — ABNORMAL LOW (ref >60)
GFR calc non Af Amer: 51 mL/min — ABNORMAL LOW (ref >60)
Glucose, Bld: 95 mg/dL (ref 70–99)
POTASSIUM: 4.5 mmol/L (ref 3.5–5.1)
SODIUM: 135 mmol/L (ref 135–145)

## 2017-11-12 LAB — CBC
HEMATOCRIT: 33.3 % — AB (ref 36.0–46.0)
Hemoglobin: 10.4 g/dL — ABNORMAL LOW (ref 12.0–15.0)
MCH: 29.9 pg (ref 26.0–34.0)
MCHC: 31.2 g/dL (ref 30.0–36.0)
MCV: 95.7 fL (ref 78.0–100.0)
Platelets: 186 10*3/uL (ref 150–400)
RBC: 3.48 MIL/uL — AB (ref 3.87–5.11)
RDW: 16.6 % — AB (ref 11.5–15.5)
WBC: 10.6 10*3/uL — ABNORMAL HIGH (ref 4.0–10.5)

## 2017-11-12 NOTE — NC FL2 (Signed)
[place] LEVEL OF CARE SCREENING TOOL     IDENTIFICATION  Patient Name: Alexandra Luna Birthdate: June 28, 1926 Sex: female Admission Date (Current Location): 11/08/2017  [facility] [place] Number:  Herbalist and Address:  [facility], [place] [address], [place], [place] [address]      Provider Number: [id number]  Attending Physician Name and Address:  Eugenie Filler, MD  Relative Name and Phone Number:  Vernie Murders, daughter, [phone]    Current Level of Care: Hospital Recommended Level of Care: [place] Prior Approval Number:    Date Approved/Denied:   PASRR Number: [id number]  Discharge Plan: SNF    Current Diagnoses: Patient Active Problem List   Diagnosis Date Noted  . Dementia without behavioral disturbance   . Enterococcal bacteremia 11/10/2017  . Palliative care encounter   . Blood in stool 11/08/2017  . Elevated temperature 11/08/2017  . Leukocytosis 11/08/2017  . Acute metabolic encephalopathy 51/05/5850  . Metabolic encephalopathy 77/82/4235  . CHF exacerbation ([place]) 11/01/2016  . Chronic diastolic CHF (congestive heart failure) ([place]) 09/06/2016  . ARF (acute renal failure) ([place]) 09/06/2016  . Weakness generalized 09/06/2016  . COPD exacerbation ([place]) 04/01/2015  . Bilateral pleural effusion 08/30/2014  . Multiple pulmonary nodules 08/30/2014  . Acute diastolic congestive heart failure ([place]) 08/14/2014  . Acute respiratory failure with hypoxia ([place]) 08/14/2014  . Generalized weakness 06/28/2014  . Chronic anticoagulation 12/08/2012  . AF (atrial fibrillation) ([place]) 09/22/2012  . Essential hypertension 09/22/2012  . Hyperlipidemia, mixed 09/22/2012  . History of breast cancer in female 04/28/2012    Orientation RESPIRATION BLADDER Height & Weight     Self, Time, Situation, Place  Normal Incontinent, Indwelling catheter Weight: 186 lb 4.6 oz (84.5 kg) Height:  5\' 8"  (172.7 cm)   BEHAVIORAL SYMPTOMS/MOOD NEUROLOGICAL BOWEL NUTRITION STATUS      Incontinent Diet(see discharge summary)  AMBULATORY STATUS COMMUNICATION OF NEEDS Skin   Extensive Assist Verbally Normal                       Personal Care Assistance Level of Assistance  Bathing, Feeding, Dressing Bathing Assistance: Maximum assistance Feeding assistance: Limited assistance Dressing Assistance: Maximum assistance     Functional Limitations Info  Sight, Hearing, Speech Sight Info: Adequate Hearing Info: Adequate Speech Info: Adequate    SPECIAL CARE FACTORS FREQUENCY  PT (By licensed PT), OT (By licensed OT)     PT Frequency: 5x week OT Frequency: 5x week            Contractures Contractures Info: Not present    Additional Factors Info  Code Status, Allergies Code Status Info: DNR Allergies Info: INFLUENZA VACCINES, TARKA TRANDOLAPRIL-VERAPAMIL HCL ER, ADHESIVE TAPE, AZITHROMYCIN, LIPITOR ATORVASTATIN, PENICILLINS            Current Medications (11/12/2017):  This is the current hospital active medication list Current Facility-Administered Medications  Medication Dose Route Frequency Provider Last Rate Last Dose  . 0.9 %  sodium chloride infusion   Intravenous Continuous Eugenie Filler, MD 75 mL/hr at 11/12/17 0321    . acetaminophen (TYLENOL) tablet 650 mg  650 mg Oral BID PRN Lady Deutscher, MD   650 mg at 11/10/17 1224  . albuterol (PROVENTIL) (2.5 MG/3ML) 0.083% nebulizer solution 3 mL  3 mL Inhalation Q4H PRN Lady Deutscher, MD      . ampicillin (OMNIPEN) 2 g in sodium chloride 0.9 % 100 mL IVPB  2 g Intravenous Q6H Dixon, Melton Krebs, NP 300 mL/hr at 11/12/17 1119 2 g at 11/12/17 1119  . apixaban (ELIQUIS) tablet 2.5 mg  2.5 mg Oral BID Lady Deutscher, MD   2.5 mg at 11/12/17 1118  . atenolol (TENORMIN) tablet 25 mg  25 mg Oral QHS Lady Deutscher, MD   25 mg at 11/11/17 2120  . calcium carbonate (OS-CAL - dosed in mg of elemental calcium) tablet 1,250  mg  1,250 mg Oral Daily Lady Deutscher, MD   1,250 mg at 11/12/17 1110  . cefTRIAXone (ROCEPHIN) 2 g in sodium chloride 0.9 % 100 mL IVPB  2 g Intravenous Q12H [place] Callas, NP 200 mL/hr at 11/12/17 0524 2 g at 11/12/17 0524  . fluticasone (FLONASE) 50 MCG/ACT nasal spray 1 spray  1 spray Each Nare QHS Lady Deutscher, MD   1 spray at 11/11/17 2124  . HYDROcodone-acetaminophen (NORCO/VICODIN) 5-325 MG per tablet 0.5 tablet  0.5 tablet Oral BID Lady Deutscher, MD   0.5 tablet at 11/12/17 1109  . ketorolac (TORADOL) 15 MG/ML injection 15 mg  15 mg Intravenous Q6H PRN Lady Deutscher, MD   15 mg at 11/08/17 2200  . loratadine (CLARITIN) tablet 10 mg  10 mg Oral Daily Lady Deutscher, MD   10 mg at 11/12/17 1109  . multivitamins with iron tablet 1 tablet  1 tablet Oral Daily Lady Deutscher, MD   1 tablet at 11/12/17 1110  . ondansetron (ZOFRAN) tablet 4 mg  4 mg Oral Q6H PRN Lady Deutscher, MD      . polyethylene glycol (MIRALAX / GLYCOLAX) packet 17 g  17 g Oral Daily PRN Lady Deutscher, MD      . psyllium (HYDROCIL/METAMUCIL) packet 1 packet  1 packet Oral Daily Lady Deutscher, MD   1 packet at 11/12/17 1110  . risperiDONE (RISPERDAL M-TABS) disintegrating tablet 0.5 mg  0.5 mg Oral QHS PRN Eugenie Filler, MD   0.5 mg at 11/10/17 2128  . senna (SENOKOT) tablet 8.6 mg  1 tablet Oral Once per day on Mon Wed Fri Lady Deutscher, MD   8.6 mg at 11/11/17 1000  . sertraline (ZOLOFT) tablet 25 mg  25 mg Oral QHS Lady Deutscher, MD   25 mg at 11/11/17 2120  . verapamil (CALAN-SR) CR tablet 240 mg  240 mg Oral QHS Lady Deutscher, MD   240 mg at 11/11/17 2119     Discharge Medications: Please see discharge summary for a list of discharge medications.  Relevant Imaging Results:  Relevant Lab Results:   Additional Information SS# [place], [place]

## 2017-11-12 NOTE — Progress Notes (Signed)
Physical Therapy Treatment Patient Details Name: Alexandra Luna MRN: [id number] DOB: [birth year] Today's Date: 11/12/2017    History of Present Illness 84 year old female history of hemorrhoids, anxiety, A. fib on chronic anticoagulation with Eliquis, history of remote breast cancer, renovascular hypertension, diabetes, dementia presents to the ED from [place] secondary to worsening confusion.  Patient recently treated for cellulitis of the lower extremity.    PT Comments    Pt participatory with therapy but confused and agitated. Pt with L hip pain with attempted rolling as well as 10/10 L hip pain whenever she stepped her R foot in standing. RN notified. X-ray for L hip would be helpful to r/u acute pathology for progression of mobility. Max A +2 for SPT to chair with RW. PT will continue to follow.     Follow Up Recommendations  Supervision/Assistance - 24 hour;SNF     Equipment Recommendations  None recommended by PT    Recommendations for Other Services OT consult     Precautions / Restrictions Precautions Precautions: Fall Restrictions Weight Bearing Restrictions: No    Mobility  Bed Mobility Overal bed mobility: Needs Assistance Bed Mobility: Supine to Sit     Supine to sit: Max assist;+2 for physical assistance     General bed mobility comments: attempted roll to L but was very painful to pt's L hip, pivoted to L side of bed with max A +2 and pt crying out with L hip pain. Noted small L hip posterior bruise but not to account for level of pain  Transfers Overall transfer level: Needs assistance Equipment used: Rolling walker (2 wheeled) Transfers: Sit to/from [facility] Sit to Stand: +2 physical assistance;Max assist Stand pivot transfers: +2 physical assistance;Max assist       General transfer comment: pt cried out every time she lifted R foot and wt went fully onto LLE. Took pivot steps to chair with max A for mvmt of RW and  bringing hips to L.   Ambulation/Gait             General Gait Details: unable due to AMS and L hip pain   Stairs             Wheelchair Mobility    Modified Rankin (Stroke Patients Only)       Balance Overall balance assessment: Needs assistance Sitting-balance support: Feet supported;Single extremity supported Sitting balance-Leahy Scale: Poor Sitting balance - Comments: needed min A to maintain balance   Standing balance support: Bilateral upper extremity supported Standing balance-Leahy Scale: Zero Standing balance comment: max A to stand                            Cognition Arousal/Alertness: Awake/alert Behavior During Therapy: Flat affect Overall Cognitive Status: No family/caregiver present to determine baseline cognitive functioning                                 General Comments: pt follows one step commands but is distracted easily      Exercises      General Comments General comments (skin integrity, edema, etc.): discussed L hip pain with RN after session, did not find recent x-ray of L hip      Pertinent Vitals/Pain Pain Assessment: Faces Faces Pain Scale: Hurts worst Pain Location: L LE Pain Descriptors / Indicators: Discomfort;Constant;Moaning Pain Intervention(s): Limited activity within patient's tolerance;Monitored during session;Patient  requesting pain meds-RN notified    Home Living                      Prior Function            PT Goals (current goals can now be found in the care plan section) Acute Rehab PT Goals Patient Stated Goal: feel better PT Goal Formulation: Patient unable to participate in goal setting Time For Goal Achievement: 11/24/17 Potential to Achieve Goals: Good Progress towards PT goals: Progressing toward goals    Frequency    Min 3X/week      PT Plan Discharge plan needs to be updated    Co-evaluation              AM-PAC PT "6 Clicks" Daily  Activity  Outcome Measure  Difficulty turning over in bed (including adjusting bedclothes, sheets and blankets)?: Unable Difficulty moving from lying on back to sitting on the side of the bed? : Unable Difficulty sitting down on and standing up from a chair with arms (e.g., wheelchair, bedside commode, etc,.)?: Unable Help needed moving to and from a bed to chair (including a wheelchair)?: A Lot Help needed walking in hospital room?: Total Help needed climbing 3-5 steps with a railing? : Total 6 Click Score: 7    End of Session Equipment Utilized During Treatment: Gait belt Activity Tolerance: Patient limited by pain Patient left: with call bell/phone within reach;in chair;with chair alarm set Nurse Communication: Mobility status PT Visit Diagnosis: Unsteadiness on feet (R26.81);Other abnormalities of gait and mobility (R26.89);Muscle weakness (generalized) (M62.81);History of falling (Z91.81)     Time: 3748-2707 PT Time Calculation (min) (ACUTE ONLY): 21 min  Charges:  $Therapeutic Activity: 8-22 mins                     Leighton Roach, PT  Acute Rehab Services  [place] 11/12/2017, 1:22 PM

## 2017-11-12 NOTE — Progress Notes (Signed)
PROGRESS NOTE    Alexandra Luna  ZYS:[id number] DOB: [birth year] DOA: 11/08/2017 PCP: Reymundo Poll, MD    Brief Narrative Patient is a 84 year old female history of hemorrhoids, anxiety, A. fib on chronic anticoagulation with Eliquis, history of remote breast cancer, renovascular hypertension, diabetes, dementia presents to the ED from skilled nursing facility secondary to worsening confusion.  Patient recently treated for cellulitis of the lower extremity.   Assessment & Plan:   Principal Problem:   Acute metabolic encephalopathy Active Problems:   Enterococcal bacteremia   History of breast cancer in female   AF (atrial fibrillation) (HCC)   Essential hypertension   Chronic anticoagulation   Generalized weakness   Blood in stool   Elevated temperature   Leukocytosis   Metabolic encephalopathy   Palliative care encounter   Dementia without behavioral disturbance  #1 acute metabolic encephalopathy Likely secondary to bacteremia in the setting of dehydration.  Patient presented from nursing facility with worsening confusion after recently being treated for cellulitis.  Patient with a baseline dementia and baseline unknown.  Patient's mental status is slowly improving however not at baseline.  Patient following commands.  Patient supposedly had received a dose of Ativan the night of 11/08/2017.  Chest x-ray negative.  Urinalysis with nitrite negative leukocytes negative.  RPR is nonreactive.  HIV nonreactive.  Vitamin B12 levels at 792.  RBC folate pending.  TSH at 3.236.  CT head negative for any acute abnormalities.  Blood cultures positive for gram-positive bacteremia/Enterococcus faecalis bacteremia.  Continue empiric IV vancomycin.  Gentle hydration.  Supportive care. Follow.  2.  Gram-positive bacteremia/Enterococcus faecalis bacteremia Noted on blood cultures x2.  BCID positive for enterococcus species.  2D echo with a EF of 60 to 65%, mild mitral valvular regurgitation, right atrium  severely dilated, right ventricle dilated and systolic function appears at least moderately depressed.  Could not fully evaluate function as exam ended early per patient request.  Repeat blood cultures have been drawn.  Continue empiric IV vancomycin while sensitivities pending.  Per ID no need for TEE and recommending 2 weeks of antibiotics with ampicillin provided repeat blood cultures are negative.  ID following and I appreciate the input and recommendations.  3.??  Rectal bleeding Per admitting physician patient with a long history of blood in his stool.  Patient is DNR.  H&H currently stable.  No overt bleeding noted.  Patient also with a history of hemorrhoids.  Patient on anticoagulation with Eliquis.  Will follow for now.  4.  Leukocytosis Leukocytosis likely secondary to bacteremia noted on blood cultures which were drawn.  Chest x-ray negative.  Urinalysis nitrite negative leukocytes negative.  Leukocytosis trending down.  Continue IV vancomycin.    5.  Acute kidney injury Patient noted to have a creatinine of 1.50 from 1.01 on admission.  Patient looked clinically dry on examination.  Likely secondary to a prerenal azotemia in the setting of diuretics.  Discontinued diuretics.  Urinalysis with 30 protein nitrite negative leukocytes negative.  Urine sodium of 19.  Renal function trending down with gentle hydration.  Decrease IV fluids to 50 cc/h.  Follow.   6.  Atrial fibrillation Continue atenolol for rate control.  Eliquis for anticoagulation.  Follow.    7.  Generalized weakness Questionable etiology.  PT/OT.  8. Left hip pain Check plain films of the left hip and pelvis.  9.  Mild right upper quadrant tenderness Improved today.  Check abdominal ultrasound.    DVT prophylaxis: Eliquis Code Status: DNR Family  Communication: No family at bedside. Disposition Plan: To be determined.  Likely back to skilled nursing facility once medically stable and back at  baseline.   Consultants:   Palliative care: Altha Harm, NP 11/09/2017  ID: Dr. Johnnye Sima 11/11/2017  Procedures:   Chest x-ray 11/09/2017, 11/08/2017  Renal ultrasound 11/09/2017   2D echo 11/11/2017  Antimicrobials:   IV vancomycin 11/10/2017   Subjective: Patient sitting up.  Denies any chest pain.  No shortness of breath.  Confusion improving.   Objective: Vitals:   11/11/17 1429 11/11/17 2220 11/12/17 0533 11/12/17 1101  BP: (!) 125/93 (!) 107/58 107/60 113/72  Pulse: 65 75 96 (!) 58  Resp:  16 16 14   Temp: 98.5 F (36.9 C) 98 F (36.7 C) 98.2 F (36.8 C) 98.1 F (36.7 C)  TempSrc: Oral Oral Oral Oral  SpO2: 98% 100% 99%   Weight:    84.5 kg  Height:        Intake/Output Summary (Last 24 hours) at 11/12/2017 1212 Last data filed at 11/12/2017 0533 Gross per 24 hour  Intake 1835.93 ml  Output 600 ml  Net 1235.93 ml   Filed Weights   11/10/17 0500 11/11/17 0413 11/12/17 1101  Weight: 79.5 kg 79.1 kg 84.5 kg    Examination:  General exam: Alert.   Respiratory system: Lungs clear to auscultation bilaterally.  No wheezes, no crackles, no rhonchi. Respiratory effort normal. Cardiovascular system: Irregularly irregular. No JVD.  No lower extremity edema.  Gastrointestinal system: Abdomen is soft, nontender, nondistended, positive bowel sounds.  No rebound.  No guarding.  Central nervous system: Alert.  Moving extremities spontaneously.  Extremities: Symmetric 5 x 5 power. Skin: No rashes, lesions or ulcers Psychiatry: Judgement and insight appear poor-fair. Mood & affect appropriate.     Data Reviewed: I have personally reviewed following labs and imaging studies  CBC: Recent Labs  Lab 11/08/17 0957 11/09/17 0503 11/09/17 1535 11/10/17 0532 11/11/17 0509 11/12/17 0602  WBC 14.7* 13.9*  --  12.3* 10.6* 10.6*  NEUTROABS 12.8*  --   --  9.9* 8.1*  --   HGB 12.7 11.7*  --  11.0* 10.0* 10.4*  HCT 39.7 36.0 35.2 33.5* 31.3* 33.3*  MCV 93.2 93.3  --   91.3 94.8 95.7  PLT 221 182  --  194 190 099   Basic Metabolic Panel: Recent Labs  Lab 11/08/17 0957 11/09/17 0503 11/10/17 0532 11/11/17 0509 11/12/17 0602  NA 133* 133* 134* 133* 135  K 4.3 4.8 4.2 3.9 4.5  CL 97* 99 100 103 104  CO2 22 21* 23 20* 20*  GLUCOSE 115* 106* 97 102* 95  BUN 26* 37* 52* 42* 36*  CREATININE 1.01* 1.50* 1.43* 1.00 0.97  CALCIUM 10.1 9.6 9.4 8.4* 8.8*   GFR: Estimated Creatinine Clearance: 45.6 mL/min (by C-G formula based on SCr of 0.97 mg/dL). Liver Function Tests: Recent Labs  Lab 11/08/17 0957  AST 32  ALT 24  ALKPHOS 89  BILITOT 1.6*  PROT 6.7  ALBUMIN 3.3*   No results for input(s): LIPASE, AMYLASE in the last 168 hours. No results for input(s): AMMONIA in the last 168 hours. Coagulation Profile: No results for input(s): INR, PROTIME in the last 168 hours. Cardiac Enzymes: Recent Labs  Lab 11/08/17 0957 11/08/17 1457 11/08/17 2046 11/09/17 0503  TROPONINI 0.04* 0.05* 0.04* 0.05*   BNP (last 3 results) No results for input(s): PROBNP in the last 8760 hours. HbA1C: No results for input(s): HGBA1C in the last 72 hours.  CBG: No results for input(s): GLUCAP in the last 168 hours. Lipid Profile: No results for input(s): CHOL, HDL, LDLCALC, TRIG, CHOLHDL, LDLDIRECT in the last 72 hours. Thyroid Function Tests: Recent Labs    11/09/17 1524  TSH 3.236   Anemia Panel: Recent Labs    11/09/17 1524  VITAMINB12 792   Sepsis Labs: No results for input(s): PROCALCITON, LATICACIDVEN in the last 168 hours.  Recent Results (from the past 240 hour(s))  Urine culture     Status: None   Collection Time: 11/02/17  4:02 PM  Result Value Ref Range Status   Specimen Description URINE, CATHETERIZED  Final   Special Requests NONE  Final   Culture   Final    NO GROWTH Performed at [facility], [address] [place], [place] [address]    Report Status 11/03/2017 FINAL  Final  MRSA PCR Screening     Status: Abnormal    Collection Time: 11/08/17  3:54 PM  Result Value Ref Range Status   MRSA by PCR POSITIVE (A) NEGATIVE Final    Comment:        The GeneXpert MRSA Assay (FDA approved for NASAL specimens only), is one component of a comprehensive MRSA colonization surveillance program. It is not intended to diagnose MRSA infection nor to guide or monitor treatment for MRSA infections. RESULT CALLED TO, READ BACK BY AND VERIFIED WITH: Marjory Sneddon RN 11/08/17 1838 JDW Performed at [facility], [place] [address] [place], [place] [address]   Urine Culture     Status: None   Collection Time: 11/09/17  4:35 PM  Result Value Ref Range Status   Specimen Description URINE, CATHETERIZED  Final   Special Requests NONE  Final   Culture   Final    NO GROWTH Performed at [facility], [address], [place], [place] [address]    Report Status 11/10/2017 FINAL  Final  Culture, blood (Routine X 2) w Reflex to ID Panel     Status: Abnormal   Collection Time: 11/09/17  6:42 PM  Result Value Ref Range Status   Specimen Description BLOOD LEFT ANTECUBITAL  Final   Special Requests   Final    BOTTLES DRAWN AEROBIC ONLY Blood Culture adequate volume   Culture  Setup Time   Final    GRAM POSITIVE COCCI IN PAIRS AND CHAINS AEROBIC BOTTLE ONLY CRITICAL RESULT CALLED TO, READ BACK BY AND VERIFIED WITH: R. RUMBARGER, RPHARMD AT 1250 ON 11/10/17 BY C. JESSUP, MLT. Performed at [facility], [place] [address] [place], [place] [address]    Culture ENTEROCOCCUS FAECALIS (A)  Final   Report Status 11/12/2017 FINAL  Final   Organism ID, Bacteria ENTEROCOCCUS FAECALIS  Final      Susceptibility   Enterococcus faecalis - MIC*    AMPICILLIN <=2 SENSITIVE Sensitive     VANCOMYCIN 1 SENSITIVE Sensitive     GENTAMICIN SYNERGY SENSITIVE Sensitive     * ENTEROCOCCUS FAECALIS  Blood Culture ID Panel (Reflexed)     Status: Abnormal   Collection Time: 11/09/17  6:42 PM  Result Value Ref Range Status   Enterococcus  species DETECTED (A) NOT DETECTED Final    Comment: CRITICAL RESULT CALLED TO, READ BACK BY AND VERIFIED WITH: R. RUMBARGER, RPHARMD AT 1250 ON 11/10/17 BY C. JESSUP, MLT.    Vancomycin resistance NOT DETECTED NOT DETECTED Final   Listeria monocytogenes NOT DETECTED NOT DETECTED Final   Staphylococcus species NOT DETECTED NOT  DETECTED Final   Staphylococcus aureus NOT DETECTED NOT DETECTED Final   Streptococcus species NOT DETECTED NOT DETECTED Final   Streptococcus agalactiae NOT DETECTED NOT DETECTED Final   Streptococcus pneumoniae NOT DETECTED NOT DETECTED Final   Streptococcus pyogenes NOT DETECTED NOT DETECTED Final   Acinetobacter baumannii NOT DETECTED NOT DETECTED Final   Enterobacteriaceae species NOT DETECTED NOT DETECTED Final   Enterobacter cloacae complex NOT DETECTED NOT DETECTED Final   Escherichia coli NOT DETECTED NOT DETECTED Final   Klebsiella oxytoca NOT DETECTED NOT DETECTED Final   Klebsiella pneumoniae NOT DETECTED NOT DETECTED Final   Proteus species NOT DETECTED NOT DETECTED Final   Serratia marcescens NOT DETECTED NOT DETECTED Final   Haemophilus influenzae NOT DETECTED NOT DETECTED Final   Neisseria meningitidis NOT DETECTED NOT DETECTED Final   Pseudomonas aeruginosa NOT DETECTED NOT DETECTED Final   Candida albicans NOT DETECTED NOT DETECTED Final   Candida glabrata NOT DETECTED NOT DETECTED Final   Candida krusei NOT DETECTED NOT DETECTED Final   Candida parapsilosis NOT DETECTED NOT DETECTED Final   Candida tropicalis NOT DETECTED NOT DETECTED Final    Comment: Performed at [facility], [place] [address], [place], [place] [address]  Culture, blood (Routine X 2) w Reflex to ID Panel     Status: Abnormal   Collection Time: 11/09/17  6:50 PM  Result Value Ref Range Status   Specimen Description BLOOD RIGHT HAND  Final   Special Requests   Final    BOTTLES DRAWN AEROBIC AND ANAEROBIC Blood Culture results may not be optimal due to an inadequate volume  of blood received in culture bottles   Culture  Setup Time   Final    GRAM POSITIVE COCCI AEROBIC BOTTLE ONLY CRITICAL RESULT CALLED TO, READ BACK BY AND VERIFIED WITH: R. RUMBARGER, RPHARMD AT 1250 ON 11/10/17 BY C. JESSUP, MLT.    Culture (A)  Final    ENTEROCOCCUS FAECALIS SUSCEPTIBILITIES PERFORMED ON PREVIOUS CULTURE WITHIN THE LAST 5 DAYS. Performed at [facility], [place] [address], [place], [place] [address]    Report Status 11/12/2017 FINAL  Final         Radiology Studies: No results found.      Scheduled Meds: . apixaban  2.5 mg Oral BID  . atenolol  25 mg Oral QHS  . calcium carbonate  1,250 mg Oral Daily  . fluticasone  1 spray Each Nare QHS  . HYDROcodone-acetaminophen  0.5 tablet Oral BID  . loratadine  10 mg Oral Daily  . multivitamins with iron  1 tablet Oral Daily  . psyllium  1 packet Oral Daily  . senna  1 tablet Oral Once per day on Mon Wed Fri  . sertraline  25 mg Oral QHS  . verapamil  240 mg Oral QHS   Continuous Infusions: . sodium chloride 75 mL/hr at 11/12/17 0321  . ampicillin (OMNIPEN) IV 2 g (11/12/17 1119)  . cefTRIAXone (ROCEPHIN)  IV 2 g (11/12/17 0524)     LOS: 1 day    Time spent: 35 minutes    Irine Seal, MD [facility] Pager [phone]  If 7PM-7AM, please contact night-coverage [email or web address] Password [email or web address] 11/12/2017, 12:12 PM

## 2017-11-12 NOTE — Clinical Social Work Note (Signed)
Clinical Social Work Assessment  Patient Details  Name: Alexandra Luna MRN: [id number] Date of Birth: Aug 04, 1926  Date of referral:  11/12/17               Reason for consult:  Facility Placement, Discharge Planning                Permission sought to share information with:  Facility Sport and exercise psychologist, Family Supports Permission granted to share information::  No(pt only oriented to self)  Name::     Frederica Kuster  Agency::  [facility]  Relationship::  daughter  Sport and exercise psychologist Information:     Housing/Transportation Living arrangements for the past 2 months:  [place], [place] of Information:  Adult Children Patient Interpreter Needed:  None Criminal Activity/Legal Involvement Pertinent to Current Situation/Hospitalization:  No - Comment as needed Significant Relationships:  Adult Children Lives with:  Facility Resident Do you feel safe going back to the place where you live?  Yes Need for family participation in patient care:  Yes (Comment)  Care giving concerns:  Pt is from [facility]. Pt daughter would like for pt to return, but understands that [facility] may not be able to handle iv abx. Pt daughter amenable to SNF.    Social Worker assessment / plan: CSW spoke with pt daughter on the telephone. Pt daughter states that pt has lived at [facility] for a few years, prior to moving to [facility] pt had lived independently and then stayed briefly at [facility]. CSW explained role and went over SNF process with pt daughter and explained why it was recommended. Pt daughter amenable to SNF for iv abx and therapies prior to returning to [facility]. Pt daughter available to review offers when received.   Employment status:  Retired Nurse, adult PT Recommendations:  Home with [place], [place] / Referral to community resources:  [place]  Patient/Family's Response to care:  Pt daughter amenable to SNF recommendation and CSW support.   Patient/Family's Understanding of and Emotional Response to Diagnosis, Current Treatment, and Prognosis:  Pt daughter states understanding of diagnosis, current treatment and prognosis. Pt daughter states reasonable expectations for pt progress moving forward. Pt daughter sounds supportive and knowledgeable about pt care. Pt daughter happy with care at hospital and with care at [facility], she would like for pt to return there when SNF stay complete.   Emotional Assessment Appearance:  Appears stated age Attitude/Demeanor/Rapport:  Unable to Assess Affect (typically observed):  Unable to Assess Orientation:  Oriented to Self, Fluctuating Orientation (Suspected and/or reported Sundowners) Alcohol / Substance use:  Not Applicable Psych involvement (Current and /or in the community):  No (Comment)  Discharge Needs  Concerns to be addressed:  Care Coordination, Discharge Planning Concerns Readmission within the last 30 days:  No Current discharge risk:  Cognitively Impaired, Dependent with Mobility, Physical Impairment Barriers to Discharge:  Ship broker, Continued Medical Work up   [employer], [place] 11/12/2017, 1:21 PM

## 2017-11-12 NOTE — Social Work (Signed)
HIPPA compliant message left for daughter.   1:10pm- CSW spoke with pt daughter Manuela Schwartz via telephone, see full assessment note. Pt daughter states understanding that [facility] may not be able to manage pt iv abx. Would like SNF offers to review if that is the case.   Alexander [place], [place] Work [phone]

## 2017-11-12 NOTE — Progress Notes (Signed)
INFECTIOUS DISEASE PROGRESS NOTE  ID: Alexandra Luna is a 84 y.o. female with  Principal Problem:   Acute metabolic encephalopathy Active Problems:   History of breast cancer in female   AF (atrial fibrillation) (HCC)   Essential hypertension   Chronic anticoagulation   Generalized weakness   Blood in stool   Elevated temperature   Leukocytosis   Metabolic encephalopathy   Palliative care encounter   Enterococcal bacteremia   Dementia without behavioral disturbance  Subjective: No complaints  Abtx:  Anti-infectives (From admission, onward)   Start     Dose/Rate Route Frequency Ordered Stop   11/11/17 1800  cefTRIAXone (ROCEPHIN) 2 g in sodium chloride 0.9 % 100 mL IVPB  Status:  Discontinued     2 g 200 mL/hr over 30 Minutes Intravenous Every 12 hours 11/11/17 1548 11/12/17 1604   11/11/17 1600  ampicillin (OMNIPEN) 2 g in sodium chloride 0.9 % 100 mL IVPB     2 g 300 mL/hr over 20 Minutes Intravenous Every 6 hours 11/11/17 1548     11/10/17 1400  vancomycin (VANCOCIN) IVPB 1000 mg/200 mL premix  Status:  Discontinued     1,000 mg 200 mL/hr over 60 Minutes Intravenous Every 24 hours 11/10/17 1306 11/11/17 1548   11/08/17 1445  cephALEXin (KEFLEX) capsule 500 mg     500 mg Oral 2 times daily 11/08/17 1439 11/09/17 0959      Medications:  Scheduled: . apixaban  2.5 mg Oral BID  . atenolol  25 mg Oral QHS  . calcium carbonate  1,250 mg Oral Daily  . fluticasone  1 spray Each Nare QHS  . HYDROcodone-acetaminophen  0.5 tablet Oral BID  . loratadine  10 mg Oral Daily  . multivitamins with iron  1 tablet Oral Daily  . psyllium  1 packet Oral Daily  . senna  1 tablet Oral Once per day on Mon Wed Fri  . sertraline  25 mg Oral QHS  . verapamil  240 mg Oral QHS    Objective: Vital signs in last 24 hours: Temp:  [98 F (36.7 C)-98.2 F (36.8 C)] 98.1 F (36.7 C) (08/13 1101) Pulse Rate:  [58-96] 58 (08/13 1101) Resp:  [14-16] 14 (08/13 1101) BP: (107-113)/(58-72)  113/72 (08/13 1101) SpO2:  [99 %-100 %] 99 % (08/13 0533) Weight:  [84.5 kg] 84.5 kg (08/13 1101)   General appearance: alert, cooperative, appears stated age and no distress Resp: clear to auscultation bilaterally Cardio: regular rate and rhythm GI: normal findings: bowel sounds normal and soft, non-tender Neurologic: Mental status: alertness: alert, orientation: date, person, place  Lab Results Recent Labs    11/11/17 0509 11/12/17 0602  WBC 10.6* 10.6*  HGB 10.0* 10.4*  HCT 31.3* 33.3*  NA 133* 135  K 3.9 4.5  CL 103 104  CO2 20* 20*  BUN 42* 36*  CREATININE 1.00 0.97   Liver Panel No results for input(s): PROT, ALBUMIN, AST, ALT, ALKPHOS, BILITOT, BILIDIR, IBILI in the last 72 hours. Sedimentation Rate No results for input(s): ESRSEDRATE in the last 72 hours. C-Reactive Protein No results for input(s): CRP in the last 72 hours.  Microbiology: Recent Results (from the past 240 hour(s))  MRSA PCR Screening     Status: Abnormal   Collection Time: 11/08/17  3:54 PM  Result Value Ref Range Status   MRSA by PCR POSITIVE (A) NEGATIVE Final    Comment:        The GeneXpert MRSA Assay (FDA approved for  NASAL specimens only), is one component of a comprehensive MRSA colonization surveillance program. It is not intended to diagnose MRSA infection nor to guide or monitor treatment for MRSA infections. RESULT CALLED TO, READ BACK BY AND VERIFIED WITH: Marjory Sneddon RN 11/08/17 1838 JDW Performed at [facility], [place] [address] [place], [place] [address]   Urine Culture     Status: None   Collection Time: 11/09/17  4:35 PM  Result Value Ref Range Status   Specimen Description URINE, CATHETERIZED  Final   Special Requests NONE  Final   Culture   Final    NO GROWTH Performed at [facility], [address], [place], [place] [address]    Report Status 11/10/2017 FINAL  Final  Culture, blood (Routine X 2) w Reflex to ID Panel     Status: Abnormal   Collection  Time: 11/09/17  6:42 PM  Result Value Ref Range Status   Specimen Description BLOOD LEFT ANTECUBITAL  Final   Special Requests   Final    BOTTLES DRAWN AEROBIC ONLY Blood Culture adequate volume   Culture  Setup Time   Final    GRAM POSITIVE COCCI IN PAIRS AND CHAINS AEROBIC BOTTLE ONLY CRITICAL RESULT CALLED TO, READ BACK BY AND VERIFIED WITH: R. RUMBARGER, RPHARMD AT 1250 ON 11/10/17 BY C. JESSUP, MLT. Performed at [facility], [place] [address] [place], [place] [address]    Culture ENTEROCOCCUS FAECALIS (A)  Final   Report Status 11/12/2017 FINAL  Final   Organism ID, Bacteria ENTEROCOCCUS FAECALIS  Final      Susceptibility   Enterococcus faecalis - MIC*    AMPICILLIN <=2 SENSITIVE Sensitive     VANCOMYCIN 1 SENSITIVE Sensitive     GENTAMICIN SYNERGY SENSITIVE Sensitive     * ENTEROCOCCUS FAECALIS  Blood Culture ID Panel (Reflexed)     Status: Abnormal   Collection Time: 11/09/17  6:42 PM  Result Value Ref Range Status   Enterococcus species DETECTED (A) NOT DETECTED Final    Comment: CRITICAL RESULT CALLED TO, READ BACK BY AND VERIFIED WITH: R. RUMBARGER, RPHARMD AT 1250 ON 11/10/17 BY C. JESSUP, MLT.    Vancomycin resistance NOT DETECTED NOT DETECTED Final   Listeria monocytogenes NOT DETECTED NOT DETECTED Final   Staphylococcus species NOT DETECTED NOT DETECTED Final   Staphylococcus aureus NOT DETECTED NOT DETECTED Final   Streptococcus species NOT DETECTED NOT DETECTED Final   Streptococcus agalactiae NOT DETECTED NOT DETECTED Final   Streptococcus pneumoniae NOT DETECTED NOT DETECTED Final   Streptococcus pyogenes NOT DETECTED NOT DETECTED Final   Acinetobacter baumannii NOT DETECTED NOT DETECTED Final   Enterobacteriaceae species NOT DETECTED NOT DETECTED Final   Enterobacter cloacae complex NOT DETECTED NOT DETECTED Final   Escherichia coli NOT DETECTED NOT DETECTED Final   Klebsiella oxytoca NOT DETECTED NOT DETECTED Final   Klebsiella pneumoniae NOT DETECTED  NOT DETECTED Final   Proteus species NOT DETECTED NOT DETECTED Final   Serratia marcescens NOT DETECTED NOT DETECTED Final   Haemophilus influenzae NOT DETECTED NOT DETECTED Final   Neisseria meningitidis NOT DETECTED NOT DETECTED Final   Pseudomonas aeruginosa NOT DETECTED NOT DETECTED Final   Candida albicans NOT DETECTED NOT DETECTED Final   Candida glabrata NOT DETECTED NOT DETECTED Final   Candida krusei NOT DETECTED NOT DETECTED Final   Candida parapsilosis NOT DETECTED NOT DETECTED Final   Candida tropicalis NOT DETECTED NOT DETECTED Final    Comment: Performed at [facility]  [facility], [place] [address] [place], [place] [address]  Culture, blood (Routine X 2) w Reflex to ID Panel     Status: Abnormal   Collection Time: 11/09/17  6:50 PM  Result Value Ref Range Status   Specimen Description BLOOD RIGHT HAND  Final   Special Requests   Final    BOTTLES DRAWN AEROBIC AND ANAEROBIC Blood Culture results may not be optimal due to an inadequate volume of blood received in culture bottles   Culture  Setup Time   Final    GRAM POSITIVE COCCI AEROBIC BOTTLE ONLY CRITICAL RESULT CALLED TO, READ BACK BY AND VERIFIED WITH: R. RUMBARGER, RPHARMD AT 1250 ON 11/10/17 BY C. JESSUP, MLT.    Culture (A)  Final    ENTEROCOCCUS FAECALIS SUSCEPTIBILITIES PERFORMED ON PREVIOUS CULTURE WITHIN THE LAST 5 DAYS. Performed at [facility], [place] [address] [place], [place] [address]    Report Status 11/12/2017 FINAL  Final    Studies/Results: US Abdomen Complete  Result Date: 11/12/2017 CLINICAL DATA:  Abdominal pain EXAM: ABDOMEN ULTRASOUND COMPLETE COMPARISON:  CT scan 03/20/2015 FINDINGS: Gallbladder: No gallstones or gallbladder wall thickening. Sludge noted in the lumen. No pericholecystic fluid. The sonographer reports no sonographic Murphy's sign. Common bile duct: Diameter: 3 4 mm Liver: No focal lesion identified. Within normal limits in parenchymal echogenicity. Portal vein is patent on color  Doppler imaging with normal direction of blood flow towards the liver. IVC: No abnormality visualized. Pancreas: Visualized portion unremarkable. Spleen: Size and appearance within normal limits. Right Kidney: Length: 11.7 cm. Echogenicity within normal limits. No mass or hydronephrosis visualized. Left Kidney: Length: 11.9 cm. Echogenicity within normal limits. No mass or hydronephrosis visualized. Abdominal aorta: No aneurysm visualized. Other findings: None. IMPRESSION: No sonographic findings to explain the patient's history of pain. Electronically Signed   By: Misty Stanley M.D.   On: 11/12/2017 12:49   Dg Hip Unilat With Pelvis 2-3 Views Left  Result Date: 11/12/2017 CLINICAL DATA:  Pt c/o posterior left hip pain; no known injury to the area. No hx of prior injuries or surgeries. EXAM: DG HIP (WITH OR WITHOUT PELVIS) 2-3V LEFT COMPARISON:  None. FINDINGS: There are acute appearing fractures the left superior pubic ramus where it joins the pubic symphysis, in the inferior left pubic ramus. The superior fracture is mildly comminuted. There is no significant displacement. No other convincing fractures.  No bone lesions. There is concentric left hip joint space narrowing. Small marginal osteophytes project from the base of the left femoral head. Right hip joint is normally spaced and aligned. SI joints and symphysis pubis are normally aligned. Bones are demineralized. Surrounding soft tissues are unremarkable. IMPRESSION: 1. Acute fractures of the left superior and inferior pubic rami, the superior fracture extending to the pubic symphysis. No significant displacement. Electronically Signed   By: Lajean Manes M.D.   On: 11/12/2017 15:20     Assessment/Plan: Confusion Enterococcal bacteremia  Total days of antibiotics: 3 (vanco ---> amp)  She appears to be improving.  would continue her on ampicillin for 2 weeks.  Please let us know if needed further         Bobby Rumpf MD, FACP Infectious  Diseases (pager) [phone] [email or web address] 11/12/2017, 4:12 PM  LOS: 1 day

## 2017-11-13 DIAGNOSIS — G9341 Metabolic encephalopathy: Principal | ICD-10-CM

## 2017-11-13 DIAGNOSIS — R1011 Right upper quadrant pain: Secondary | ICD-10-CM

## 2017-11-13 DIAGNOSIS — I481 Persistent atrial fibrillation: Secondary | ICD-10-CM

## 2017-11-13 NOTE — Progress Notes (Signed)
Occupational Therapy Treatment Patient Details Name: Alexandra Luna MRN: [id number] DOB: 07-27-1926 Today's Date: 11/13/2017    History of present illness 84 year old female history of hemorrhoids, anxiety, A. fib on chronic anticoagulation with Eliquis, history of remote breast cancer, renovascular hypertension, diabetes, dementia presents to the ED from [place] secondary to worsening confusion.  Patient recently treated for cellulitis of the lower extremity.   OT comments  Patient progressing well.  Demonstrates ability to complete bed mobility with minA today (to manage L LE back into bed), sitting EOB with supervision while completing grooming and feeding tasks.  Pt with oxygen off upon therapist entry and therapist replaced during bed mobility as patient became SOB.  Updated goals, see POC.  Will continue to follow, DC recommendations remain appropriate.    Follow Up Recommendations  SNF;Supervision/Assistance - 24 hour    Equipment Recommendations  3 in 1 bedside commode;Wheelchair (measurements OT);Wheelchair cushion (measurements OT);Hospital bed    Recommendations for Other Services      Precautions / Restrictions Precautions Precautions: Fall Restrictions Weight Bearing Restrictions: No       Mobility Bed Mobility Overal bed mobility: Needs Assistance Bed Mobility: Supine to Sit;Sit to Supine     Supine to sit: Min guard;HOB elevated Sit to supine: Min assist;HOB elevated   General bed mobility comments: required A to manage L LE into bed, increased time and effort (scooting up in bed in trendelenburg with minA )  Transfers                      Balance Overall balance assessment: Needs assistance Sitting-balance support: No upper extremity supported;Feet supported Sitting balance-Leahy Scale: Fair Sitting balance - Comments: supervision for grooming seated unsupported within base of support   Standing balance support: (NA)                                ADL either performed or assessed with clinical judgement   ADL Overall ADL's : Needs assistance/impaired Eating/Feeding: Supervision/ safety;Sitting Eating/Feeding Details (indicate cue type and reason): pt able to prepare coffee seated EOB with supervision Grooming: Wash/dry hands;Wash/dry face;Oral care;Supervision/safety;Sitting;Set up Grooming Details (indicate cue type and reason): supervision seated EOB, sequencing tasks without cueing                                General ADL Comments: completed bed mobility and ADLs from EOB     Vision       Perception     Praxis      Cognition Arousal/Alertness: Awake/alert Behavior During Therapy: WFL for tasks assessed/performed Overall Cognitive Status: No family/caregiver present to determine baseline cognitive functioning Area of Impairment: Memory;Attention;Safety/judgement;Following commands;Awareness;Problem solving                   Current Attention Level: Sustained Memory: Decreased short-term memory Following Commands: Follows multi-step commands inconsistently;Follows multi-step commands with increased time Safety/Judgement: Decreased awareness of safety;Decreased awareness of deficits Awareness: Emergent Problem Solving: Slow processing;Requires verbal cues          Exercises     Shoulder Instructions       General Comments pt with oxygen off upon entry, RN aware reporting she removes oxygen herself    Pertinent Vitals/ Pain       Pain Assessment: No/denies pain  Home Living  Prior Functioning/Environment              Frequency  Min 2X/week        Progress Toward Goals  OT Goals(current goals can now be found in the care plan section)  Progress towards OT goals: Progressing toward goals;Goals met and updated - see care plan  Acute Rehab OT Goals Patient Stated Goal: feel better OT  Goal Formulation: With patient Time For Goal Achievement: 11/23/17 Potential to Achieve Goals: Good  Plan Discharge plan remains appropriate;Frequency remains appropriate    Co-evaluation                 AM-PAC PT "6 Clicks" Daily Activity     Outcome Measure   Help from another person eating meals?: None Help from another person taking care of personal grooming?: A Little Help from another person toileting, which includes using toliet, bedpan, or urinal?: A Lot Help from another person bathing (including washing, rinsing, drying)?: A Lot Help from another person to put on and taking off regular upper body clothing?: A Lot Help from another person to put on and taking off regular lower body clothing?: A Lot 6 Click Score: 15    End of Session Equipment Utilized During Treatment: Oxygen  OT Visit Diagnosis: Unsteadiness on feet (R26.81);Muscle weakness (generalized) (M62.81)   Activity Tolerance Patient tolerated treatment well   Patient Left in bed;with call bell/phone within reach;with bed alarm set   Nurse Communication Mobility status;Precautions        Time: 0092-3300 OT Time Calculation (min): 25 min  Charges: OT General Charges $OT Visit: 1 Visit OT Treatments $Self Care/Home Management : 23-37 mins  Delight Stare, OTR/L  Pager [place] 11/13/2017, 3:29 PM

## 2017-11-13 NOTE — Progress Notes (Signed)
PROGRESS NOTE        PATIENT DETAILS Name: Alexandra Luna Age: 84 y.o. Sex: female Date of Birth: Mar 27, 1927 Admit Date: 11/08/2017 Admitting Physician Lady Deutscher, MD [name], [name], MD  Brief Narrative: Patient is a 84 y.o. female with history of dementia, hypertension, DM-2, A. fib on anticoagulation with Eliquis presented to the hospital with worsening confusion (more than her usual baseline).  She was recently treated for cellulitis of her lower extremity.  Upon further evaluation, she was found to have enterococcal bacteremia.  See below for further details.  Subjective: Pleasantly confused this morning-answers some questions appropriately-no family at bedside but do not think she is that far from her usual baseline.  Assessment/Plan: Acute metabolic encephalopathy: Secondary to enterococcal bacteremia, upon review of the chart-it seems that her mentation has improved and I suspect she is close to usual baseline.  CT head on 8/10 was negative for acute abnormalities.  Enterococcal bacteremia: Continue ampicillin, transthoracic echocardiogram without any evidence of vegetation.  Await repeat blood cultures, if negative then will place PICC line and plan for IV antibiotics for 2 weeks from date of negative blood cultures.  ID input appreciated.  ?  Rectal bleeding: No overt rectal bleeding apparent-hemoglobin stable.  Since hemoglobin stable-patient with advanced dementia-unless patient develops overt bleeding-do not think any work-up is required at this time.  Acute kidney injury: Resolved with supportive care.  Atrial fibrillation: Continue beta-blocker and verapamil and Eliquis.  Acute fracture of the left superior and inferior pubic rami: Supportive care-discussed with orthopedics over the phone.Not sure if patient sustained a mechanical fall recently.  RUQ pain: Resolved.  Abdominal exam is benign.  Gallbladder ultrasound without any acute  abnormality.  Dementia: Pleasantly confused-at risk of delirium during this hospital stay.  See above regarding mental status.  Continue Zoloft.  DVT Prophylaxis: Full dose anticoagulation with Eliquis  Code Status:  DNR  Family Communication: None at bedside  Disposition Plan: Remain inpatient-SNF on discharge-later this week  Antimicrobial agents: Anti-infectives (From admission, onward)   Start     Dose/Rate Route Frequency Ordered Stop   11/11/17 1800  cefTRIAXone (ROCEPHIN) 2 g in sodium chloride 0.9 % 100 mL IVPB  Status:  Discontinued     2 g 200 mL/hr over 30 Minutes Intravenous Every 12 hours 11/11/17 1548 11/12/17 1604   11/11/17 1600  ampicillin (OMNIPEN) 2 g in sodium chloride 0.9 % 100 mL IVPB     2 g 300 mL/hr over 20 Minutes Intravenous Every 6 hours 11/11/17 1548     11/10/17 1400  vancomycin (VANCOCIN) IVPB 1000 mg/200 mL premix  Status:  Discontinued     1,000 mg 200 mL/hr over 60 Minutes Intravenous Every 24 hours 11/10/17 1306 11/11/17 1548   11/08/17 1445  cephALEXin (KEFLEX) capsule 500 mg     500 mg Oral 2 times daily 11/08/17 1439 11/09/17 0959      Procedures: None  CONSULTS:  ID  Time spent: 25- minutes-Greater than 50% of this time was spent in counseling, explanation of diagnosis, planning of further management, and coordination of care.  MEDICATIONS: Scheduled Meds: . apixaban  2.5 mg Oral BID  . atenolol  25 mg Oral QHS  . calcium carbonate  1,250 mg Oral Daily  . fluticasone  1 spray Each Nare QHS  . HYDROcodone-acetaminophen  0.5 tablet Oral BID  .  loratadine  10 mg Oral Daily  . multivitamins with iron  1 tablet Oral Daily  . psyllium  1 packet Oral Daily  . senna  1 tablet Oral Once per day on Mon Wed Fri  . sertraline  25 mg Oral QHS  . verapamil  240 mg Oral QHS   Continuous Infusions: . sodium chloride 50 mL/hr at 11/12/17 2132  . ampicillin (OMNIPEN) IV 2 g (11/13/17 1028)   PRN Meds:.acetaminophen, albuterol,  ketorolac, ondansetron, polyethylene glycol, risperiDONE   PHYSICAL EXAM: Vital signs: Vitals:   11/12/17 1700 11/12/17 2201 11/13/17 0035 11/13/17 0537  BP: 119/77 (!) 121/109 126/79 133/76  Pulse: 67 70 90 97  Resp: 17 18  16   Temp: 98 F (36.7 C) 97.9 F (36.6 C)  98.2 F (36.8 C)  TempSrc: Oral   Oral  SpO2: 100%   100%  Weight:    83.9 kg  Height:       Filed Weights   11/11/17 0413 11/12/17 1101 11/13/17 0537  Weight: 79.1 kg 84.5 kg 83.9 kg   Body mass index is 28.13 kg/m.   General appearance :Awake, pleasantly confused.  Not in distress. Eyes:Pink conjunctiva HEENT: Atraumatic and Normocephalic Neck: supple Resp:Good air entry bilaterally, no added sounds  CVS: S1 S2 irregular GI: Bowel sounds present, Non tender and not distended with no gaurding, rigidity or rebound.No organomegaly Extremities:non focal Neurology:  speech clear,Non focal, sensation is grossly intact. Musculoskeletal:No digital cyanosis Skin:No Rash, warm and dry Wounds:N/A  I have personally reviewed following labs and imaging studies  LABORATORY DATA: CBC: Recent Labs  Lab 11/08/17 0957 11/09/17 0503 11/09/17 1535 11/10/17 0532 11/11/17 0509 11/12/17 0602  WBC 14.7* 13.9*  --  12.3* 10.6* 10.6*  NEUTROABS 12.8*  --   --  9.9* 8.1*  --   HGB 12.7 11.7*  --  11.0* 10.0* 10.4*  HCT 39.7 36.0 35.2 33.5* 31.3* 33.3*  MCV 93.2 93.3  --  91.3 94.8 95.7  PLT 221 182  --  194 190 161    Basic Metabolic Panel: Recent Labs  Lab 11/08/17 0957 11/09/17 0503 11/10/17 0532 11/11/17 0509 11/12/17 0602  NA 133* 133* 134* 133* 135  K 4.3 4.8 4.2 3.9 4.5  CL 97* 99 100 103 104  CO2 22 21* 23 20* 20*  GLUCOSE 115* 106* 97 102* 95  BUN 26* 37* 52* 42* 36*  CREATININE 1.01* 1.50* 1.43* 1.00 0.97  CALCIUM 10.1 9.6 9.4 8.4* 8.8*    GFR: Estimated Creatinine Clearance: 45.5 mL/min (by C-G formula based on SCr of 0.97 mg/dL).  Liver Function Tests: Recent Labs  Lab 11/08/17 0957    AST 32  ALT 24  ALKPHOS 89  BILITOT 1.6*  PROT 6.7  ALBUMIN 3.3*   No results for input(s): LIPASE, AMYLASE in the last 168 hours. No results for input(s): AMMONIA in the last 168 hours.  Coagulation Profile: No results for input(s): INR, PROTIME in the last 168 hours.  Cardiac Enzymes: Recent Labs  Lab 11/08/17 0957 11/08/17 1457 11/08/17 2046 11/09/17 0503  TROPONINI 0.04* 0.05* 0.04* 0.05*    BNP (last 3 results) No results for input(s): PROBNP in the last 8760 hours.  HbA1C: No results for input(s): HGBA1C in the last 72 hours.  CBG: No results for input(s): GLUCAP in the last 168 hours.  Lipid Profile: No results for input(s): CHOL, HDL, LDLCALC, TRIG, CHOLHDL, LDLDIRECT in the last 72 hours.  Thyroid Function Tests: No results for input(s): TSH, T4TOTAL,  FREET4, T3FREE, THYROIDAB in the last 72 hours.  Anemia Panel: No results for input(s): VITAMINB12, FOLATE, FERRITIN, TIBC, IRON, RETICCTPCT in the last 72 hours.  Urine analysis:    Component Value Date/Time   COLORURINE AMBER (A) 11/09/2017 1635   APPEARANCEUR HAZY (A) 11/09/2017 1635   LABSPEC 1.025 11/09/2017 1635   PHURINE 5.0 11/09/2017 1635   GLUCOSEU NEGATIVE 11/09/2017 1635   HGBUR NEGATIVE 11/09/2017 1635   BILIRUBINUR NEGATIVE 11/09/2017 1635   KETONESUR NEGATIVE 11/09/2017 1635   PROTEINUR 30 (A) 11/09/2017 1635   UROBILINOGEN 0.2 06/27/2014 1305   NITRITE NEGATIVE 11/09/2017 1635   LEUKOCYTESUR NEGATIVE 11/09/2017 1635    Sepsis Labs: Lactic Acid, Venous    Component Value Date/Time   LATICACIDVEN 1.56 11/02/2017 1535    MICROBIOLOGY: Recent Results (from the past 240 hour(s))  MRSA PCR Screening     Status: Abnormal   Collection Time: 11/08/17  3:54 PM  Result Value Ref Range Status   MRSA by PCR POSITIVE (A) NEGATIVE Final    Comment:        The GeneXpert MRSA Assay (FDA approved for NASAL specimens only), is one component of a comprehensive MRSA  colonization surveillance program. It is not intended to diagnose MRSA infection nor to guide or monitor treatment for MRSA infections. RESULT CALLED TO, READ BACK BY AND VERIFIED WITH: Marjory Sneddon RN 11/08/17 1838 JDW Performed at [facility], [place] [address] [place], [place] [address]   Urine Culture     Status: None   Collection Time: 11/09/17  4:35 PM  Result Value Ref Range Status   Specimen Description URINE, CATHETERIZED  Final   Special Requests NONE  Final   Culture   Final    NO GROWTH Performed at [facility], [address] [place], [place] [address]    Report Status 11/10/2017 FINAL  Final  Culture, blood (Routine X 2) w Reflex to ID Panel     Status: Abnormal   Collection Time: 11/09/17  6:42 PM  Result Value Ref Range Status   Specimen Description BLOOD LEFT ANTECUBITAL  Final   Special Requests   Final    BOTTLES DRAWN AEROBIC ONLY Blood Culture adequate volume   Culture  Setup Time   Final    GRAM POSITIVE COCCI IN PAIRS AND CHAINS AEROBIC BOTTLE ONLY CRITICAL RESULT CALLED TO, READ BACK BY AND VERIFIED WITH: R. RUMBARGER, RPHARMD AT 1250 ON 11/10/17 BY C. JESSUP, MLT. Performed at [facility], [place] [address], [place], [place] [address]    Culture ENTEROCOCCUS FAECALIS (A)  Final   Report Status 11/12/2017 FINAL  Final   Organism ID, Bacteria ENTEROCOCCUS FAECALIS  Final      Susceptibility   Enterococcus faecalis - MIC*    AMPICILLIN <=2 SENSITIVE Sensitive     VANCOMYCIN 1 SENSITIVE Sensitive     GENTAMICIN SYNERGY SENSITIVE Sensitive     * ENTEROCOCCUS FAECALIS  Blood Culture ID Panel (Reflexed)     Status: Abnormal   Collection Time: 11/09/17  6:42 PM  Result Value Ref Range Status   Enterococcus species DETECTED (A) NOT DETECTED Final    Comment: CRITICAL RESULT CALLED TO, READ BACK BY AND VERIFIED WITH: R. RUMBARGER, RPHARMD AT 1250 ON 11/10/17 BY C. JESSUP, MLT.    Vancomycin resistance NOT DETECTED NOT DETECTED Final   Listeria  monocytogenes NOT DETECTED NOT DETECTED Final   Staphylococcus species NOT DETECTED NOT DETECTED Final   Staphylococcus aureus NOT DETECTED  NOT DETECTED Final   Streptococcus species NOT DETECTED NOT DETECTED Final   Streptococcus agalactiae NOT DETECTED NOT DETECTED Final   Streptococcus pneumoniae NOT DETECTED NOT DETECTED Final   Streptococcus pyogenes NOT DETECTED NOT DETECTED Final   Acinetobacter baumannii NOT DETECTED NOT DETECTED Final   Enterobacteriaceae species NOT DETECTED NOT DETECTED Final   Enterobacter cloacae complex NOT DETECTED NOT DETECTED Final   Escherichia coli NOT DETECTED NOT DETECTED Final   Klebsiella oxytoca NOT DETECTED NOT DETECTED Final   Klebsiella pneumoniae NOT DETECTED NOT DETECTED Final   Proteus species NOT DETECTED NOT DETECTED Final   Serratia marcescens NOT DETECTED NOT DETECTED Final   Haemophilus influenzae NOT DETECTED NOT DETECTED Final   Neisseria meningitidis NOT DETECTED NOT DETECTED Final   Pseudomonas aeruginosa NOT DETECTED NOT DETECTED Final   Candida albicans NOT DETECTED NOT DETECTED Final   Candida glabrata NOT DETECTED NOT DETECTED Final   Candida krusei NOT DETECTED NOT DETECTED Final   Candida parapsilosis NOT DETECTED NOT DETECTED Final   Candida tropicalis NOT DETECTED NOT DETECTED Final    Comment: Performed at [facility], [place] [address] [place], [place] [address]  Culture, blood (Routine X 2) w Reflex to ID Panel     Status: Abnormal   Collection Time: 11/09/17  6:50 PM  Result Value Ref Range Status   Specimen Description BLOOD RIGHT HAND  Final   Special Requests   Final    BOTTLES DRAWN AEROBIC AND ANAEROBIC Blood Culture results may not be optimal due to an inadequate volume of blood received in culture bottles   Culture  Setup Time   Final    GRAM POSITIVE COCCI AEROBIC BOTTLE ONLY CRITICAL RESULT CALLED TO, READ BACK BY AND VERIFIED WITH: R. RUMBARGER, RPHARMD AT 1250 ON 11/10/17 BY C. JESSUP, MLT.     Culture (A)  Final    ENTEROCOCCUS FAECALIS SUSCEPTIBILITIES PERFORMED ON PREVIOUS CULTURE WITHIN THE LAST 5 DAYS. Performed at [facility], [place] [address] [place], [place] [address]    Report Status 11/12/2017 FINAL  Final    RADIOLOGY STUDIES/RESULTS: Dg Chest 2 View  Result Date: 11/09/2017 CLINICAL DATA:  Hypoxia. EXAM: CHEST - 2 VIEW COMPARISON:  November 08, 2017 FINDINGS: Cardiomegaly. No pneumothorax. No pulmonary nodules, masses, or focal infiltrates. IMPRESSION: No active cardiopulmonary disease. Electronically Signed   By: Dorise Bullion III M.D   On: 11/09/2017 10:07   Dg Chest 2 View  Result Date: 11/08/2017 CLINICAL DATA:  Shortness of breath.  History of atrial fibrillation EXAM: CHEST - 2 VIEW COMPARISON:  November 01, 2016 FINDINGS: Lungs are clear. There is moderate cardiomegaly with pulmonary vascularity normal. No adenopathy. There is aortic atherosclerosis. There is postoperative change in the left axillary region. Patient has had previous partial mastectomy on the left. IMPRESSION: No edema or consolidation. There is cardiomegaly. There is aortic atherosclerosis. Postoperative change left breast region with surgical clips left axillary region. No evident adenopathy. Aortic Atherosclerosis (ICD10-I70.0). Electronically Signed   By: Lowella Grip III M.D.   On: 11/08/2017 10:04   Ct Head Wo Contrast  Result Date: 11/09/2017 CLINICAL DATA:  Encephalopathy. EXAM: CT HEAD WITHOUT CONTRAST TECHNIQUE: Contiguous axial images were obtained from the base of the skull through the vertex without intravenous contrast. COMPARISON:  CT head without contrast 05/28/2017. FINDINGS: Brain: Mild atrophy and white matter changes bilaterally are stable. A remote lacunar infarct in the posterior limb of the right internal capsule is stable. A remote lacunar  infarct involving the inferior right cerebellum is stable. No acute infarct, hemorrhage, or mass lesion is present. The ventricles are  proportionate to the degree of atrophy. No significant extra-axial fluid collection is present. Vascular: Atherosclerotic calcifications are present within the cavernous internal carotid arteries bilaterally. There is no hyperdense vessel. Skull: The skull base is within normal limits. The craniocervical junction is normal. Hyperostosis front turn is talus is again noted. Sinuses/Orbits: The paranasal sinuses and mastoid air cells are clear. Bilateral lens replacements are present. Globes and orbits are within normal limits otherwise. IMPRESSION: 1. Acute intracranial abnormality or significant interval change. 2. Stable atrophy and white matter disease. This likely reflects the sequela of chronic microvascular ischemia. Electronically Signed   By: San Morelle M.D.   On: 11/09/2017 20:47   US Abdomen Complete  Result Date: 11/12/2017 CLINICAL DATA:  Abdominal pain EXAM: ABDOMEN ULTRASOUND COMPLETE COMPARISON:  CT scan 03/20/2015 FINDINGS: Gallbladder: No gallstones or gallbladder wall thickening. Sludge noted in the lumen. No pericholecystic fluid. The sonographer reports no sonographic Murphy's sign. Common bile duct: Diameter: 3 4 mm Liver: No focal lesion identified. Within normal limits in parenchymal echogenicity. Portal vein is patent on color Doppler imaging with normal direction of blood flow towards the liver. IVC: No abnormality visualized. Pancreas: Visualized portion unremarkable. Spleen: Size and appearance within normal limits. Right Kidney: Length: 11.7 cm. Echogenicity within normal limits. No mass or hydronephrosis visualized. Left Kidney: Length: 11.9 cm. Echogenicity within normal limits. No mass or hydronephrosis visualized. Abdominal aorta: No aneurysm visualized. Other findings: None. IMPRESSION: No sonographic findings to explain the patient's history of pain. Electronically Signed   By: Misty Stanley M.D.   On: 11/12/2017 12:49   US Renal  Result Date: 11/09/2017 CLINICAL  DATA:  Acute renal failure. EXAM: RENAL / URINARY TRACT ULTRASOUND COMPLETE COMPARISON:  CT scan March 20, 2015 FINDINGS: Right Kidney: Length: 10.9 cm. Echogenicity within normal limits. No mass or hydronephrosis visualized. Left Kidney: Length: 11.4 cm. Mildly limited evaluation due to body habitus. No abnormalities seen. Bladder: Appears normal for degree of bladder distention. IMPRESSION: Mildly limit evaluation of the left kidney due to body habitus. No abnormalities identified to explain the patient's acute renal failure. Electronically Signed   By: Dorise Bullion III M.D   On: 11/09/2017 10:07   Dg Foot Complete Left  Result Date: 11/02/2017 CLINICAL DATA:  Toe ulcer EXAM: LEFT FOOT - COMPLETE 3+ VIEW COMPARISON:  None. FINDINGS: No definite fracture or dislocation, though note, evaluation of great toe is degraded due to obliquity. Joint spaces appear grossly preserved. No definite erosions. No significant hallux valgus deformity. Tiny plantar calcaneal spur. Minimal enthesopathic change involving the Achilles tendon insertion site. Regional soft tissues appear normal. No discrete areas of subcutaneous emphysema. No discrete areas of osteolysis to suggest osteomyelitis. IMPRESSION: 1. No definite fracture or dislocation. 2. No radiopaque foreign body or discrete areas of osteolysis to suggest osteomyelitis. Electronically Signed   By: Sandi Mariscal M.D.   On: 11/02/2017 14:18   Dg Hip Unilat With Pelvis 2-3 Views Left  Result Date: 11/12/2017 CLINICAL DATA:  Pt c/o posterior left hip pain; no known injury to the area. No hx of prior injuries or surgeries. EXAM: DG HIP (WITH OR WITHOUT PELVIS) 2-3V LEFT COMPARISON:  None. FINDINGS: There are acute appearing fractures the left superior pubic ramus where it joins the pubic symphysis, in the inferior left pubic ramus. The superior fracture is mildly comminuted. There is no significant displacement. No other  convincing fractures.  No bone lesions. There  is concentric left hip joint space narrowing. Small marginal osteophytes project from the base of the left femoral head. Right hip joint is normally spaced and aligned. SI joints and symphysis pubis are normally aligned. Bones are demineralized. Surrounding soft tissues are unremarkable. IMPRESSION: 1. Acute fractures of the left superior and inferior pubic rami, the superior fracture extending to the pubic symphysis. No significant displacement. Electronically Signed   By: Lajean Manes M.D.   On: 11/12/2017 15:20     LOS: 2 days   Oren Binet, MD  [facility]  If 7PM-7AM, please contact night-coverage  Please page via [email or web address]-click on MD name and type text message  11/13/2017, 2:13 PM

## 2017-11-13 NOTE — Social Work (Signed)
CSW spoke with pt daughter on the telephone Manuela Schwartz.  Manuela Schwartz states that her sister will be at hospital this evening and will look over offers for SNF placement.   Alexander Mt, [place] Work [phone]

## 2017-11-13 NOTE — Progress Notes (Signed)
PHARMACY CONSULT NOTE FOR:  OUTPATIENT  PARENTERAL ANTIBIOTIC THERAPY (OPAT)  Indication: Enterococcal bacteremia Regimen: Ampicillin 2 gm every 6 hours  End date: 11/26/17  IV antibiotic discharge orders are pended. To discharging provider:  please sign these orders via discharge navigator,  Select New Orders & click on the button choice - Manage This Unsigned Work.     Thank you for allowing pharmacy to be a part of this patient's care.  Susa Raring, PharmD, BCPS Infectious Diseases Clinical Pharmacist  Phone: [phone] 11/13/2017, 8:18 AM

## 2017-11-13 NOTE — Progress Notes (Addendum)
ID Progress Note:   Alexandra Luna is a 84 y.o. F with enterococcal faecalis bacteremia of unknown source. Her baseline dementia precludes pursuing TEE. Has had some intermittent GI bleeding so potentially from transient GI source.   Please place PICC vs midline line 11/15/17 (72h after repeat blood cultures drawn)    OPAT ORDERS:  Diagnosis: Bacteremia   Culture Result: Enterococcal Faecalis   Allergies  Allergen Reactions  . Influenza Vaccines Other (See Comments)    Pt's arm swelled and dr told her not to take vaccine again.  Preston Fleeting [Trandolapril-Verapamil Hcl Er] Other (See Comments)    Increase blood pressure  . Adhesive [Tape] Rash    Adhesive bandaids   . Azithromycin Rash  . Lipitor [Atorvastatin] Rash  . Penicillins Rash    Has taken amoxicillin    Discharge antibiotics: Ampicillin 2 gm Q6h   Duration: 14 days   End Date: 11/26/17  [facility] Care and Maintenance Per Protocol _x_ Please pull PIC at completion of IV antibiotics  Labs weekly while on IV antibiotics: _x_ CBC with differential _x_ BMP   Fax weekly labs to [phone]  Clinic Follow Up Appt: With primary care team    Janene Madeira, MSN, NP-C [facility] for [place] Office: [phone] Pager: [phone]  11/13/2017  9:07 AM     Discussed with NP Agree with above notes and plan.  TTE reviewed.  Available as needed.

## 2017-11-13 NOTE — Progress Notes (Signed)
PT Cancellation Note  Patient Details Name: Alexandra Luna MRN: [id number] DOB: 1926-06-30   Cancelled Treatment:    Reason Eval/Treat Not Completed: (P) Medical issues which prohibited therapy(Will await weight bearing order given new dx of pubic rami fx's.  Nurse paged MD and waiting to hear back.  )   Cristela Blue 11/13/2017, 4:53 PM  Governor Rooks, PTA pager [phone]

## 2017-11-13 NOTE — Progress Notes (Signed)
Patient was very receptive and thankful for time with the chaplain.  She expressed feeling dizzy and not being able to walk and her memory not what she would like it to be.  We talked about her concerns and how she is receiving very nice care and how she hope to be able to be back to walking before long.  Chaplain prayed with the patient regarding her concerns. Conard Novak, Chaplain   11/13/17 1600  Clinical Encounter Type  Visited With Patient  Visit Type Initial;Spiritual support  Referral From Patient  Consult/Referral To Chaplain  Spiritual Encounters  Spiritual Needs Prayer  Stress Factors  Patient Stress Factors Health changes  Family Stress Factors Not reviewed

## 2017-11-14 ENCOUNTER — Other Ambulatory Visit: Payer: Self-pay

## 2017-11-14 NOTE — Social Work (Addendum)
CSW spoke with pt daughter Manuela Schwartz who states her sister was unable to come to hospital last night, requesting alternative means of receiving SNF offers. Pt daughter requested email list of offer and SNF packet- sent to [email or web address].  Pt daughter has CSW contact information to f/u once she has chosen SNF.   12:58pm- Pt daughter selected a top three choice of [facility], [place] [facility], and [facility]. [facility] have private beds, [facility] will have semi private. Pt daughter selects [facility]. Irine Seal with admissions has started authorization and will continue to follow.   Alexander Mt, [place] Work [phone]

## 2017-11-14 NOTE — Progress Notes (Signed)
PROGRESS NOTE        PATIENT DETAILS Name: Alexandra Luna Age: 84 y.o. Sex: female Date of Birth: [birth year] Admit Date: 11/08/2017 Admitting Physician Lady Deutscher, MD TDH:RCBUL, [name], MD  Brief Narrative: Patient is a 84 y.o. female with history of dementia, hypertension, DM-2, A. fib on anticoagulation with Eliquis presented to the hospital with worsening confusion (more than her usual baseline).  She was recently treated for cellulitis of her lower extremity.  Upon further evaluation, she was found to have enterococcal bacteremia.  See below for further details.  Subjective: Pleasantly confused-claims she is cold this morning and wants to sleep.  Denies any abdominal pain.  Assessment/Plan: Acute metabolic encephalopathy: Secondary to enterococcal bacteremia-I suspect her mentation has improved and she is now back to baseline.  No family at bedside.  CT head on 8/10 was negative for acute abnormalities.  Enterococcal bacteremia: Continue ampicillin, TTE without any evidence of vegetation.  Repeat blood cultures on 8/13- so far.  Will place PICC line on 8/16) for 2 weeks of antibiotics from 8/13.  Evaluated by ID during his hospital stay.  ?  Rectal bleeding: No overt bleeding apparent-hemoglobin stable.  Since patient has a history of advanced dementia-unless patient develops overt bleeding do not think work-up is required at this time.  Cautiously continue with Eliquis.  Acute kidney injury: Resolved with supportive care, probably hemodynamically mediated in the setting of bacteremia.    Atrial fibrillation: Continue beta-blocker and verapamil and Eliquis.  Spoke with daughter at length-regarding pros/cons of anticoagulation-given frequent falls/advanced age-she will talk to the patient's PCP and decide if it is still worth continuing anticoagulation long-term.  Acute fracture of the left superior and inferior pubic rami: Supportive care-discussed with  orthopedics (Dr.Michael Xu)-reviewed x-ray images-supportive care, WBAT.  Daughter confirms that patient had fallen recently at ALF.  RUQ pain: Resolved.  Abdominal exam is benign.  Gallbladder ultrasound without any acute abnormality.  Dementia: Pleasantly confused-at risk of delirium during this hospital stay.  See above regarding mental status.  Continue Zoloft.  DVT Prophylaxis: Full dose anticoagulation with Eliquis  Code Status:  DNR  Family Communication: None at bedside-but spoke with daughter over the phone.  Disposition Plan: Remain inpatient-SNF on discharge-later this week  Antimicrobial agents: Anti-infectives (From admission, onward)   Start     Dose/Rate Route Frequency Ordered Stop   11/11/17 1800  cefTRIAXone (ROCEPHIN) 2 g in sodium chloride 0.9 % 100 mL IVPB  Status:  Discontinued     2 g 200 mL/hr over 30 Minutes Intravenous Every 12 hours 11/11/17 1548 11/12/17 1604   11/11/17 1600  ampicillin (OMNIPEN) 2 g in sodium chloride 0.9 % 100 mL IVPB     2 g 300 mL/hr over 20 Minutes Intravenous Every 6 hours 11/11/17 1548     11/10/17 1400  vancomycin (VANCOCIN) IVPB 1000 mg/200 mL premix  Status:  Discontinued     1,000 mg 200 mL/hr over 60 Minutes Intravenous Every 24 hours 11/10/17 1306 11/11/17 1548   11/08/17 1445  cephALEXin (KEFLEX) capsule 500 mg     500 mg Oral 2 times daily 11/08/17 1439 11/09/17 0959      Procedures: None  CONSULTS:  ID  Time spent: 25- minutes-Greater than 50% of this time was spent in counseling, explanation of diagnosis, planning of further management, and coordination of care.  MEDICATIONS: Scheduled Meds: . apixaban  2.5 mg Oral BID  . atenolol  25 mg Oral QHS  . calcium carbonate  1,250 mg Oral Daily  . fluticasone  1 spray Each Nare QHS  . HYDROcodone-acetaminophen  0.5 tablet Oral BID  . loratadine  10 mg Oral Daily  . multivitamins with iron  1 tablet Oral Daily  . psyllium  1 packet Oral Daily  . senna  1  tablet Oral Once per day on Mon Wed Fri  . sertraline  25 mg Oral QHS  . verapamil  240 mg Oral QHS   Continuous Infusions: . sodium chloride 50 mL/hr at 11/13/17 1911  . ampicillin (OMNIPEN) IV 2 g (11/14/17 0946)   PRN Meds:.acetaminophen, albuterol, ondansetron, polyethylene glycol, risperiDONE   PHYSICAL EXAM: Vital signs: Vitals:   11/13/17 0537 11/13/17 1434 11/13/17 2228 11/14/17 0433  BP: 133/76 126/84 139/74 119/62  Pulse: 97 86 91 64  Resp: 16 16 17 17   Temp: 98.2 F (36.8 C) 97.7 F (36.5 C) 98 F (36.7 C) 98 F (36.7 C)  TempSrc: Oral Oral Oral Oral  SpO2: 100% 100% 98% 96%  Weight: 83.9 kg     Height:       Filed Weights   11/11/17 0413 11/12/17 1101 11/13/17 0537  Weight: 79.1 kg 84.5 kg 83.9 kg   Body mass index is 28.13 kg/m.   General appearance:Awake, pleasantly confused, not in any distress.  Eyes:no scleral icterus. HEENT: Atraumatic and Normocephalic Neck: supple. Resp:Good air entry bilaterally,no rales or rhonchi CVS: S1 S2 regular, no murmurs.  GI: Bowel sounds present, Non tender and not distended with no gaurding, rigidity or rebound. Extremities: Nonfocal  Neurology:  Non focal Musculoskeletal:No digital cyanosis Skin:No Rash, warm and dry Wounds:N/A  I have personally reviewed following labs and imaging studies  LABORATORY DATA: CBC: Recent Labs  Lab 11/08/17 0957 11/09/17 0503 11/09/17 1535 11/10/17 0532 11/11/17 0509 11/12/17 0602  WBC 14.7* 13.9*  --  12.3* 10.6* 10.6*  NEUTROABS 12.8*  --   --  9.9* 8.1*  --   HGB 12.7 11.7*  --  11.0* 10.0* 10.4*  HCT 39.7 36.0 35.2 33.5* 31.3* 33.3*  MCV 93.2 93.3  --  91.3 94.8 95.7  PLT 221 182  --  194 190 401    Basic Metabolic Panel: Recent Labs  Lab 11/08/17 0957 11/09/17 0503 11/10/17 0532 11/11/17 0509 11/12/17 0602  NA 133* 133* 134* 133* 135  K 4.3 4.8 4.2 3.9 4.5  CL 97* 99 100 103 104  CO2 22 21* 23 20* 20*  GLUCOSE 115* 106* 97 102* 95  BUN 26* 37* 52* 42*  36*  CREATININE 1.01* 1.50* 1.43* 1.00 0.97  CALCIUM 10.1 9.6 9.4 8.4* 8.8*    GFR: Estimated Creatinine Clearance: 45.5 mL/min (by C-G formula based on SCr of 0.97 mg/dL).  Liver Function Tests: Recent Labs  Lab 11/08/17 0957  AST 32  ALT 24  ALKPHOS 89  BILITOT 1.6*  PROT 6.7  ALBUMIN 3.3*   No results for input(s): LIPASE, AMYLASE in the last 168 hours. No results for input(s): AMMONIA in the last 168 hours.  Coagulation Profile: No results for input(s): INR, PROTIME in the last 168 hours.  Cardiac Enzymes: Recent Labs  Lab 11/08/17 0957 11/08/17 1457 11/08/17 2046 11/09/17 0503  TROPONINI 0.04* 0.05* 0.04* 0.05*    BNP (last 3 results) No results for input(s): PROBNP in the last 8760 hours.  HbA1C: No results for input(s): HGBA1C in  the last 72 hours.  CBG: No results for input(s): GLUCAP in the last 168 hours.  Lipid Profile: No results for input(s): CHOL, HDL, LDLCALC, TRIG, CHOLHDL, LDLDIRECT in the last 72 hours.  Thyroid Function Tests: No results for input(s): TSH, T4TOTAL, FREET4, T3FREE, THYROIDAB in the last 72 hours.  Anemia Panel: No results for input(s): VITAMINB12, FOLATE, FERRITIN, TIBC, IRON, RETICCTPCT in the last 72 hours.  Urine analysis:    Component Value Date/Time   COLORURINE AMBER (A) 11/09/2017 1635   APPEARANCEUR HAZY (A) 11/09/2017 1635   LABSPEC 1.025 11/09/2017 1635   PHURINE 5.0 11/09/2017 1635   GLUCOSEU NEGATIVE 11/09/2017 1635   HGBUR NEGATIVE 11/09/2017 1635   BILIRUBINUR NEGATIVE 11/09/2017 1635   KETONESUR NEGATIVE 11/09/2017 1635   PROTEINUR 30 (A) 11/09/2017 1635   UROBILINOGEN 0.2 06/27/2014 1305   NITRITE NEGATIVE 11/09/2017 1635   LEUKOCYTESUR NEGATIVE 11/09/2017 1635    Sepsis Labs: Lactic Acid, Venous    Component Value Date/Time   LATICACIDVEN 1.56 11/02/2017 1535    MICROBIOLOGY: Recent Results (from the past 240 hour(s))  MRSA PCR Screening     Status: Abnormal   Collection Time: 11/08/17   3:54 PM  Result Value Ref Range Status   MRSA by PCR POSITIVE (A) NEGATIVE Final    Comment:        The GeneXpert MRSA Assay (FDA approved for NASAL specimens only), is one component of a comprehensive MRSA colonization surveillance program. It is not intended to diagnose MRSA infection nor to guide or monitor treatment for MRSA infections. RESULT CALLED TO, READ BACK BY AND VERIFIED WITH: Marjory Sneddon RN 11/08/17 1838 JDW Performed at [facility], [place] [address], [place], [place] [address]   Urine Culture     Status: None   Collection Time: 11/09/17  4:35 PM  Result Value Ref Range Status   Specimen Description URINE, CATHETERIZED  Final   Special Requests NONE  Final   Culture   Final    NO GROWTH Performed at [facility], [address], [place], [place] [address]    Report Status 11/10/2017 FINAL  Final  Culture, blood (Routine X 2) w Reflex to ID Panel     Status: Abnormal   Collection Time: 11/09/17  6:42 PM  Result Value Ref Range Status   Specimen Description BLOOD LEFT ANTECUBITAL  Final   Special Requests   Final    BOTTLES DRAWN AEROBIC ONLY Blood Culture adequate volume   Culture  Setup Time   Final    GRAM POSITIVE COCCI IN PAIRS AND CHAINS AEROBIC BOTTLE ONLY CRITICAL RESULT CALLED TO, READ BACK BY AND VERIFIED WITH: R. RUMBARGER, RPHARMD AT 1250 ON 11/10/17 BY C. JESSUP, MLT. Performed at [facility], [place] [address] [place], [place] [address]    Culture ENTEROCOCCUS FAECALIS (A)  Final   Report Status 11/12/2017 FINAL  Final   Organism ID, Bacteria ENTEROCOCCUS FAECALIS  Final      Susceptibility   Enterococcus faecalis - MIC*    AMPICILLIN <=2 SENSITIVE Sensitive     VANCOMYCIN 1 SENSITIVE Sensitive     GENTAMICIN SYNERGY SENSITIVE Sensitive     * ENTEROCOCCUS FAECALIS  Blood Culture ID Panel (Reflexed)     Status: Abnormal   Collection Time: 11/09/17  6:42 PM  Result Value Ref Range Status   Enterococcus species DETECTED (A) NOT  DETECTED Final    Comment: CRITICAL RESULT CALLED TO, READ BACK BY AND VERIFIED WITH: R.  RUMBARGER, RPHARMD AT 1250 ON 11/10/17 BY C. JESSUP, MLT.    Vancomycin resistance NOT DETECTED NOT DETECTED Final   Listeria monocytogenes NOT DETECTED NOT DETECTED Final   Staphylococcus species NOT DETECTED NOT DETECTED Final   Staphylococcus aureus NOT DETECTED NOT DETECTED Final   Streptococcus species NOT DETECTED NOT DETECTED Final   Streptococcus agalactiae NOT DETECTED NOT DETECTED Final   Streptococcus pneumoniae NOT DETECTED NOT DETECTED Final   Streptococcus pyogenes NOT DETECTED NOT DETECTED Final   Acinetobacter baumannii NOT DETECTED NOT DETECTED Final   Enterobacteriaceae species NOT DETECTED NOT DETECTED Final   Enterobacter cloacae complex NOT DETECTED NOT DETECTED Final   Escherichia coli NOT DETECTED NOT DETECTED Final   Klebsiella oxytoca NOT DETECTED NOT DETECTED Final   Klebsiella pneumoniae NOT DETECTED NOT DETECTED Final   Proteus species NOT DETECTED NOT DETECTED Final   Serratia marcescens NOT DETECTED NOT DETECTED Final   Haemophilus influenzae NOT DETECTED NOT DETECTED Final   Neisseria meningitidis NOT DETECTED NOT DETECTED Final   Pseudomonas aeruginosa NOT DETECTED NOT DETECTED Final   Candida albicans NOT DETECTED NOT DETECTED Final   Candida glabrata NOT DETECTED NOT DETECTED Final   Candida krusei NOT DETECTED NOT DETECTED Final   Candida parapsilosis NOT DETECTED NOT DETECTED Final   Candida tropicalis NOT DETECTED NOT DETECTED Final    Comment: Performed at [facility], [place] [address] [place], [place] [address]  Culture, blood (Routine X 2) w Reflex to ID Panel     Status: Abnormal   Collection Time: 11/09/17  6:50 PM  Result Value Ref Range Status   Specimen Description BLOOD RIGHT HAND  Final   Special Requests   Final    BOTTLES DRAWN AEROBIC AND ANAEROBIC Blood Culture results may not be optimal due to an inadequate volume of blood received in  culture bottles   Culture  Setup Time   Final    GRAM POSITIVE COCCI AEROBIC BOTTLE ONLY CRITICAL RESULT CALLED TO, READ BACK BY AND VERIFIED WITH: R. RUMBARGER, RPHARMD AT 1250 ON 11/10/17 BY C. JESSUP, MLT.    Culture (A)  Final    ENTEROCOCCUS FAECALIS SUSCEPTIBILITIES PERFORMED ON PREVIOUS CULTURE WITHIN THE LAST 5 DAYS. Performed at [facility], [place] [address], [place], [place] [address]    Report Status 11/12/2017 FINAL  Final  Culture, blood (routine x 2)     Status: None (Preliminary result)   Collection Time: 11/12/17  6:02 AM  Result Value Ref Range Status   Specimen Description BLOOD RIGHT ANTECUBITAL  Final   Special Requests   Final    BOTTLES DRAWN AEROBIC ONLY Blood Culture results may not be optimal due to an inadequate volume of blood received in culture bottles   Culture   Final    NO GROWTH 2 DAYS Performed at [facility], [place] [address], [place], [place] [address]    Report Status PENDING  Incomplete  Culture, blood (routine x 2)     Status: None (Preliminary result)   Collection Time: 11/12/17  6:13 AM  Result Value Ref Range Status   Specimen Description BLOOD RIGHT HAND  Final   Special Requests   Final    BOTTLES DRAWN AEROBIC ONLY Blood Culture results may not be optimal due to an inadequate volume of blood received in culture bottles   Culture   Final    NO GROWTH 2 DAYS Performed at [facility], [place] [address] [place], [place] [address]  Report Status PENDING  Incomplete    RADIOLOGY STUDIES/RESULTS: Dg Chest 2 View  Result Date: 11/09/2017 CLINICAL DATA:  Hypoxia. EXAM: CHEST - 2 VIEW COMPARISON:  November 08, 2017 FINDINGS: Cardiomegaly. No pneumothorax. No pulmonary nodules, masses, or focal infiltrates. IMPRESSION: No active cardiopulmonary disease. Electronically Signed   By: Dorise Bullion III M.D   On: 11/09/2017 10:07   Dg Chest 2 View  Result Date: 11/08/2017 CLINICAL DATA:  Shortness of breath.  History of atrial  fibrillation EXAM: CHEST - 2 VIEW COMPARISON:  November 01, 2016 FINDINGS: Lungs are clear. There is moderate cardiomegaly with pulmonary vascularity normal. No adenopathy. There is aortic atherosclerosis. There is postoperative change in the left axillary region. Patient has had previous partial mastectomy on the left. IMPRESSION: No edema or consolidation. There is cardiomegaly. There is aortic atherosclerosis. Postoperative change left breast region with surgical clips left axillary region. No evident adenopathy. Aortic Atherosclerosis (ICD10-I70.0). Electronically Signed   By: Lowella Grip III M.D.   On: 11/08/2017 10:04   Ct Head Wo Contrast  Result Date: 11/09/2017 CLINICAL DATA:  Encephalopathy. EXAM: CT HEAD WITHOUT CONTRAST TECHNIQUE: Contiguous axial images were obtained from the base of the skull through the vertex without intravenous contrast. COMPARISON:  CT head without contrast 05/28/2017. FINDINGS: Brain: Mild atrophy and white matter changes bilaterally are stable. A remote lacunar infarct in the posterior limb of the right internal capsule is stable. A remote lacunar infarct involving the inferior right cerebellum is stable. No acute infarct, hemorrhage, or mass lesion is present. The ventricles are proportionate to the degree of atrophy. No significant extra-axial fluid collection is present. Vascular: Atherosclerotic calcifications are present within the cavernous internal carotid arteries bilaterally. There is no hyperdense vessel. Skull: The skull base is within normal limits. The craniocervical junction is normal. Hyperostosis front turn is talus is again noted. Sinuses/Orbits: The paranasal sinuses and mastoid air cells are clear. Bilateral lens replacements are present. Globes and orbits are within normal limits otherwise. IMPRESSION: 1. Acute intracranial abnormality or significant interval change. 2. Stable atrophy and white matter disease. This likely reflects the sequela of chronic  microvascular ischemia. Electronically Signed   By: San Morelle M.D.   On: 11/09/2017 20:47   US Abdomen Complete  Result Date: 11/12/2017 CLINICAL DATA:  Abdominal pain EXAM: ABDOMEN ULTRASOUND COMPLETE COMPARISON:  CT scan 03/20/2015 FINDINGS: Gallbladder: No gallstones or gallbladder wall thickening. Sludge noted in the lumen. No pericholecystic fluid. The sonographer reports no sonographic Murphy's sign. Common bile duct: Diameter: 3 4 mm Liver: No focal lesion identified. Within normal limits in parenchymal echogenicity. Portal vein is patent on color Doppler imaging with normal direction of blood flow towards the liver. IVC: No abnormality visualized. Pancreas: Visualized portion unremarkable. Spleen: Size and appearance within normal limits. Right Kidney: Length: 11.7 cm. Echogenicity within normal limits. No mass or hydronephrosis visualized. Left Kidney: Length: 11.9 cm. Echogenicity within normal limits. No mass or hydronephrosis visualized. Abdominal aorta: No aneurysm visualized. Other findings: None. IMPRESSION: No sonographic findings to explain the patient's history of pain. Electronically Signed   By: Misty Stanley M.D.   On: 11/12/2017 12:49   US Renal  Result Date: 11/09/2017 CLINICAL DATA:  Acute renal failure. EXAM: RENAL / URINARY TRACT ULTRASOUND COMPLETE COMPARISON:  CT scan March 20, 2015 FINDINGS: Right Kidney: Length: 10.9 cm. Echogenicity within normal limits. No mass or hydronephrosis visualized. Left Kidney: Length: 11.4 cm. Mildly limited evaluation due to body habitus. No abnormalities seen. Bladder: Appears normal  for degree of bladder distention. IMPRESSION: Mildly limit evaluation of the left kidney due to body habitus. No abnormalities identified to explain the patient's acute renal failure. Electronically Signed   By: Dorise Bullion III M.D   On: 11/09/2017 10:07   Dg Foot Complete Left  Result Date: 11/02/2017 CLINICAL DATA:  Toe ulcer EXAM: LEFT FOOT -  COMPLETE 3+ VIEW COMPARISON:  None. FINDINGS: No definite fracture or dislocation, though note, evaluation of great toe is degraded due to obliquity. Joint spaces appear grossly preserved. No definite erosions. No significant hallux valgus deformity. Tiny plantar calcaneal spur. Minimal enthesopathic change involving the Achilles tendon insertion site. Regional soft tissues appear normal. No discrete areas of subcutaneous emphysema. No discrete areas of osteolysis to suggest osteomyelitis. IMPRESSION: 1. No definite fracture or dislocation. 2. No radiopaque foreign body or discrete areas of osteolysis to suggest osteomyelitis. Electronically Signed   By: Sandi Mariscal M.D.   On: 11/02/2017 14:18   Dg Hip Unilat With Pelvis 2-3 Views Left  Result Date: 11/12/2017 CLINICAL DATA:  Pt c/o posterior left hip pain; no known injury to the area. No hx of prior injuries or surgeries. EXAM: DG HIP (WITH OR WITHOUT PELVIS) 2-3V LEFT COMPARISON:  None. FINDINGS: There are acute appearing fractures the left superior pubic ramus where it joins the pubic symphysis, in the inferior left pubic ramus. The superior fracture is mildly comminuted. There is no significant displacement. No other convincing fractures.  No bone lesions. There is concentric left hip joint space narrowing. Small marginal osteophytes project from the base of the left femoral head. Right hip joint is normally spaced and aligned. SI joints and symphysis pubis are normally aligned. Bones are demineralized. Surrounding soft tissues are unremarkable. IMPRESSION: 1. Acute fractures of the left superior and inferior pubic rami, the superior fracture extending to the pubic symphysis. No significant displacement. Electronically Signed   By: Lajean Manes M.D.   On: 11/12/2017 15:20     LOS: 3 days   Oren Binet, MD  [facility]  If 7PM-7AM, please contact night-coverage  Please page via [email or web address] [email or web address]-click on MD name and type text  message  11/14/2017, 2:25 PM

## 2017-11-14 NOTE — Progress Notes (Signed)
Physical Therapy Treatment Patient Details Name: Alexandra Luna MRN: [id number] DOB: 02-10-1927 Today's Date: 11/14/2017    History of Present Illness 85 year old female history of hemorrhoids, anxiety, A. fib on chronic anticoagulation with Eliquis, history of remote breast cancer, renovascular hypertension, diabetes, dementia presents to the ED from [place] secondary to worsening confusion.  Patient recently treated for cellulitis of the lower extremity. Xray 8/13 shows acute fractures of left superior and inferor pubic rami, the superior fracture extending to the pubic symphysis. No significant displacement.    PT Comments    Patient progressing slowly towards her goals. Requiring two person moderate assistance this session for stand pivot transfers using walker from recliner to bed side commode. Continues with significant left hip pain and left lower extremity weakness. Think patient can progress next session to attempting ambulation in room with walker and chair follow.    Follow Up Recommendations  Supervision/Assistance - 24 hour;SNF     Equipment Recommendations  None recommended by PT    Recommendations for Other Services OT consult     Precautions / Restrictions Precautions Precautions: Fall Restrictions Weight Bearing Restrictions: No Other Position/Activity Restrictions: Per. Dr. Sloan Leiter, patient is WBAT.    Mobility  Bed Mobility               General bed mobility comments: OOB in chair upon arrival. assisted RN with scooting hips back in chair for posterior displacement  Transfers Overall transfer level: Needs assistance Equipment used: Rolling walker (2 wheeled) Transfers: Sit to/from [employer] Transfers Sit to Stand: +2 physical assistance;Mod assist Stand pivot transfers: +2 physical assistance;Mod assist       General transfer comment: Patient performing x 3 sit to stands from recliner and BSC. Initially stood and able to  statically stand with trunk flexed posture in order for PT to perform peri care. Pt then had an episode of fecal incontinence and transferred via stand pivot with modA to [facility]. Displays some initiation of moving feet with decreased foot clearance. Cueing for upright posture and negotiating walker.  Ambulation/Gait                 Stairs             Wheelchair Mobility    Modified Rankin (Stroke Patients Only)       Balance     Sitting balance-Leahy Scale: Fair       Standing balance-Leahy Scale: Poor                              Cognition Arousal/Alertness: Awake/alert Behavior During Therapy: Flat affect Overall Cognitive Status: No family/caregiver present to determine baseline cognitive functioning(likely at or close to baseline) Area of Impairment: Memory;Attention;Safety/judgement;Following commands;Awareness;Problem solving                   Current Attention Level: Sustained Memory: Decreased short-term memory Following Commands: Follows multi-step commands inconsistently;Follows multi-step commands with increased time Safety/Judgement: Decreased awareness of safety;Decreased awareness of deficits Awareness: Emergent Problem Solving: Slow processing;Requires verbal cues;Requires tactile cues General Comments: Needs frequent redirection to task      Exercises General Exercises - Lower Extremity Long Arc Quad: 10 reps;Right;Seated(pt unable to elevate LLE)    General Comments        Pertinent Vitals/Pain Pain Assessment: Faces Faces Pain Scale: Hurts even more Pain Location: L hip Pain Descriptors / Indicators: Discomfort;Constant Pain Intervention(s): Monitored during session;Limited activity within patient's  tolerance    Home Living                      Prior Function            PT Goals (current goals can now be found in the care plan section) Acute Rehab PT Goals Patient Stated Goal: "feel better." PT  Goal Formulation: Patient unable to participate in goal setting Time For Goal Achievement: 11/24/17 Potential to Achieve Goals: Good Progress towards PT goals: Progressing toward goals    Frequency    Min 3X/week      PT Plan Current plan remains appropriate    Co-evaluation              AM-PAC PT "6 Clicks" Daily Activity  Outcome Measure  Difficulty turning over in bed (including adjusting bedclothes, sheets and blankets)?: A Lot Difficulty moving from lying on back to sitting on the side of the bed? : Unable Difficulty sitting down on and standing up from a chair with arms (e.g., wheelchair, bedside commode, etc,.)?: Unable Help needed moving to and from a bed to chair (including a wheelchair)?: A Lot Help needed walking in hospital room?: A Lot Help needed climbing 3-5 steps with a railing? : Total 6 Click Score: 9    End of Session Equipment Utilized During Treatment: Gait belt Activity Tolerance: Patient tolerated treatment well Patient left: with call bell/phone within reach;in chair;with chair alarm set Nurse Communication: Mobility status PT Visit Diagnosis: Unsteadiness on feet (R26.81);Other abnormalities of gait and mobility (R26.89);Muscle weakness (generalized) (M62.81);History of falling (Z91.81)     Time: 6546-5035 PT Time Calculation (min) (ACUTE ONLY): 27 min  Charges:  $Therapeutic Activity: 23-37 mins                     Ellamae Sia, PT, DPT Acute Rehabilitation Services  Pager: [phone]    Willy Eddy 11/14/2017, 2:15 PM

## 2017-11-15 ENCOUNTER — Inpatient Hospital Stay: Payer: Self-pay

## 2017-11-15 ENCOUNTER — Inpatient Hospital Stay (HOSPITAL_COMMUNITY): Payer: Medicare Other

## 2017-11-15 DIAGNOSIS — R109 Unspecified abdominal pain: Secondary | ICD-10-CM

## 2017-11-15 DIAGNOSIS — K921 Melena: Secondary | ICD-10-CM

## 2017-11-15 MED ORDER — HYDROCODONE-ACETAMINOPHEN 5-325 MG PO TABS: 0.5000 | ORAL_TABLET | Freq: Four times a day (QID) | ORAL | 0 refills | Status: DC | PRN

## 2017-11-15 MED ORDER — HEPARIN SOD (PORK) LOCK FLUSH 100 UNIT/ML IV SOLN
250.0000 [IU] | INTRAVENOUS | Status: AC | PRN
  Administered 2017-11-15: 250 [IU]

## 2017-11-15 MED ORDER — RISPERIDONE 0.5 MG PO TBDP: 0.5000 mg | ORAL_TABLET | Freq: Every evening | ORAL | 0 refills | Status: DC | PRN

## 2017-11-15 MED ORDER — AMPICILLIN IV (FOR PTA / DISCHARGE USE ONLY): 2.0000 g | Freq: Four times a day (QID) | INTRAVENOUS | 0 refills | Status: AC

## 2017-11-15 MED ORDER — SODIUM CHLORIDE 0.9% FLUSH
10.0000 mL | INTRAVENOUS | Status: DC | PRN
  Administered 2017-11-15: 10 mL
  Filled 2017-11-15: qty 40

## 2017-11-15 MED ORDER — POLYETHYLENE GLYCOL 3350 17 G PO PACK: 17.0000 g | PACK | Freq: Every day | ORAL | 0 refills | Status: DC | PRN

## 2017-11-15 NOTE — Social Work (Signed)
Clinical Social Worker facilitated patient discharge including contacting patient family and facility to confirm patient discharge plans.  Clinical information faxed to facility and family agreeable with plan.  CSW arranged ambulance transport via [facility] to [facility] (pending iv team/antibiotic infusion).  RN to call [phone] with report  prior to discharge.  Clinical Social Worker will sign off for now as social work intervention is no longer needed. Please consult [place] again if new need arises.  Alexander Mt, [place] Social Worker [phone]

## 2017-11-15 NOTE — Progress Notes (Signed)
Report called to [facility], report given to [place](supervisor).

## 2017-11-15 NOTE — Progress Notes (Signed)
Peripherally Inserted Central Catheter/Midline Placement  The IV Nurse has discussed with the patient and/or persons authorized to consent for the patient, the purpose of this procedure and the potential benefits and risks involved with this procedure.  The benefits include less needle sticks, lab draws from the catheter, and the patient may be discharged home with the catheter. Risks include, but not limited to, infection, bleeding, blood clot (thrombus formation), and puncture of an artery; nerve damage and irregular heartbeat and possibility to perform a PICC exchange if needed/ordered by physician.  Alternatives to this procedure were also discussed.  Bard Power PICC patient education guide, fact sheet on infection prevention and patient information card has been provided to patient /or left at bedside.    PICC/Midline Placement Documentation     Consent obtained via telephone with daughter   [name] 11/15/2017, 10:44 AM

## 2017-11-15 NOTE — Discharge Summary (Signed)
PATIENT DETAILS Name: Alexandra Luna Age: 84 y.o. Sex: female Date of Birth: 10/24/1926 MRN: [id number]. Admitting Physician: Lady Deutscher, MD [name], [name], MD  Admit Date: 11/08/2017 Discharge date: 11/15/2017  Recommendations for Outpatient Follow-up:  1. Follow up with PCP in 1-2 weeks 2. Last day of IV ampicillin 8/27 3. Please pull PICC line once patient completes course of IV antibiotics. 4. Please obtain labs as ordered below. 5. PCP to discuss with patient/family regarding long-term anticoagulation (see below). 6. Follow with Orthopedics in 2 weeks 7. Consider palliative care follow up at SNF  Admitted From:  ALF   Disposition: SNF   Home Health: No  Equipment/Devices: 8/16>>PICC  Discharge Condition: Stable  CODE STATUS: DNR  Diet recommendation:  Heart Healthy   Brief Summary: See H&P, Labs, Consult and Test reports for all details in brief, Patient is a 84 y.o. female with history of dementia, hypertension, DM-2, A. fib on anticoagulation with Eliquis presented to the hospital with worsening confusion (more than her usual baseline).  She was recently treated for cellulitis of her lower extremity.  Upon further evaluation, she was found to have enterococcal bacteremia.  See below for further details.  Brief Hospital Course: Acute metabolic encephalopathy: Secondary to enterococcal bacteremia-I suspect her mentation has improved and she is now back to baseline.  No family at bedside-but did speak with daughter over the phone on 8/15.  CT head on 8/10 was negative for acute abnormalities.  Enterococcal bacteremia: Continue ampicillin, TTE without any evidence of vegetation.  Repeat blood cultures on 8/13- so far.  Will place PICC line on 8/16 IV Ampicillin on discharge, stop date on 8/27-please obtain labs as recommended-see below.  Evaluated by ID during his hospital stay.  ?  Rectal bleeding: No overt bleeding apparent-hemoglobin stable.  Since  patient has a history of advanced dementia-unless patient develops overt bleeding do not think work-up is required at this time.  Cautiously continue with Eliquis.  Acute kidney injury: Resolved with supportive care, probably hemodynamically mediated in the setting of bacteremia.    Atrial fibrillation: Continue beta-blocker and verapamil and Eliquis.  Spoke with daughter at length-regarding pros/cons of anticoagulation-given frequent falls/advanced age-she will talk to the patient's PCP and decide if it is still worth continuing anticoagulation long-term.Maybe worthwhile consulting palliative care at SNF to define goals of care.  Acute fracture of the left superior and inferior pubic rami: Supportive care-discussed with orthopedics (Dr.Michael Xu)-reviewed x-ray images-supportive care, WBAT.  Daughter confirms that patient had fallen recently at ALF.Follow with Orthopedics in 2 weeks  RUQ pain: Resolved.  Abdominal exam is benign.  Gallbladder ultrasound without any acute abnormality.  Dementia: Pleasantly confused-at risk of delirium during this hospital stay.  See above regarding mental status.  Continue Zoloft  Procedures/Studies: 8/16>>PICC  Discharge Diagnoses:  Principal Problem:   Acute metabolic encephalopathy Active Problems:   History of breast cancer in female   AF (atrial fibrillation) (HCC)   Essential hypertension   Chronic anticoagulation   Generalized weakness   Blood in stool   Elevated temperature   Leukocytosis   Metabolic encephalopathy   Palliative care encounter   Enterococcal bacteremia   Dementia without behavioral disturbance   Discharge Instructions:  Activity:  WBAT  Discharge Instructions    Diet - low sodium heart healthy   Complete by:  As directed    Discharge instructions   Complete by:  As directed    Follow with Primary MD  Reymundo Poll, MD  In  1 week  Please get a complete blood count and chemistry panel checked by your Primary MD  at your next visit, and again as instructed by your Primary MD.  Get Medicines reviewed and adjusted: Please take all your medications with you for your next visit with your Primary MD  Laboratory/radiological data: Please request your Primary MD to go over all hospital tests and procedure/radiological results at the follow up, please ask your Primary MD to get all Hospital records sent to his/her office.  In some cases, they will be blood work, cultures and biopsy results pending at the time of your discharge. Please request that your primary care M.D. follows up on these results.  Also Note the following: If you experience worsening of your admission symptoms, develop shortness of breath, life threatening emergency, suicidal or homicidal thoughts you must seek medical attention immediately by calling 911 or calling your MD immediately  if symptoms less severe.  You must read complete instructions/literature along with all the possible adverse reactions/side effects for all the Medicines you take and that have been prescribed to you. Take any new Medicines after you have completely understood and accpet all the possible adverse reactions/side effects.   Do not drive when taking Pain medications or sleeping medications (Benzodaizepines)  Do not take more than prescribed Pain, Sleep and Anxiety Medications. It is not advisable to combine anxiety,sleep and pain medications without talking with your primary care practitioner  Special Instructions: If you have smoked or chewed Tobacco  in the last 2 yrs please stop smoking, stop any regular Alcohol  and or any Recreational drug use.  Wear Seat belts while driving.  Please note: You were cared for by a hospitalist during your hospital stay. Once you are discharged, your primary care physician will handle any further medical issues. Please note that NO REFILLS for any discharge medications will be authorized once you are discharged, as it is  imperative that you return to your primary care physician (or establish a relationship with a primary care physician if you do not have one) for your post hospital discharge needs so that they can reassess your need for medications and monitor your lab values.   Home infusion instructions [facility] May follow [place] Dosing Protocol; May administer Cathflo as needed to maintain patency of vascular access device.; Flushing of vascular access device: per [facility] Protocol: 0.9% NaCl pre/post medica...   Complete by:  As directed    Instructions:  May follow [place] Dosing Protocol   Instructions:  May administer Cathflo as needed to maintain patency of vascular access device.   Instructions:  Flushing of vascular access device: per [facility] Protocol: 0.9% NaCl pre/post medication administration and prn patency; Heparin 100 u/ml, 62m for implanted ports and Heparin 10u/ml, 532mfor all other central venous catheters.   Instructions:  May follow [facility] Anaphylaxis Protocol for First Dose Administration in the home: 0.9% NaCl at 25-50 ml/hr to maintain IV access for protocol meds. Epinephrine 0.3 ml IV/IM PRN and Benadryl 25-50 IV/IM PRN s/s of anaphylaxis.   Instructions:  [place] Coordinator (RN) to assist per patient IV care needs in the home PRN.   Increase activity slowly   Complete by:  As directed    Weight bearing as tolerated     Allergies as of 11/15/2017      Reactions   Influenza Vaccines Other (See Comments)   Pt's arm swelled and dr told her not to take vaccine again.  [place] [trandolapril-verapamil Hcl Er] Other (See Comments)   Increase blood pressure   Adhesive [tape] Rash   Adhesive bandaids    Azithromycin Rash   Lipitor [atorvastatin] Rash   Penicillins Rash   Has taken amoxicillin      Medication List    STOP taking these medications   cephALEXin 500 MG capsule Commonly known as:  KEFLEX   metoCLOPramide 10 MG tablet Commonly known as:   REGLAN   spironolactone 25 MG tablet Commonly known as:  ALDACTONE   traMADol 50 MG tablet Commonly known as:  ULTRAM     TAKE these medications   acetaminophen 325 MG tablet Commonly known as:  TYLENOL Take 650 mg by mouth 2 (two) times daily as needed for mild pain.   AEROCHAMBER PLUS FLO-VU MEDIUM Misc 1 each by Other route once.   albuterol 108 (90 Base) MCG/ACT inhaler Commonly known as:  PROVENTIL HFA;VENTOLIN HFA Inhale 1 puff into the lungs every 4 (four) hours as needed for wheezing or shortness of breath.   ampicillin  IVPB Inject 2 g into the vein every 6 (six) hours for 13 days. Indication:  Enterococcal bacteremia Last Day of Therapy:  11/26/17 Labs - Once weekly:  CBC/D and BMP, Labs - Every other week:  ESR and CRP   apixaban 2.5 MG Tabs tablet Commonly known as:  ELIQUIS Take 1 tablet (2.5 mg total) by mouth 2 (two) times daily.   atenolol 25 MG tablet Commonly known as:  TENORMIN Take 1 tablet (25 mg total) by mouth at bedtime.   calcium carbonate 600 MG Tabs tablet Commonly known as:  OS-CAL Take 600 mg by mouth daily.   fexofenadine 180 MG tablet Commonly known as:  ALLEGRA Take 180 mg by mouth daily.   fluticasone 50 MCG/ACT nasal spray Commonly known as:  FLONASE Place 1 spray into both nostrils at bedtime.   furosemide 20 MG tablet Commonly known as:  LASIX Take 1 tablet (20 mg total) by mouth daily.   HYDROcodone-acetaminophen 5-325 MG tablet Commonly known as:  NORCO/VICODIN Take 0.5 tablets by mouth every 6 (six) hours as needed for moderate pain. What changed:    when to take this  reasons to take this   multivitamins with iron Tabs tablet Take 1 tablet by mouth daily.   ondansetron 4 MG tablet Commonly known as:  ZOFRAN Take 1 tablet (4 mg total) by mouth every 6 (six) hours as needed for nausea.   polyethylene glycol packet Commonly known as:  MIRALAX / GLYCOLAX Take 17 g by mouth daily as needed for mild constipation.    Psyllium 500 MG Caps Take 500 mg by mouth daily.   risperiDONE 0.5 MG disintegrating tablet Commonly known as:  RISPERDAL M-TABS Take 1 tablet (0.5 mg total) by mouth at bedtime as needed (Agitation).   rosuvastatin 10 MG tablet Commonly known as:  CRESTOR Take 1 tablet (10 mg total) by mouth every other day.   senna 8.6 MG Tabs tablet Commonly known as:  SENOKOT Take 1 tablet by mouth 3 (three) times a week. Monday, Wednesday and Friday   sertraline 25 MG tablet Commonly known as:  ZOLOFT Take 25 mg by mouth at bedtime.   verapamil 240 MG 24 hr capsule Commonly known as:  VERELAN PM Take 1 capsule (240 mg total) by mouth at bedtime.            Home Infusion Instuctions  (From admission, onward)  Start     Ordered   11/15/17 0000  Home infusion instructions [facility] May follow [place] Dosing Protocol; May administer Cathflo as needed to maintain patency of vascular access device.; Flushing of vascular access device: per [facility] Protocol: 0.9% NaCl pre/post medica...    Question Answer Comment  Instructions May follow [place] Dosing Protocol   Instructions May administer Cathflo as needed to maintain patency of vascular access device.   Instructions Flushing of vascular access device: per [facility] Protocol: 0.9% NaCl pre/post medication administration and prn patency; Heparin 100 u/ml, 75m for implanted ports and Heparin 10u/ml, 545mfor all other central venous catheters.   Instructions May follow [facility] Anaphylaxis Protocol for First Dose Administration in the home: 0.9% NaCl at 25-50 ml/hr to maintain IV access for protocol meds. Epinephrine 0.3 ml IV/IM PRN and Benadryl 25-50 IV/IM PRN s/s of anaphylaxis.   Instructions [facility] Infusion Coordinator (RN) to assist per patient IV care needs in the home PRN.      11/15/17 1015          Contact information for follow-up providers    TrReymundo PollMD. Schedule an appointment as soon as possible  for a visit in 1 week(s).   Specialty:  Family Medicine Contact information: [address] [phone]            Contact information for after-discharge care    Destination    [facility]erred [facility] .   Service:  Skilled Nursing Contact information: [address] [id number] [phone]               Allergies  Allergen Reactions  . Influenza Vaccines Other (See Comments)    Pt's arm swelled and dr told her not to take vaccine again.  . [place] FleetingTrandolapril-Verapamil Hcl Er] Other (See Comments)    Increase blood pressure  . Adhesive [Tape] Rash    Adhesive bandaids   . Azithromycin Rash  . Lipitor [Atorvastatin] Rash  . Penicillins Rash    Has taken amoxicillin    Consultations:   ID  Other Procedures/Studies: Dg Chest 2 View  Result Date: 11/09/2017 CLINICAL DATA:  Hypoxia. EXAM: CHEST - 2 VIEW COMPARISON:  November 08, 2017 FINDINGS: Cardiomegaly. No pneumothorax. No pulmonary nodules, masses, or focal infiltrates. IMPRESSION: No active cardiopulmonary disease. Electronically Signed   By: DaDorise BullionII M.D   On: 11/09/2017 10:07   Dg Chest 2 View  Result Date: 11/08/2017 CLINICAL DATA:  Shortness of breath.  History of atrial fibrillation EXAM: CHEST - 2 VIEW COMPARISON:  November 01, 2016 FINDINGS: Lungs are clear. There is moderate cardiomegaly with pulmonary vascularity normal. No adenopathy. There is aortic atherosclerosis. There is postoperative change in the left axillary region. Patient has had previous partial mastectomy on the left. IMPRESSION: No edema or consolidation. There is cardiomegaly. There is aortic atherosclerosis. Postoperative change left breast region with surgical clips left axillary region. No evident adenopathy. Aortic Atherosclerosis (ICD10-I70.0). Electronically Signed   By: WiLowella GripII M.D.   On: 11/08/2017 10:04   Ct Head Wo Contrast  Result Date:  11/09/2017 CLINICAL DATA:  Encephalopathy. EXAM: CT HEAD WITHOUT CONTRAST TECHNIQUE: Contiguous axial images were obtained from the base of the skull through the vertex without intravenous contrast. COMPARISON:  CT head without contrast 05/28/2017. FINDINGS: Brain: Mild atrophy and white matter changes bilaterally are stable. A remote lacunar infarct in the posterior limb  of the right internal capsule is stable. A remote lacunar infarct involving the inferior right cerebellum is stable. No acute infarct, hemorrhage, or mass lesion is present. The ventricles are proportionate to the degree of atrophy. No significant extra-axial fluid collection is present. Vascular: Atherosclerotic calcifications are present within the cavernous internal carotid arteries bilaterally. There is no hyperdense vessel. Skull: The skull base is within normal limits. The craniocervical junction is normal. Hyperostosis front turn is talus is again noted. Sinuses/Orbits: The paranasal sinuses and mastoid air cells are clear. Bilateral lens replacements are present. Globes and orbits are within normal limits otherwise. IMPRESSION: 1. Acute intracranial abnormality or significant interval change. 2. Stable atrophy and white matter disease. This likely reflects the sequela of chronic microvascular ischemia. Electronically Signed   By: San Morelle M.D.   On: 11/09/2017 20:47   US Abdomen Complete  Result Date: 11/12/2017 CLINICAL DATA:  Abdominal pain EXAM: ABDOMEN ULTRASOUND COMPLETE COMPARISON:  CT scan 03/20/2015 FINDINGS: Gallbladder: No gallstones or gallbladder wall thickening. Sludge noted in the lumen. No pericholecystic fluid. The sonographer reports no sonographic Murphy's sign. Common bile duct: Diameter: 3 4 mm Liver: No focal lesion identified. Within normal limits in parenchymal echogenicity. Portal vein is patent on color Doppler imaging with normal direction of blood flow towards the liver. IVC: No abnormality  visualized. Pancreas: Visualized portion unremarkable. Spleen: Size and appearance within normal limits. Right Kidney: Length: 11.7 cm. Echogenicity within normal limits. No mass or hydronephrosis visualized. Left Kidney: Length: 11.9 cm. Echogenicity within normal limits. No mass or hydronephrosis visualized. Abdominal aorta: No aneurysm visualized. Other findings: None. IMPRESSION: No sonographic findings to explain the patient's history of pain. Electronically Signed   By: Misty Stanley M.D.   On: 11/12/2017 12:49   US Renal  Result Date: 11/09/2017 CLINICAL DATA:  Acute renal failure. EXAM: RENAL / URINARY TRACT ULTRASOUND COMPLETE COMPARISON:  CT scan March 20, 2015 FINDINGS: Right Kidney: Length: 10.9 cm. Echogenicity within normal limits. No mass or hydronephrosis visualized. Left Kidney: Length: 11.4 cm. Mildly limited evaluation due to body habitus. No abnormalities seen. Bladder: Appears normal for degree of bladder distention. IMPRESSION: Mildly limit evaluation of the left kidney due to body habitus. No abnormalities identified to explain the patient's acute renal failure. Electronically Signed   By: Dorise Bullion III M.D   On: 11/09/2017 10:07   Dg Foot Complete Left  Result Date: 11/02/2017 CLINICAL DATA:  Toe ulcer EXAM: LEFT FOOT - COMPLETE 3+ VIEW COMPARISON:  None. FINDINGS: No definite fracture or dislocation, though note, evaluation of great toe is degraded due to obliquity. Joint spaces appear grossly preserved. No definite erosions. No significant hallux valgus deformity. Tiny plantar calcaneal spur. Minimal enthesopathic change involving the Achilles tendon insertion site. Regional soft tissues appear normal. No discrete areas of subcutaneous emphysema. No discrete areas of osteolysis to suggest osteomyelitis. IMPRESSION: 1. No definite fracture or dislocation. 2. No radiopaque foreign body or discrete areas of osteolysis to suggest osteomyelitis. Electronically Signed   By: Sandi Mariscal M.D.   On: 11/02/2017 14:18   Dg Hip Unilat With Pelvis 2-3 Views Left  Result Date: 11/12/2017 CLINICAL DATA:  Pt c/o posterior left hip pain; no known injury to the area. No hx of prior injuries or surgeries. EXAM: DG HIP (WITH OR WITHOUT PELVIS) 2-3V LEFT COMPARISON:  None. FINDINGS: There are acute appearing fractures the left superior pubic ramus where it joins the pubic symphysis, in the inferior left pubic ramus. The superior fracture  is mildly comminuted. There is no significant displacement. No other convincing fractures.  No bone lesions. There is concentric left hip joint space narrowing. Small marginal osteophytes project from the base of the left femoral head. Right hip joint is normally spaced and aligned. SI joints and symphysis pubis are normally aligned. Bones are demineralized. Surrounding soft tissues are unremarkable. IMPRESSION: 1. Acute fractures of the left superior and inferior pubic rami, the superior fracture extending to the pubic symphysis. No significant displacement. Electronically Signed   By: Lajean Manes M.D.   On: 11/12/2017 15:20   [place] Ekg Site Rite  Result Date: 11/15/2017 If Site Rite image not attached, placement could not be confirmed due to current cardiac rhythm.     TODAY-DAY OF DISCHARGE:  Subjective:   Alexandra Luna today remains pleasantly confused.  Objective:   Blood pressure 107/71, pulse 67, temperature 98.3 F (36.8 C), temperature source Oral, resp. rate 20, height '5\' 8"'$  (1.727 m), weight 86.3 kg, SpO2 97 %.  Intake/Output Summary (Last 24 hours) at 11/15/2017 1016 Last data filed at 11/14/2017 2117 Gross per 24 hour  Intake 1806.02 ml  Output -  Net 1806.02 ml   Filed Weights   11/12/17 1101 11/13/17 0537 11/14/17 1453  Weight: 84.5 kg 83.9 kg 86.3 kg    Exam: Awake pleasantly confused, No new F.N deficits, Normal affect [place].AT,PERRAL Supple Neck,No JVD, No cervical lymphadenopathy appriciated.  Symmetrical Chest wall  movement, Good air movement bilaterally, CTAB RRR,No Gallops,Rubs or new Murmurs, No Parasternal Heave +ve B.Sounds, Abd Soft, Non tender, No organomegaly appriciated, No rebound -guarding or rigidity. No Cyanosis, Clubbing or edema, No new Rash or bruise   PERTINENT RADIOLOGIC STUDIES: Dg Chest 2 View  Result Date: 11/09/2017 CLINICAL DATA:  Hypoxia. EXAM: CHEST - 2 VIEW COMPARISON:  November 08, 2017 FINDINGS: Cardiomegaly. No pneumothorax. No pulmonary nodules, masses, or focal infiltrates. IMPRESSION: No active cardiopulmonary disease. Electronically Signed   By: Dorise Bullion III M.D   On: 11/09/2017 10:07   Dg Chest 2 View  Result Date: 11/08/2017 CLINICAL DATA:  Shortness of breath.  History of atrial fibrillation EXAM: CHEST - 2 VIEW COMPARISON:  November 01, 2016 FINDINGS: Lungs are clear. There is moderate cardiomegaly with pulmonary vascularity normal. No adenopathy. There is aortic atherosclerosis. There is postoperative change in the left axillary region. Patient has had previous partial mastectomy on the left. IMPRESSION: No edema or consolidation. There is cardiomegaly. There is aortic atherosclerosis. Postoperative change left breast region with surgical clips left axillary region. No evident adenopathy. Aortic Atherosclerosis (ICD10-I70.0). Electronically Signed   By: Lowella Grip III M.D.   On: 11/08/2017 10:04   Ct Head Wo Contrast  Result Date: 11/09/2017 CLINICAL DATA:  Encephalopathy. EXAM: CT HEAD WITHOUT CONTRAST TECHNIQUE: Contiguous axial images were obtained from the base of the skull through the vertex without intravenous contrast. COMPARISON:  CT head without contrast 05/28/2017. FINDINGS: Brain: Mild atrophy and white matter changes bilaterally are stable. A remote lacunar infarct in the posterior limb of the right internal capsule is stable. A remote lacunar infarct involving the inferior right cerebellum is stable. No acute infarct, hemorrhage, or mass lesion is  present. The ventricles are proportionate to the degree of atrophy. No significant extra-axial fluid collection is present. Vascular: Atherosclerotic calcifications are present within the cavernous internal carotid arteries bilaterally. There is no hyperdense vessel. Skull: The skull base is within normal limits. The craniocervical junction is normal. Hyperostosis front turn is talus is again noted. Sinuses/Orbits:  The paranasal sinuses and mastoid air cells are clear. Bilateral lens replacements are present. Globes and orbits are within normal limits otherwise. IMPRESSION: 1. Acute intracranial abnormality or significant interval change. 2. Stable atrophy and white matter disease. This likely reflects the sequela of chronic microvascular ischemia. Electronically Signed   By: San Morelle M.D.   On: 11/09/2017 20:47   US Abdomen Complete  Result Date: 11/12/2017 CLINICAL DATA:  Abdominal pain EXAM: ABDOMEN ULTRASOUND COMPLETE COMPARISON:  CT scan 03/20/2015 FINDINGS: Gallbladder: No gallstones or gallbladder wall thickening. Sludge noted in the lumen. No pericholecystic fluid. The sonographer reports no sonographic Murphy's sign. Common bile duct: Diameter: 3 4 mm Liver: No focal lesion identified. Within normal limits in parenchymal echogenicity. Portal vein is patent on color Doppler imaging with normal direction of blood flow towards the liver. IVC: No abnormality visualized. Pancreas: Visualized portion unremarkable. Spleen: Size and appearance within normal limits. Right Kidney: Length: 11.7 cm. Echogenicity within normal limits. No mass or hydronephrosis visualized. Left Kidney: Length: 11.9 cm. Echogenicity within normal limits. No mass or hydronephrosis visualized. Abdominal aorta: No aneurysm visualized. Other findings: None. IMPRESSION: No sonographic findings to explain the patient's history of pain. Electronically Signed   By: Misty Stanley M.D.   On: 11/12/2017 12:49   US Renal  Result  Date: 11/09/2017 CLINICAL DATA:  Acute renal failure. EXAM: RENAL / URINARY TRACT ULTRASOUND COMPLETE COMPARISON:  CT scan March 20, 2015 FINDINGS: Right Kidney: Length: 10.9 cm. Echogenicity within normal limits. No mass or hydronephrosis visualized. Left Kidney: Length: 11.4 cm. Mildly limited evaluation due to body habitus. No abnormalities seen. Bladder: Appears normal for degree of bladder distention. IMPRESSION: Mildly limit evaluation of the left kidney due to body habitus. No abnormalities identified to explain the patient's acute renal failure. Electronically Signed   By: Dorise Bullion III M.D   On: 11/09/2017 10:07   Dg Foot Complete Left  Result Date: 11/02/2017 CLINICAL DATA:  Toe ulcer EXAM: LEFT FOOT - COMPLETE 3+ VIEW COMPARISON:  None. FINDINGS: No definite fracture or dislocation, though note, evaluation of great toe is degraded due to obliquity. Joint spaces appear grossly preserved. No definite erosions. No significant hallux valgus deformity. Tiny plantar calcaneal spur. Minimal enthesopathic change involving the Achilles tendon insertion site. Regional soft tissues appear normal. No discrete areas of subcutaneous emphysema. No discrete areas of osteolysis to suggest osteomyelitis. IMPRESSION: 1. No definite fracture or dislocation. 2. No radiopaque foreign body or discrete areas of osteolysis to suggest osteomyelitis. Electronically Signed   By: Sandi Mariscal M.D.   On: 11/02/2017 14:18   Dg Hip Unilat With Pelvis 2-3 Views Left  Result Date: 11/12/2017 CLINICAL DATA:  Pt c/o posterior left hip pain; no known injury to the area. No hx of prior injuries or surgeries. EXAM: DG HIP (WITH OR WITHOUT PELVIS) 2-3V LEFT COMPARISON:  None. FINDINGS: There are acute appearing fractures the left superior pubic ramus where it joins the pubic symphysis, in the inferior left pubic ramus. The superior fracture is mildly comminuted. There is no significant displacement. No other convincing fractures.   No bone lesions. There is concentric left hip joint space narrowing. Small marginal osteophytes project from the base of the left femoral head. Right hip joint is normally spaced and aligned. SI joints and symphysis pubis are normally aligned. Bones are demineralized. Surrounding soft tissues are unremarkable. IMPRESSION: 1. Acute fractures of the left superior and inferior pubic rami, the superior fracture extending to the pubic symphysis. No significant  displacement. Electronically Signed   By: Lajean Manes M.D.   On: 11/12/2017 15:20   [place] Ekg Site Rite  Result Date: 11/15/2017 If Site Rite image not attached, placement could not be confirmed due to current cardiac rhythm.    PERTINENT LAB RESULTS: CBC: No results for input(s): WBC, HGB, HCT, PLT in the last 72 hours. CMET CMP     Component Value Date/Time   NA 135 11/12/2017 0602   NA 141 05/08/2013 0950   K 4.5 11/12/2017 0602   K 4.0 05/08/2013 0950   CL 104 11/12/2017 0602   CL 102 04/21/2012 0925   CO2 20 (L) 11/12/2017 0602   CO2 31 (H) 05/08/2013 0950   GLUCOSE 95 11/12/2017 0602   GLUCOSE 110 05/08/2013 0950   GLUCOSE 125 (H) 04/21/2012 0925   BUN 36 (H) 11/12/2017 0602   BUN 17.2 05/08/2013 0950   CREATININE 0.97 11/12/2017 0602   CREATININE 0.8 05/08/2013 0950   CALCIUM 8.8 (L) 11/12/2017 0602   CALCIUM 10.7 (H) 05/08/2013 0950   PROT 6.7 11/08/2017 0957   PROT 6.8 05/08/2013 0950   ALBUMIN 3.3 (L) 11/08/2017 0957   ALBUMIN 3.8 05/08/2013 0950   AST 32 11/08/2017 0957   AST 18 05/08/2013 0950   ALT 24 11/08/2017 0957   ALT 16 05/08/2013 0950   ALKPHOS 89 11/08/2017 0957   ALKPHOS 66 05/08/2013 0950   BILITOT 1.6 (H) 11/08/2017 0957   BILITOT 0.54 05/08/2013 0950   GFRNONAA 51 (L) 11/12/2017 0602   GFRAA 59 (L) 11/12/2017 0602    GFR Estimated Creatinine Clearance: 46.1 mL/min (by C-G formula based on SCr of 0.97 mg/dL). No results for input(s): LIPASE, AMYLASE in the last 72 hours. No results for  input(s): CKTOTAL, CKMB, CKMBINDEX, TROPONINI in the last 72 hours. Invalid input(s): POCBNP No results for input(s): DDIMER in the last 72 hours. No results for input(s): HGBA1C in the last 72 hours. No results for input(s): CHOL, HDL, LDLCALC, TRIG, CHOLHDL, LDLDIRECT in the last 72 hours. No results for input(s): TSH, T4TOTAL, T3FREE, THYROIDAB in the last 72 hours.  Invalid input(s): FREET3 No results for input(s): VITAMINB12, FOLATE, FERRITIN, TIBC, IRON, RETICCTPCT in the last 72 hours. Coags: No results for input(s): INR in the last 72 hours.  Invalid input(s): PT Microbiology: Recent Results (from the past 240 hour(s))  MRSA PCR Screening     Status: Abnormal   Collection Time: 11/08/17  3:54 PM  Result Value Ref Range Status   MRSA by PCR POSITIVE (A) NEGATIVE Final    Comment:        The GeneXpert MRSA Assay (FDA approved for NASAL specimens only), is one component of a comprehensive MRSA colonization surveillance program. It is not intended to diagnose MRSA infection nor to guide or monitor treatment for MRSA infections. RESULT CALLED TO, READ BACK BY AND VERIFIED WITH: Marjory Sneddon RN 11/08/17 1838 JDW Performed at [facility], [place] [address] [place], [place] [address]   Urine Culture     Status: None   Collection Time: 11/09/17  4:35 PM  Result Value Ref Range Status   Specimen Description URINE, CATHETERIZED  Final   Special Requests NONE  Final   Culture   Final    NO GROWTH Performed at [facility], [address], [place], [place] [address]    Report Status 11/10/2017 FINAL  Final  Culture, blood (Routine X 2) w Reflex to ID Panel     Status: Abnormal  Collection Time: 11/09/17  6:42 PM  Result Value Ref Range Status   Specimen Description BLOOD LEFT ANTECUBITAL  Final   Special Requests   Final    BOTTLES DRAWN AEROBIC ONLY Blood Culture adequate volume   Culture  Setup Time   Final    GRAM POSITIVE COCCI IN PAIRS AND CHAINS AEROBIC BOTTLE  ONLY CRITICAL RESULT CALLED TO, READ BACK BY AND VERIFIED WITH: R. RUMBARGER, RPHARMD AT 1250 ON 11/10/17 BY C. JESSUP, MLT. Performed at [facility], [place] [address] [place], [place] [address]    Culture ENTEROCOCCUS FAECALIS (A)  Final   Report Status 11/12/2017 FINAL  Final   Organism ID, Bacteria ENTEROCOCCUS FAECALIS  Final      Susceptibility   Enterococcus faecalis - MIC*    AMPICILLIN <=2 SENSITIVE Sensitive     VANCOMYCIN 1 SENSITIVE Sensitive     GENTAMICIN SYNERGY SENSITIVE Sensitive     * ENTEROCOCCUS FAECALIS  Blood Culture ID Panel (Reflexed)     Status: Abnormal   Collection Time: 11/09/17  6:42 PM  Result Value Ref Range Status   Enterococcus species DETECTED (A) NOT DETECTED Final    Comment: CRITICAL RESULT CALLED TO, READ BACK BY AND VERIFIED WITH: R. RUMBARGER, RPHARMD AT 1250 ON 11/10/17 BY C. JESSUP, MLT.    Vancomycin resistance NOT DETECTED NOT DETECTED Final   Listeria monocytogenes NOT DETECTED NOT DETECTED Final   Staphylococcus species NOT DETECTED NOT DETECTED Final   Staphylococcus aureus NOT DETECTED NOT DETECTED Final   Streptococcus species NOT DETECTED NOT DETECTED Final   Streptococcus agalactiae NOT DETECTED NOT DETECTED Final   Streptococcus pneumoniae NOT DETECTED NOT DETECTED Final   Streptococcus pyogenes NOT DETECTED NOT DETECTED Final   Acinetobacter baumannii NOT DETECTED NOT DETECTED Final   Enterobacteriaceae species NOT DETECTED NOT DETECTED Final   Enterobacter cloacae complex NOT DETECTED NOT DETECTED Final   Escherichia coli NOT DETECTED NOT DETECTED Final   Klebsiella oxytoca NOT DETECTED NOT DETECTED Final   Klebsiella pneumoniae NOT DETECTED NOT DETECTED Final   Proteus species NOT DETECTED NOT DETECTED Final   Serratia marcescens NOT DETECTED NOT DETECTED Final   Haemophilus influenzae NOT DETECTED NOT DETECTED Final   Neisseria meningitidis NOT DETECTED NOT DETECTED Final   Pseudomonas aeruginosa NOT DETECTED NOT  DETECTED Final   Candida albicans NOT DETECTED NOT DETECTED Final   Candida glabrata NOT DETECTED NOT DETECTED Final   Candida krusei NOT DETECTED NOT DETECTED Final   Candida parapsilosis NOT DETECTED NOT DETECTED Final   Candida tropicalis NOT DETECTED NOT DETECTED Final    Comment: Performed at [facility], [place] [address] [place], [place] [address]  Culture, blood (Routine X 2) w Reflex to ID Panel     Status: Abnormal   Collection Time: 11/09/17  6:50 PM  Result Value Ref Range Status   Specimen Description BLOOD RIGHT HAND  Final   Special Requests   Final    BOTTLES DRAWN AEROBIC AND ANAEROBIC Blood Culture results may not be optimal due to an inadequate volume of blood received in culture bottles   Culture  Setup Time   Final    GRAM POSITIVE COCCI AEROBIC BOTTLE ONLY CRITICAL RESULT CALLED TO, READ BACK BY AND VERIFIED WITH: R. RUMBARGER, RPHARMD AT 1250 ON 11/10/17 BY C. JESSUP, MLT.    Culture (A)  Final    ENTEROCOCCUS FAECALIS SUSCEPTIBILITIES PERFORMED ON PREVIOUS CULTURE WITHIN THE LAST 5 DAYS. Performed at [facility],  [address] [place], [place] [address]    Report Status 11/12/2017 FINAL  Final  Culture, blood (routine x 2)     Status: None (Preliminary result)   Collection Time: 11/12/17  6:02 AM  Result Value Ref Range Status   Specimen Description BLOOD RIGHT ANTECUBITAL  Final   Special Requests   Final    BOTTLES DRAWN AEROBIC ONLY Blood Culture results may not be optimal due to an inadequate volume of blood received in culture bottles   Culture   Final    NO GROWTH 2 DAYS Performed at [facility], [place] [address], [place], [place] [address]    Report Status PENDING  Incomplete  Culture, blood (routine x 2)     Status: None (Preliminary result)   Collection Time: 11/12/17  6:13 AM  Result Value Ref Range Status   Specimen Description BLOOD RIGHT HAND  Final   Special Requests   Final    BOTTLES DRAWN AEROBIC ONLY Blood Culture  results may not be optimal due to an inadequate volume of blood received in culture bottles   Culture   Final    NO GROWTH 2 DAYS Performed at [facility], [place] [address] [place], [place] [address]    Report Status PENDING  Incomplete    FURTHER DISCHARGE INSTRUCTIONS:  Get Medicines reviewed and adjusted: Please take all your medications with you for your next visit with your Primary MD  Laboratory/radiological data: Please request your Primary MD to go over all hospital tests and procedure/radiological results at the follow up, please ask your Primary MD to get all Hospital records sent to his/her office.  In some cases, they will be blood work, cultures and biopsy results pending at the time of your discharge. Please request that your primary care M.D. goes through all the records of your hospital data and follows up on these results.  Also Note the following: If you experience worsening of your admission symptoms, develop shortness of breath, life threatening emergency, suicidal or homicidal thoughts you must seek medical attention immediately by calling 911 or calling your MD immediately  if symptoms less severe.  You must read complete instructions/literature along with all the possible adverse reactions/side effects for all the Medicines you take and that have been prescribed to you. Take any new Medicines after you have completely understood and accpet all the possible adverse reactions/side effects.   Do not drive when taking Pain medications or sleeping medications (Benzodaizepines)  Do not take more than prescribed Pain, Sleep and Anxiety Medications. It is not advisable to combine anxiety,sleep and pain medications without talking with your primary care practitioner  Special Instructions: If you have smoked or chewed Tobacco  in the last 2 yrs please stop smoking, stop any regular Alcohol  and or any Recreational drug use.  Wear Seat belts while driving.  Please  note: You were cared for by a hospitalist during your hospital stay. Once you are discharged, your primary care physician will handle any further medical issues. Please note that NO REFILLS for any discharge medications will be authorized once you are discharged, as it is imperative that you return to your primary care physician (or establish a relationship with a primary care physician if you do not have one) for your post hospital discharge needs so that they can reassess your need for medications and monitor your lab values.  Total Time spent coordinating discharge including counseling, education and face to face time equals 45  minutes.  SignedOren Binet 11/15/2017 10:16 AM

## 2017-11-15 NOTE — Social Work (Signed)
CSW acknowledging pt medical readiness for discharge now that picc has been placed. Await insurance authorization through [facility].   Alexander Mt, [place] Work [phone]

## 2017-11-15 NOTE — Clinical Social Work Placement (Signed)
   CLINICAL SOCIAL WORK PLACEMENT  NOTE [facility] RN to call report to [phone]   Date:  11/15/2017  Patient Details  Name: Alexandra Luna MRN: [id number] Date of Birth: Sep 12, 1926  Clinical Social Work is seeking post-discharge placement for this patient at the [place] level of care (*CSW will initial, date and re-position this form in  chart as items are completed):  Yes   Patient/family provided with [place] Work Department's list of facilities offering this level of care within the geographic area requested by the patient (or if unable, by the patient's family).  Yes   Patient/family informed of their freedom to choose among providers that offer the needed level of care, that participate in Medicare, Medicaid or managed care program needed by the patient, have an available bed and are willing to accept the patient.  Yes   Patient/family informed of [facility]'s ownership interest in [facility] and [facility], as well as of the fact that they are under no obligation to receive care at these facilities.  PASRR submitted to EDS on       PASRR number received on       Existing PASRR number confirmed on 11/12/17     FL2 transmitted to all facilities in geographic area requested by pt/family on 11/12/17     FL2 transmitted to all facilities within larger geographic area on       Patient informed that his/her managed care company has contracts with or will negotiate with certain facilities, including the following:        Yes   Patient/family informed of bed offers received.  Patient [facility] bed at [facility]     Physician recommends and patient chooses bed at      Patient to be transferred to [facility] on  .  Patient to be transferred to facility by [facility]     Patient family notified on 11/15/17 of transfer.  Name of family member notified:  pt daughter Alexandra Luna     PHYSICIAN Please prepare priority discharge summary,  including medications, Please sign DNR     Additional Comment:    _______________________________________________ Alexander Mt, LCSWA 11/15/2017, 2:01 PM

## 2017-11-17 LAB — CULTURE, BLOOD (ROUTINE X 2)
CULTURE: NO GROWTH
Culture: NO GROWTH

## 2017-12-04 ENCOUNTER — Ambulatory Visit (INDEPENDENT_AMBULATORY_CARE_PROVIDER_SITE_OTHER): Payer: Medicare Other | Admitting: Orthopaedic Surgery

## 2017-12-06 ENCOUNTER — Encounter (INDEPENDENT_AMBULATORY_CARE_PROVIDER_SITE_OTHER): Payer: Self-pay | Admitting: Orthopaedic Surgery

## 2017-12-06 ENCOUNTER — Ambulatory Visit (INDEPENDENT_AMBULATORY_CARE_PROVIDER_SITE_OTHER): Payer: Medicare Other | Admitting: Orthopaedic Surgery

## 2017-12-06 DIAGNOSIS — S32592A Other specified fracture of left pubis, initial encounter for closed fracture: Secondary | ICD-10-CM

## 2017-12-06 NOTE — Progress Notes (Signed)
Office Visit Note   Patient: Alexandra Luna           Date of Birth: [birth year]           MRN: [id number] Visit Date: 12/06/2017              Requested by: Reymundo Poll, MD [place] [place], [place] [address] PCP: Reymundo Poll, MD   Assessment & Plan: Visit Diagnoses:  1. Fracture of multiple pubic rami, left, closed, initial encounter ([place])     Plan: Overall she is improving clinically and has less pain.  I discussed with the daughter that her to continue to improve over the next 4 to 8 weeks.  They have instructed to follow-up if she does not significantly improve.  All parties in agreement.  Questions encouraged and answered.  Follow-up as needed.  Follow-Up Instructions: Return if symptoms worsen or fail to improve.   Orders:  No orders of the defined types were placed in this encounter.  No orders of the defined types were placed in this encounter.     Procedures: No procedures performed   Clinical Data: No additional findings.   Subjective: Chief Complaint  Patient presents with  . Left Hip - Pelvic Pain, Pain    Alexandra Luna is a 84 year old nonambulatory at baseline who comes in for follow-up of her pubic rami fractures that she sustained approximately 1 month ago.  Over the better in terms of pain.  She is able to better stay in a walker.  She letter was permanently at a rehab facility.  Denies any numbness and tingling.   Review of Systems  Constitutional: Negative.   HENT: Negative.   Eyes: Negative.   Respiratory: Negative.   Cardiovascular: Negative.   Endocrine: Negative.   Musculoskeletal: Negative.   Neurological: Negative.   Hematological: Negative.   Psychiatric/Behavioral: Negative.   All other systems reviewed and are negative.    Objective: Vital Signs: There were no vitals taken for this visit.  Physical Exam  Constitutional: She is oriented to person, place, and time. She appears well-developed and well-nourished.    HENT:  Head: Normocephalic and atraumatic.  Eyes: EOM are normal.  Neck: Neck supple.  Pulmonary/Chest: Effort normal.  Abdominal: Soft.  Neurological: She is alert and oriented to person, place, and time.  Skin: Skin is warm. Capillary refill takes less than 2 seconds.  Psychiatric: She has a normal mood and affect. Her behavior is normal. Judgment and thought content normal.  Nursing note and vitals reviewed.   Ortho Exam Pelvic exam and left hip exam is unremarkable. Specialty Comments:  No specialty comments available.  Imaging: No results found.   PMFS History: Patient Active Problem List   Diagnosis Date Noted  . Dementia without behavioral disturbance   . Enterococcal bacteremia 11/10/2017  . Palliative care encounter   . Blood in stool 11/08/2017  . Elevated temperature 11/08/2017  . Leukocytosis 11/08/2017  . Acute metabolic encephalopathy 89/21/1941  . Metabolic encephalopathy 74/11/1446  . CHF exacerbation ([place]) 11/01/2016  . Chronic diastolic CHF (congestive heart failure) ([place]) 09/06/2016  . ARF (acute renal failure) ([place]) 09/06/2016  . Weakness generalized 09/06/2016  . COPD exacerbation ([place]) 04/01/2015  . Bilateral pleural effusion 08/30/2014  . Multiple pulmonary nodules 08/30/2014  . Acute diastolic congestive heart failure ([place]) 08/14/2014  . Acute respiratory failure with hypoxia ([place]) 08/14/2014  . Generalized weakness 06/28/2014  . Chronic anticoagulation 12/08/2012  . AF (atrial fibrillation) ([place]) 09/22/2012  .  Essential hypertension 09/22/2012  . Hyperlipidemia, mixed 09/22/2012  . History of breast cancer in female 04/28/2012   Past Medical History:  Diagnosis Date  . Anxiety   . Atrial fibrillation (HCC)    On chronic Eliquis  . Breast cancer ([place]) dx'd 1997   left; lt lumpectomy and XRT  . Mixed hyperlipidemia   . Palpitations   . Pre-diabetes   . Renal hypertension 03/07/2005   no evidence of significant diameter reduction,  dissection, tortuosity, FMD or orther vascular abnormality; kidneys equal in size, symmetry, with normal cortex and medulla    Family History  Problem Relation Age of Onset  . Heart failure Mother   . Breast cancer Mother   . Hypertension Father   . Kidney cancer Father   . Stroke Maternal Grandmother     Past Surgical History:  Procedure Laterality Date  . BREAST LUMPECTOMY    . BREAST LUMPECTOMY  1998   left side  . CARDIOVASCULAR STRESS TEST  526/2011   R/Dipyridamole - EF 71%; normal myocaridal perfusion w/o evidence of ischemia or infarct; no schintigraphic evidence of inducible myocaridal ischemia; global LV function normal; EKG negative for ischemia; low risk scan   . CARDIOVERSION N/A 11/12/2012   Procedure: CARDIOVERSION;  Surgeon: Troy Sine, MD;  Location: [facility];  Service: Cardiovascular;  Laterality: N/A;  . CESAREAN SECTION     x 3  . DOPPLER ECHOCARDIOGRAPHY  08/29/2011   EF >25%;  LV systolic fcn normal; doppler flow pattern suggestive of impaired LV relaxation; LA mildly dilated; aortic valve mildly sclerotic, no aortic stenosis  . tonsillectomy     Social History   Occupational History  . Occupation: Retired Pharmacist, hospital  Tobacco Use  . Smoking status: Former Smoker    Packs/day: 0.75    Years: 10.00    Pack years: 7.50    Types: Cigarettes    Last attempt to quit: 04/02/1988    Years since quitting: 29.6  . Smokeless tobacco: Never Used  Substance and Sexual Activity  . Alcohol use: Yes    Alcohol/week: 0.0 standard drinks    Comment: 1/4 glass of wine every 2 weeks  . Drug use: No  . Sexual activity: Not on file

## 2017-12-26 ENCOUNTER — Inpatient Hospital Stay (HOSPITAL_COMMUNITY)
Admission: EM | Admit: 2017-12-26 | Discharge: 2017-12-31 | DRG: 194 | Disposition: A | Discharge status: Hospice | Payer: Medicare Other | Attending: Internal Medicine | Admitting: Internal Medicine

## 2017-12-26 ENCOUNTER — Encounter (HOSPITAL_COMMUNITY): Payer: Self-pay

## 2017-12-26 ENCOUNTER — Emergency Department (HOSPITAL_COMMUNITY): Payer: Medicare Other

## 2017-12-26 ENCOUNTER — Other Ambulatory Visit: Payer: Self-pay

## 2017-12-26 DIAGNOSIS — R402143 Coma scale, eyes open, spontaneous, at hospital admission: Secondary | ICD-10-CM | POA: Diagnosis present

## 2017-12-26 DIAGNOSIS — L98499 Non-pressure chronic ulcer of skin of other sites with unspecified severity: Secondary | ICD-10-CM

## 2017-12-26 DIAGNOSIS — J9611 Chronic respiratory failure with hypoxia: Secondary | ICD-10-CM | POA: Diagnosis present

## 2017-12-26 DIAGNOSIS — Z792 Long term (current) use of antibiotics: Secondary | ICD-10-CM

## 2017-12-26 DIAGNOSIS — Z853 Personal history of malignant neoplasm of breast: Secondary | ICD-10-CM | POA: Diagnosis not present

## 2017-12-26 DIAGNOSIS — L8990 Pressure ulcer of unspecified site, unspecified stage: Secondary | ICD-10-CM

## 2017-12-26 DIAGNOSIS — I5032 Chronic diastolic (congestive) heart failure: Secondary | ICD-10-CM | POA: Diagnosis present

## 2017-12-26 DIAGNOSIS — B9620 Unspecified Escherichia coli [E. coli] as the cause of diseases classified elsewhere: Secondary | ICD-10-CM | POA: Diagnosis present

## 2017-12-26 DIAGNOSIS — N189 Chronic kidney disease, unspecified: Secondary | ICD-10-CM | POA: Diagnosis present

## 2017-12-26 DIAGNOSIS — Z7401 Bed confinement status: Secondary | ICD-10-CM | POA: Diagnosis not present

## 2017-12-26 DIAGNOSIS — K625 Hemorrhage of anus and rectum: Secondary | ICD-10-CM

## 2017-12-26 DIAGNOSIS — Z7901 Long term (current) use of anticoagulants: Secondary | ICD-10-CM | POA: Diagnosis not present

## 2017-12-26 DIAGNOSIS — I4891 Unspecified atrial fibrillation: Secondary | ICD-10-CM | POA: Diagnosis not present

## 2017-12-26 DIAGNOSIS — J189 Pneumonia, unspecified organism: Secondary | ICD-10-CM | POA: Diagnosis present

## 2017-12-26 DIAGNOSIS — E782 Mixed hyperlipidemia: Secondary | ICD-10-CM | POA: Diagnosis present

## 2017-12-26 DIAGNOSIS — F419 Anxiety disorder, unspecified: Secondary | ICD-10-CM | POA: Diagnosis present

## 2017-12-26 DIAGNOSIS — Z79899 Other long term (current) drug therapy: Secondary | ICD-10-CM

## 2017-12-26 DIAGNOSIS — N3000 Acute cystitis without hematuria: Secondary | ICD-10-CM | POA: Diagnosis present

## 2017-12-26 DIAGNOSIS — I130 Hypertensive heart and chronic kidney disease with heart failure and stage 1 through stage 4 chronic kidney disease, or unspecified chronic kidney disease: Secondary | ICD-10-CM | POA: Diagnosis present

## 2017-12-26 DIAGNOSIS — Z87891 Personal history of nicotine dependence: Secondary | ICD-10-CM

## 2017-12-26 DIAGNOSIS — R627 Adult failure to thrive: Secondary | ICD-10-CM | POA: Diagnosis present

## 2017-12-26 DIAGNOSIS — Z923 Personal history of irradiation: Secondary | ICD-10-CM

## 2017-12-26 DIAGNOSIS — Z888 Allergy status to other drugs, medicaments and biological substances status: Secondary | ICD-10-CM

## 2017-12-26 DIAGNOSIS — L98429 Non-pressure chronic ulcer of back with unspecified severity: Secondary | ICD-10-CM | POA: Diagnosis present

## 2017-12-26 DIAGNOSIS — R7303 Prediabetes: Secondary | ICD-10-CM | POA: Diagnosis present

## 2017-12-26 DIAGNOSIS — R0603 Acute respiratory distress: Secondary | ICD-10-CM

## 2017-12-26 DIAGNOSIS — B9689 Other specified bacterial agents as the cause of diseases classified elsewhere: Secondary | ICD-10-CM | POA: Diagnosis present

## 2017-12-26 DIAGNOSIS — R402243 Coma scale, best verbal response, confused conversation, at hospital admission: Secondary | ICD-10-CM | POA: Diagnosis present

## 2017-12-26 DIAGNOSIS — R296 Repeated falls: Secondary | ICD-10-CM | POA: Diagnosis not present

## 2017-12-26 DIAGNOSIS — R402363 Coma scale, best motor response, obeys commands, at hospital admission: Secondary | ICD-10-CM | POA: Diagnosis present

## 2017-12-26 DIAGNOSIS — Z515 Encounter for palliative care: Secondary | ICD-10-CM | POA: Diagnosis present

## 2017-12-26 DIAGNOSIS — Z66 Do not resuscitate: Secondary | ICD-10-CM | POA: Diagnosis present

## 2017-12-26 DIAGNOSIS — Z881 Allergy status to other antibiotic agents status: Secondary | ICD-10-CM

## 2017-12-26 DIAGNOSIS — Z9981 Dependence on supplemental oxygen: Secondary | ICD-10-CM | POA: Diagnosis not present

## 2017-12-26 DIAGNOSIS — I493 Ventricular premature depolarization: Secondary | ICD-10-CM | POA: Diagnosis present

## 2017-12-26 DIAGNOSIS — F0390 Unspecified dementia without behavioral disturbance: Secondary | ICD-10-CM | POA: Diagnosis present

## 2017-12-26 DIAGNOSIS — L539 Erythematous condition, unspecified: Secondary | ICD-10-CM | POA: Diagnosis present

## 2017-12-26 DIAGNOSIS — J9811 Atelectasis: Secondary | ICD-10-CM | POA: Diagnosis present

## 2017-12-26 DIAGNOSIS — I5030 Unspecified diastolic (congestive) heart failure: Secondary | ICD-10-CM | POA: Diagnosis not present

## 2017-12-26 DIAGNOSIS — Z993 Dependence on wheelchair: Secondary | ICD-10-CM | POA: Diagnosis not present

## 2017-12-26 DIAGNOSIS — Z7189 Other specified counseling: Secondary | ICD-10-CM

## 2017-12-26 DIAGNOSIS — L8991 Pressure ulcer of unspecified site, stage 1: Secondary | ICD-10-CM | POA: Diagnosis present

## 2017-12-26 DIAGNOSIS — I110 Hypertensive heart disease with heart failure: Secondary | ICD-10-CM | POA: Diagnosis not present

## 2017-12-26 DIAGNOSIS — R7881 Bacteremia: Secondary | ICD-10-CM | POA: Diagnosis present

## 2017-12-26 DIAGNOSIS — G9340 Encephalopathy, unspecified: Secondary | ICD-10-CM | POA: Diagnosis present

## 2017-12-26 DIAGNOSIS — J181 Lobar pneumonia, unspecified organism: Secondary | ICD-10-CM

## 2017-12-26 DIAGNOSIS — J449 Chronic obstructive pulmonary disease, unspecified: Secondary | ICD-10-CM | POA: Diagnosis not present

## 2017-12-26 DIAGNOSIS — L89629 Pressure ulcer of left heel, unspecified stage: Secondary | ICD-10-CM | POA: Diagnosis not present

## 2017-12-26 DIAGNOSIS — R8271 Bacteriuria: Secondary | ICD-10-CM | POA: Diagnosis not present

## 2017-12-26 DIAGNOSIS — R918 Other nonspecific abnormal finding of lung field: Secondary | ICD-10-CM | POA: Diagnosis not present

## 2017-12-26 DIAGNOSIS — R410 Disorientation, unspecified: Secondary | ICD-10-CM | POA: Diagnosis not present

## 2017-12-26 DIAGNOSIS — N390 Urinary tract infection, site not specified: Secondary | ICD-10-CM | POA: Diagnosis not present

## 2017-12-26 DIAGNOSIS — Z880 Allergy status to penicillin: Secondary | ICD-10-CM

## 2017-12-26 DIAGNOSIS — J440 Chronic obstructive pulmonary disease with acute lower respiratory infection: Secondary | ICD-10-CM | POA: Diagnosis present

## 2017-12-26 DIAGNOSIS — Z91048 Other nonmedicinal substance allergy status: Secondary | ICD-10-CM

## 2017-12-26 DIAGNOSIS — Z887 Allergy status to serum and vaccine status: Secondary | ICD-10-CM

## 2017-12-26 LAB — COMPREHENSIVE METABOLIC PANEL
ALK PHOS: 99 U/L (ref 38–126)
ALT: 15 U/L (ref 0–44)
AST: 24 U/L (ref 15–41)
Albumin: 2.7 g/dL — ABNORMAL LOW (ref 3.5–5.0)
Anion gap: 11 (ref 5–15)
BUN: 32 mg/dL — ABNORMAL HIGH (ref 8–23)
CALCIUM: 9.7 mg/dL (ref 8.9–10.3)
CO2: 27 mmol/L (ref 22–32)
Chloride: 100 mmol/L (ref 98–111)
Creatinine, Ser: 0.73 mg/dL (ref 0.44–1.00)
GFR calc Af Amer: >60 mL/min (ref >60)
GFR calc non Af Amer: >60 mL/min (ref >60)
GLUCOSE: 101 mg/dL — AB (ref 70–99)
Potassium: 4.2 mmol/L (ref 3.5–5.1)
SODIUM: 138 mmol/L (ref 135–145)
Total Bilirubin: 0.7 mg/dL (ref 0.3–1.2)
Total Protein: 5.8 g/dL — ABNORMAL LOW (ref 6.5–8.1)

## 2017-12-26 LAB — ABO/RH: ABO/RH(D): A POS

## 2017-12-26 LAB — TYPE AND SCREEN
ABO/RH(D): A POS
ANTIBODY SCREEN: NEGATIVE

## 2017-12-26 LAB — CBC
HCT: 34.3 % — ABNORMAL LOW (ref 36.0–46.0)
Hemoglobin: 9.7 g/dL — ABNORMAL LOW (ref 12.0–15.0)
MCH: 28 pg (ref 26.0–34.0)
MCHC: 28.3 g/dL — AB (ref 30.0–36.0)
MCV: 98.8 fL (ref 78.0–100.0)
PLATELETS: 226 10*3/uL (ref 150–400)
RBC: 3.47 MIL/uL — ABNORMAL LOW (ref 3.87–5.11)
RDW: 18.8 % — ABNORMAL HIGH (ref 11.5–15.5)
WBC: 8.2 10*3/uL (ref 4.0–10.5)

## 2017-12-26 LAB — URINALYSIS, ROUTINE W REFLEX MICROSCOPIC
Bilirubin Urine: NEGATIVE
GLUCOSE, UA: NEGATIVE mg/dL
Hgb urine dipstick: NEGATIVE
Ketones, ur: 5 mg/dL — AB
Nitrite: POSITIVE — AB
Protein, ur: 30 mg/dL — AB
Specific Gravity, Urine: 1.026 (ref 1.005–1.030)
WBC, UA: >50 WBC/hpf — ABNORMAL HIGH (ref 0–5)
pH: 5 (ref 5.0–8.0)

## 2017-12-26 LAB — PROTIME-INR
INR: 2.65
Prothrombin Time: 28 seconds — ABNORMAL HIGH (ref 11.4–15.2)

## 2017-12-26 LAB — POC OCCULT BLOOD, ED: Fecal Occult Bld: POSITIVE — AB

## 2017-12-26 LAB — CBG MONITORING, ED: GLUCOSE-CAPILLARY: 81 mg/dL (ref 70–99)

## 2017-12-26 LAB — I-STAT CG4 LACTIC ACID, ED: Lactic Acid, Venous: 1.88 mmol/L (ref 0.5–1.9)

## 2017-12-26 LAB — BRAIN NATRIURETIC PEPTIDE: B Natriuretic Peptide: 940.8 pg/mL — ABNORMAL HIGH (ref 0.0–100.0)

## 2017-12-26 MED ORDER — ATENOLOL 25 MG PO TABS
25.0000 mg | ORAL_TABLET | Freq: Every day | ORAL | Status: DC
  Administered 2017-12-27 – 2017-12-30 (×4): 25 mg via ORAL
  Filled 2017-12-26 (×4): qty 1

## 2017-12-26 MED ORDER — VERAPAMIL HCL ER 240 MG PO TBCR
240.0000 mg | EXTENDED_RELEASE_TABLET | Freq: Every day | ORAL | Status: DC
  Administered 2017-12-27 – 2017-12-30 (×4): 240 mg via ORAL
  Filled 2017-12-26 (×4): qty 1

## 2017-12-26 MED ORDER — SODIUM CHLORIDE 0.9 % IV SOLN
3.0000 g | Freq: Four times a day (QID) | INTRAVENOUS | Status: DC
  Administered 2017-12-26 – 2017-12-27 (×2): 3 g via INTRAVENOUS
  Filled 2017-12-26 (×3): qty 3

## 2017-12-26 MED ORDER — LEVOFLOXACIN IN D5W 750 MG/150ML IV SOLN: 750.0000 mg | INTRAVENOUS | Status: DC

## 2017-12-26 MED ORDER — LEVOFLOXACIN IN D5W 750 MG/150ML IV SOLN
750.0000 mg | Freq: Once | INTRAVENOUS | Status: AC
  Administered 2017-12-26: 750 mg via INTRAVENOUS
  Filled 2017-12-26: qty 150

## 2017-12-26 MED ORDER — ACETAMINOPHEN 325 MG PO TABS
650.0000 mg | ORAL_TABLET | Freq: Four times a day (QID) | ORAL | Status: DC | PRN
  Administered 2017-12-27: 650 mg via ORAL
  Filled 2017-12-26: qty 2

## 2017-12-26 MED ORDER — IPRATROPIUM-ALBUTEROL 0.5-2.5 (3) MG/3ML IN SOLN
3.0000 mL | Freq: Four times a day (QID) | RESPIRATORY_TRACT | Status: DC
  Administered 2017-12-26 – 2017-12-27 (×5): 3 mL via RESPIRATORY_TRACT
  Filled 2017-12-26 (×5): qty 3

## 2017-12-26 MED ORDER — FERROUS SULFATE 325 (65 FE) MG PO TABS
325.0000 mg | ORAL_TABLET | Freq: Every day | ORAL | Status: DC
  Administered 2017-12-27 – 2017-12-30 (×4): 325 mg via ORAL
  Filled 2017-12-26 (×4): qty 1

## 2017-12-26 MED ORDER — APIXABAN 5 MG PO TABS
5.0000 mg | ORAL_TABLET | Freq: Two times a day (BID) | ORAL | Status: DC
  Administered 2017-12-26 – 2017-12-30 (×8): 5 mg via ORAL
  Filled 2017-12-26 (×8): qty 1

## 2017-12-26 MED ORDER — SERTRALINE HCL 25 MG PO TABS
25.0000 mg | ORAL_TABLET | Freq: Every day | ORAL | Status: DC
  Administered 2017-12-26 – 2017-12-30 (×5): 25 mg via ORAL
  Filled 2017-12-26 (×5): qty 1

## 2017-12-26 MED ORDER — SODIUM CHLORIDE 0.9 % IV SOLN
1000.0000 mL | INTRAVENOUS | Status: DC
  Administered 2017-12-26: 1000 mL via INTRAVENOUS

## 2017-12-26 MED ORDER — FUROSEMIDE 20 MG PO TABS
20.0000 mg | ORAL_TABLET | Freq: Every day | ORAL | Status: DC
  Administered 2017-12-27 – 2017-12-31 (×5): 20 mg via ORAL
  Filled 2017-12-26 (×5): qty 1

## 2017-12-26 MED ORDER — SODIUM CHLORIDE 0.9% FLUSH
3.0000 mL | Freq: Two times a day (BID) | INTRAVENOUS | Status: DC
  Administered 2017-12-26 – 2017-12-30 (×4): 3 mL via INTRAVENOUS

## 2017-12-26 MED ORDER — ACETAMINOPHEN 650 MG RE SUPP: 650.0000 mg | Freq: Four times a day (QID) | RECTAL | Status: DC | PRN

## 2017-12-26 MED ORDER — ROSUVASTATIN CALCIUM 10 MG PO TABS
10.0000 mg | ORAL_TABLET | ORAL | Status: DC
  Administered 2017-12-27 – 2017-12-29 (×2): 10 mg via ORAL
  Filled 2017-12-26 (×2): qty 1

## 2017-12-26 NOTE — ED Triage Notes (Signed)
Per ems pt with AMS today at [facility]. Pt was lethargic and confused. Had CXR this am, showed PNA.

## 2017-12-26 NOTE — Progress Notes (Addendum)
Pharmacy Antibiotic Note  Alexandra Luna is a 84 y.o. female admitted on 12/26/2017 with aspiration PNA.  Pharmacy has been consulted for ampicillin/sulbactam  Dosing. It is noted that the patient has a rash allergy to Penicillins, however there is also documentation that the patient has also tolerated amoxil. D/W MD, will proceed with unasyn orders.   Plan: Ampicillin/Sulbactam 3gm IV q6h Monitor for clinical course, LOT, and deescalation Monitor renal function     Temp (24hrs), Avg:97.1 F (36.2 C), Min:97.1 F (36.2 C), Max:97.1 F (36.2 C)  Recent Labs  Lab 12/26/17 1620 12/26/17 1636  WBC 8.2  --   CREATININE 0.73  --   LATICACIDVEN  --  1.88    CrCl cannot be calculated (Unknown ideal weight.).    Allergies  Allergen Reactions  . Influenza Vaccines Swelling and Other (See Comments)    Pt's arm swelled and dr told her not to take vaccine again  . T[place]a [Trandolapril-Verapamil Hcl Er] Other (See Comments) and Hypertension    Increased blood pressure  . Adhesive [Tape] Rash    Adhesive bandaids   . Azithromycin Rash  . Lipitor [Atorvastatin] Rash  . Penicillins Rash    Has taken amoxicillin Has patient had a PCN reaction causing immediate rash, facial/tongue/throat swelling, SOB or lightheadedness with hypotension: Yes Has patient had a PCN reaction causing severe rash involving mucus membranes or skin necrosis: Unk Has patient had a PCN reaction that required hospitalization: Unk Has patient had a PCN reaction occurring within the last 10 years: Unk If all of the above answers are "NO", then may proceed with Cephalosporin use.    Alexandra Luna A. Levada Dy, PharmD, [place] Pager: [phone] Please utilize Amion for appropriate phone number to reach the unit pharmacist ([place])   12/26/2017 9:16 PM

## 2017-12-26 NOTE — ED Provider Notes (Signed)
  Face-to-face evaluation   History: She presents for evaluation of pneumonia found on imaging at her facility.  She lives in an assisted living facility.  She was hospitalized last month with encephalopathy, at that time was treated with IV antibiotics, for bacteremia.  Palliative care treatment was recommended.  She denies cough, shortness of breath, fever.  She is unable to give complete history.  Physical exam: Obese elderly female.  She appears lethargic.  She is alert and responsive.  She is confused.  Lungs clear anteriorly.  No increased work of breathing.  Bilateral leg edema.  Healing left heel ulcer.  Medical screening examination/treatment/procedure(s) were conducted as a shared visit with non-physician practitioner(s) and myself.  I personally evaluated the patient during the encounter    Daleen Bo, MD 12/27/17 [phone]

## 2017-12-26 NOTE — H&P (Addendum)
Date: 12/26/2017               Patient Name:  Alexandra Luna MRN: [id number]  DOB: [birth year] Age / Sex: 84 y.o., female   PCP: Reymundo Poll, MD         Medical Service: Internal Medicine Teaching Service         Attending Physician: Dr. Bartholomew Crews, MD    First Contact: Dr. Truman Hayward Pager: [phone]  Second Contact: Dr. Trilby Drummer Pager: [phone]       After Hours (After 5p/  First Contact Pager: [phone]  weekends / holidays): Second Contact Pager: [phone]   Chief Complaint: Pneumonia   History of Present Illness:  Alexandra Luna is a 84yo female with PMH of afib on eliquis, breast cancer s/p lumpectomy, HTN, dementia, COPD, and diastolic heart failure presenting via EMS from [facility] for confusion, SOB and after chest xray showed right lower lobe opacity. On visit to patient's room she is oriented to self only and was unable to provide complete history. I attempted to contact family and [facility] but was unable to reach anyone.  Patient states she has been short of breath recently and non-productive cough. She states she uses supplemental O2 at SNF, but is unsure how much. On visit in ED her sats were around 98% on 3L. She is unsure if she has felt feverish. She denies nausea, abdominal pain, or dysuria. She appears to likely be bed bound and states she normally just uses the bathroom in bed and "her urine is taken away." She is not sure when she last walked around or stood up.  She states she has pain on her left heal where she has a healing ulcer. She otherwise denies any pain.   Contact: Daughter, Alexandra Luna [phone]  Meds:  Current Meds  Medication Sig  . acetaminophen (TYLENOL) 325 MG tablet Take 650 mg by mouth 2 (two) times daily as needed for mild pain.  . Amino Acids-Protein Hydrolys (FEEDING SUPPLEMENT, PRO-STAT SUGAR FREE 64,) LIQD Take 30 mLs by mouth daily.  Marland Kitchen apixaban (ELIQUIS) 5 MG TABS tablet Take 5 mg by mouth 2 (two) times daily.  Marland Kitchen atenolol (TENORMIN)  25 MG tablet Take 1 tablet (25 mg total) by mouth at bedtime.  Marland Kitchen erythromycin ophthalmic ointment Place 1 application into both eyes See admin instructions. Apply 1" of ointment to both eyes 4 times a day for five days for infection  . ferrous sulfate 325 (65 FE) MG tablet Take 325 mg by mouth daily with breakfast.  . fexofenadine (ALLEGRA) 180 MG tablet Take 180 mg by mouth daily.  . fluticasone (FLONASE) 50 MCG/ACT nasal spray Place 1 spray into both nostrils at bedtime.   . furosemide (LASIX) 20 MG tablet Take 1 tablet (20 mg total) by mouth daily.  Marland Kitchen guaiFENesin (MUCINEX) 600 MG 12 hr tablet Take 600 mg by mouth 2 (two) times daily.  Marland Kitchen ipratropium-albuterol (DUONEB) 0.5-2.5 (3) MG/3ML SOLN Take 3 mLs by nebulization every 6 (six) hours as needed (for shortness of breath or wheezing).  Marland Kitchen levofloxacin (LEVAQUIN) 750 MG tablet Take 750 mg by mouth daily.  . Multiple Vitamins-Iron (MULTIVITAMINS WITH IRON) TABS tablet Take 1 tablet by mouth daily.  [place] (OYSTER CALCIUM) 500 MG TABS tablet Take 500 mg of elemental calcium by mouth daily.  . psyllium (REGULOID) 0.52 g capsule Take 0.52 g by mouth daily.  . rosuvastatin (CRESTOR) 10 MG tablet Take 1 tablet (10 mg  total) by mouth every other day.  . senna (SENOKOT) 8.6 MG TABS tablet Take 1-2 tablets by mouth See admin instructions. Take 1 tablet by mouth once a day and an additional 1 tablet at bedtime as needed for constipation  . sertraline (ZOLOFT) 25 MG tablet Take 25 mg by mouth at bedtime.  . verapamil (VERELAN PM) 240 MG 24 hr capsule Take 1 capsule (240 mg total) by mouth at bedtime.     Allergies: Allergies as of 12/26/2017 - Review Complete 12/26/2017  Allergen Reaction Noted  . Influenza vaccines Swelling and Other (See Comments) 06/28/2014  . Tarka [trandolapril-verapamil hcl er] Other (See Comments) and Hypertension 09/21/2012  . Adhesive [tape] Rash 03/19/2011  . Azithromycin Rash 06/28/2014  . Lipitor [atorvastatin]  Rash 09/21/2012  . Penicillins Rash 03/19/2011   Past Medical History:  Diagnosis Date  . Anxiety   . Atrial fibrillation (HCC)    On chronic Eliquis  . Breast cancer ([place]) dx'd 1997   left; lt lumpectomy and XRT  . Mixed hyperlipidemia   . Palpitations   . Pre-diabetes   . Renal hypertension 03/07/2005   no evidence of significant diameter reduction, dissection, tortuosity, FMD or orther vascular abnormality; kidneys equal in size, symmetry, with normal cortex and medulla    Family History:  Family History  Problem Relation Age of Onset  . Heart failure Mother   . Breast cancer Mother   . Hypertension Father   . Kidney cancer Father   . Stroke Maternal Grandmother     Social History:  Patient lives in long term care facility at [facility].    Review of Systems: A complete ROS was negative except as per HPI.   Physical Exam: Blood pressure 115/71, pulse (!) 58, temperature (!) 97.5 F (36.4 C), temperature source Oral, resp. rate 17, SpO2 100 %.  Constitution: NAD, appears chronically ill, lying supine in bed HEENT: AT/[place], no scleral icterus, EOM intact Cardio: irregularly irregular, no m/r/g Respiratory: audible rales, crackles RLL, no wheezing Abdominal: NTTP, soft, non-distended MSK: trace edema, strength symmetrical GU: purewick in place Neuro: alert & oriented to self only, cooperative, pleasant but confused Skin: RLE with mild erythema and warmth, TTP. LLE heel has healing ulcer with eschar  CBC Latest Ref Rng & Units 12/26/2017 11/12/2017 11/11/2017  WBC 4.0 - 10.5 K/uL 8.2 10.6(H) 10.6(H)  Hemoglobin 12.0 - 15.0 g/dL 9.7(L) 10.4(L) 10.0(L)  Hematocrit 36.0 - 46.0 % 34.3(L) 33.3(L) 31.3(L)  Platelets 150 - 400 K/uL 226 186 190   BMP Latest Ref Rng & Units 12/26/2017 11/12/2017 11/11/2017  Glucose 70 - 99 mg/dL 101(H) 95 102(H)  BUN 8 - 23 mg/dL 32(H) 36(H) 42(H)  Creatinine 0.44 - 1.00 mg/dL 0.73 0.97 1.00  Sodium 135 - 145 mmol/L 138 135 133(L)  Potassium  3.5 - 5.1 mmol/L 4.2 4.5 3.9  Chloride 98 - 111 mmol/L 100 104 103  CO2 22 - 32 mmol/L 27 20(L) 20(L)  Calcium 8.9 - 10.3 mg/dL 9.7 8.8(L) 8.4(L)   Urinalysis    Component Value Date/Time   COLORURINE AMBER (A) 12/26/2017 1616   APPEARANCEUR CLOUDY (A) 12/26/2017 1616   LABSPEC 1.026 12/26/2017 1616   PHURINE 5.0 12/26/2017 1616   GLUCOSEU NEGATIVE 12/26/2017 1616   HGBUR NEGATIVE 12/26/2017 1616   BILIRUBINUR NEGATIVE 12/26/2017 1616   KETONESUR 5 (A) 12/26/2017 1616   PROTEINUR 30 (A) 12/26/2017 1616   UROBILINOGEN 0.2 06/27/2014 1305   NITRITE POSITIVE (A) 12/26/2017 1616   LEUKOCYTESUR MODERATE (A) 12/26/2017  1616   UA microscopic: positive bacteria   EKG: personally reviewed my interpretation is atrial fibrillation and PVCs.  CXR: personally reviewed my interpretation is right lower lobe effusion. There is some mild LLL effusion as well.  XR Ankle: Shallow soft tissue ulceration suggested along the plantar aspect of the hindfoot. No frank bone destruction or fracture. Mild soft tissue swelling about the ankle. Electronically Signed   By: Ashley Royalty M.D.   On: 12/26/2017 17:16  Assessment & Plan by Problem: Principal Problem:   Pneumonia  84yo female with PMH of afib on eliquis, breast cancer s/p lumpectomy, HTN, dementia, COPD, and diastolic heart failure presenting via EMS from [facility] for confusion and after chest xray showed right lower lobe opacity.  Chronic Hypoxic Respiratory Failure Chronic Diastolic Heart Failure Right Lower Lobe Pneumonia Patient admitted after CXR at her facility showed RLL opacification and presenting with audible rales and RLL crackles. This does appear to likely be a lower lobe pneumonia. She does have a history of diastolic heart failure and has a small LLL effusion as well. Acute on chronic HF is also a possible component of her symptoms although she does not appear volume up. COPD is mentioned in her history multiple times but  no PFTs found.   - Unasyn per pharm plus doxycycline  - repeat CXR AP/lateral in the am - am BMP - hold fluids - daily weights - duonebs q6h - strict I/O's - PT/OT eval  - admit to med surg - cont. Home lasix 20mg  qd  Dementia: She is currently confused but pleasant. We have been unable to contact her daughter or [facility] to confirm her baseline mental status or supplement O2 requirements. Her AMS could be baseline dementia or secondary to pneumonia or possible UTI as her UA was significant for nitrites, leuks, and bacteria. [place]: [phone]  - urine cx - contact family and [place] [facility] in the am   Lower Extremity Erythema: Her right foot has mild erythema and warmth and is TTP. This does not appear to be cellulitis but more vasodilatory in nature. She has a LLE heal ulcer with eschar.   - coverage with doxycycline  - consult to wound care for LLE ulcer   Atrial Fibrillation: on eliquis  - cont. Eliquis, atenolol, verapamil    Diet: heart healthy VTE: Eliquis IVF: None Code: DNR   Dispo: Admit patient to Inpatient with expected length of stay greater than 2 midnights.  SignedMarty Heck, DO 12/26/2017, 9:39 PM  Pager: [phone]

## 2017-12-26 NOTE — ED Notes (Signed)
Pt placed on 2lpm o2 to see if o2 sats would come up.

## 2017-12-26 NOTE — Progress Notes (Signed)
Pharmacy Antibiotic Note  Alexandra Luna is a 84 y.o. female admitted on 12/26/2017 with pneumonia.  Pharmacy has been consulted for levofloxacin dosing. WBC 8.2, sCr 0.73,   Plan: Levofloxacin 750mg  IV Q24H F/u LOT/de-escalation, renal function, cultures.    No data recorded.  No results for input(s): WBC, CREATININE, LATICACIDVEN, VANCOTROUGH, VANCOPEAK, VANCORANDOM, GENTTROUGH, GENTPEAK, GENTRANDOM, TOBRATROUGH, TOBRAPEAK, TOBRARND, AMIKACINPEAK, AMIKACINTROU, AMIKACIN in the last 168 hours.  CrCl cannot be calculated (Patient's most recent lab result is older than the maximum 21 days allowed.).    Allergies  Allergen Reactions  . Influenza Vaccines Swelling and Other (See Comments)    Pt's arm swelled and dr told her not to take vaccine again  . Tarka [Trandolapril-Verapamil Hcl Er] Other (See Comments) and Hypertension    Increased blood pressure  . Adhesive [Tape] Rash    Adhesive bandaids   . Azithromycin Rash  . Lipitor [Atorvastatin] Rash  . Penicillins Rash    Has taken amoxicillin Has patient had a PCN reaction causing immediate rash, facial/tongue/throat swelling, SOB or lightheadedness with hypotension: Yes Has patient had a PCN reaction causing severe rash involving mucus membranes or skin necrosis: Unk Has patient had a PCN reaction that required hospitalization: Unk Has patient had a PCN reaction occurring within the last 10 years: Unk If all of the above answers are "NO", then may proceed with Cephalosporin use.     Antimicrobials this admission: Levofloxacin 9/26  >>    Dose adjustments this admission:   Microbiology results:   Thank you for allowing pharmacy to be a part of this patient's care.  Harrietta Guardian, PharmD PGY1 Pharmacy Resident Direct Phone [phone] 12/26/2017    7:47 PM

## 2017-12-26 NOTE — ED Notes (Signed)
Purewick placed on pt. 

## 2017-12-26 NOTE — ED Provider Notes (Signed)
[facility] EMERGENCY DEPARTMENT Provider Note   CSN: [id number] Arrival date & time: 12/26/17  1552     History   Chief Complaint Chief Complaint  Patient presents with  . Altered Mental Status    HPI Alexandra Luna is a 84 y.o. female.  Patient with history of atrial fibrillation on Eliquis, breast cancer, hypertension, dementia, heart failure, rectal bleeding --presents from her nursing facility with lethargy and altered mental status.  Patient presents with an x-ray results demonstrating pneumonia.  Per EMS, she was hypoxic into 70's off O2 on arrival.  Patient also has dressing on left chronic heel wound.  Level 5 caveat due to altered mental status and dementia.  Patient has a MOST form with her indicating DNR status.  Also indicates desire for antibiotics and other medical therapies as needed, no feeding tube.      Past Medical History:  Diagnosis Date  . Anxiety   . Atrial fibrillation (HCC)    On chronic Eliquis  . Breast cancer ([place]) dx'd 1997   left; lt lumpectomy and XRT  . Mixed hyperlipidemia   . Palpitations   . Pre-diabetes   . Renal hypertension 03/07/2005   no evidence of significant diameter reduction, dissection, tortuosity, FMD or orther vascular abnormality; kidneys equal in size, symmetry, with normal cortex and medulla    Patient Active Problem List   Diagnosis Date Noted  . Dementia without behavioral disturbance   . Enterococcal bacteremia 11/10/2017  . Palliative care encounter   . Blood in stool 11/08/2017  . Elevated temperature 11/08/2017  . Leukocytosis 11/08/2017  . Acute metabolic encephalopathy 26/71/2458  . Metabolic encephalopathy 09/98/3382  . CHF exacerbation ([place]) 11/01/2016  . Chronic diastolic CHF (congestive heart failure) ([place]) 09/06/2016  . ARF (acute renal failure) ([place]) 09/06/2016  . Weakness generalized 09/06/2016  . COPD exacerbation ([place]) 04/01/2015  . Bilateral pleural effusion 08/30/2014  .  Multiple pulmonary nodules 08/30/2014  . Acute diastolic congestive heart failure ([place]) 08/14/2014  . Acute respiratory failure with hypoxia ([place]) 08/14/2014  . Generalized weakness 06/28/2014  . Chronic anticoagulation 12/08/2012  . AF (atrial fibrillation) ([place]) 09/22/2012  . Essential hypertension 09/22/2012  . Hyperlipidemia, mixed 09/22/2012  . History of breast cancer in female 04/28/2012    Past Surgical History:  Procedure Laterality Date  . BREAST LUMPECTOMY    . BREAST LUMPECTOMY  1998   left side  . CARDIOVASCULAR STRESS TEST  526/2011   R/Dipyridamole - EF 71%; normal myocaridal perfusion w/o evidence of ischemia or infarct; no schintigraphic evidence of inducible myocaridal ischemia; global LV function normal; EKG negative for ischemia; low risk scan   . CARDIOVERSION N/A 11/12/2012   Procedure: CARDIOVERSION;  Surgeon: Troy Sine, MD;  Location: [facility];  Service: Cardiovascular;  Laterality: N/A;  . CESAREAN SECTION     x 3  . DOPPLER ECHOCARDIOGRAPHY  08/29/2011   EF >50%;  LV systolic fcn normal; doppler flow pattern suggestive of impaired LV relaxation; LA mildly dilated; aortic valve mildly sclerotic, no aortic stenosis  . tonsillectomy       OB History   None      Home Medications    Prior to Admission medications   Medication Sig Start Date End Date Taking? Authorizing Provider  acetaminophen (TYLENOL) 325 MG tablet Take 650 mg by mouth 2 (two) times daily as needed for mild pain.    [provider]  albuterol (PROVENTIL HFA;VENTOLIN HFA) 108 (90 Base) MCG/ACT inhaler Inhale 1  puff into the lungs every 4 (four) hours as needed for wheezing or shortness of breath. 04/03/15   Nita Sells, MD  apixaban (ELIQUIS) 2.5 MG TABS tablet Take 1 tablet (2.5 mg total) by mouth 2 (two) times daily. 12/23/13   Troy Sine, MD  atenolol (TENORMIN) 25 MG tablet Take 1 tablet (25 mg total) by mouth at bedtime. 07/01/14   Robbie Lis, MD    calcium carbonate (OS-CAL) 600 MG TABS Take 600 mg by mouth daily.     [provider]  fexofenadine (ALLEGRA) 180 MG tablet Take 180 mg by mouth daily.    [provider]  fluticasone (FLONASE) 50 MCG/ACT nasal spray Place 1 spray into both nostrils at bedtime.  07/04/12   [provider]  furosemide (LASIX) 20 MG tablet Take 1 tablet (20 mg total) by mouth daily. 08/15/14   Robbie Lis, MD  HYDROcodone-acetaminophen (NORCO/VICODIN) 5-325 MG tablet Take 0.5 tablets by mouth every 6 (six) hours as needed for moderate pain. 11/15/17   Ghimire, Henreitta Leber, MD  Multiple Vitamins-Iron (MULTIVITAMINS WITH IRON) TABS tablet Take 1 tablet by mouth daily.    [provider]  ondansetron (ZOFRAN) 4 MG tablet Take 1 tablet (4 mg total) by mouth every 6 (six) hours as needed for nausea. 08/15/14   Robbie Lis, MD  polyethylene glycol [facility] / Floria Raveling) packet Take 17 g by mouth daily as needed for mild constipation. 11/15/17   Ghimire, Henreitta Leber, MD  Psyllium 500 MG CAPS Take 500 mg by mouth daily.    [provider]  risperiDONE (RISPERDAL M-TABS) 0.5 MG disintegrating tablet Take 1 tablet (0.5 mg total) by mouth at bedtime as needed ([place]). 11/15/17   Ghimire, Henreitta Leber, MD  rosuvastatin (CRESTOR) 10 MG tablet Take 1 tablet (10 mg total) by mouth every other day. Patient not taking: Reported on 11/08/2017 05/10/14   Troy Sine, MD  senna (SENOKOT) 8.6 MG TABS tablet Take 1 tablet by mouth 3 (three) times a week. Monday, Wednesday and Friday    [provider]  sertraline (ZOLOFT) 25 MG tablet Take 25 mg by mouth at bedtime. 10/14/17   [provider]  Spacer/Aero-Holding Chambers (AEROCHAMBER PLUS FLO-VU MEDIUM) MISC 1 each by Other route once. 04/03/15   Nita Sells, MD  verapamil (VERELAN PM) 240 MG 24 hr capsule Take 1 capsule (240 mg total) by mouth at bedtime. 05/03/14   Troy Sine, MD    Family History Family History   Problem Relation Age of Onset  . Heart failure Mother   . Breast cancer Mother   . Hypertension Father   . Kidney cancer Father   . Stroke Maternal Grandmother     Social History Social History   Tobacco Use  . Smoking status: Former Smoker    Packs/day: 0.75    Years: 10.00    Pack years: 7.50    Types: Cigarettes    Last attempt to quit: 04/02/1988    Years since quitting: 29.7  . Smokeless tobacco: Never Used  Substance Use Topics  . Alcohol use: Yes    Alcohol/week: 0.0 standard drinks    Comment: 1/4 glass of wine every 2 weeks  . Drug use: No     Allergies   Influenza vaccines; Tarka [trandolapril-verapamil hcl er]; Adhesive [tape]; Azithromycin; Lipitor [atorvastatin]; and Penicillins   Review of Systems Review of Systems  Unable to perform ROS: Dementia     Physical Exam Updated Vital Signs  BP 99/66   Pulse 66   Resp 18   SpO2 100%   Physical Exam  Constitutional: She appears well-developed and well-nourished.  HENT:  Head: Normocephalic and atraumatic.  Eyes: Conjunctivae are normal. Right eye exhibits no discharge. Left eye exhibits no discharge.  Neck: Normal range of motion. Neck supple.  Cardiovascular: Normal rate, regular rhythm and normal heart sounds.  Pulmonary/Chest: Effort normal. She has rhonchi in the right middle field, the right lower field, the left middle field and the left lower field.  Abdominal: Soft. There is no tenderness. There is no rebound and no guarding.  Genitourinary:  Genitourinary Comments: Mild erythema to the perineal area and bilateral buttocks.  Likely related to mild yeast infection.  Does not appear cellulitic.  Musculoskeletal: She exhibits no edema or tenderness.  Neurological: She is alert.  Patient not oriented.   Skin: Skin is warm and dry.  There is a 2 cm necrotic eschar over the left heel with mild surrounding erythema.  No active drainage.  No associated cellulitis.  Psychiatric: She has a normal mood  and affect.  Patient conversant although appears sleepy.  She does not know why she is here and does not have any current complaints.  Nursing note and vitals reviewed.    ED Treatments / Results  Labs (all labs ordered are listed, but only abnormal results are displayed) Labs Reviewed  COMPREHENSIVE METABOLIC PANEL - Abnormal; Notable for the following components:      Result Value   Glucose, Bld 101 (*)    BUN 32 (*)    Total Protein 5.8 (*)    Albumin 2.7 (*)    All other components within normal limits  CBC - Abnormal; Notable for the following components:   RBC 3.47 (*)    Hemoglobin 9.7 (*)    HCT 34.3 (*)    MCHC 28.3 (*)    RDW 18.8 (*)    All other components within normal limits  URINALYSIS, ROUTINE W REFLEX MICROSCOPIC - Abnormal; Notable for the following components:   Color, Urine AMBER (*)    APPearance CLOUDY (*)    Ketones, ur 5 (*)    Protein, ur 30 (*)    Nitrite POSITIVE (*)    Leukocytes, UA MODERATE (*)    WBC, UA >50 (*)    Bacteria, UA MANY (*)    Non Squamous Epithelial 0-5 (*)    All other components within normal limits  PROTIME-INR - Abnormal; Notable for the following components:   Prothrombin Time 28.0 (*)    All other components within normal limits  POC OCCULT BLOOD, ED - Abnormal; Notable for the following components:   Fecal Occult Bld POSITIVE (*)    All other components within normal limits  CULTURE, BLOOD (ROUTINE X 2)  CULTURE, BLOOD (ROUTINE X 2)  URINE CULTURE  CBG MONITORING, ED  I-STAT CG4 LACTIC ACID, ED  I-STAT CG4 LACTIC ACID, ED  TYPE AND SCREEN  ABO/RH    EKG EKG Interpretation  Date/Time:  Thursday December 26 2017 16:08:46 EDT Ventricular Rate:  67 PR Interval:    QRS Duration: 99 QT Interval:  446 QTC Calculation: 471 R Axis:   120 Text Interpretation:  Atrial fibrillation Ventricular bigeminy Anterior infarct, old since last tracing no significant change Confirmed by Daleen Bo [phone] on 12/26/2017  4:13:38 PM   Radiology Dg Chest Port 1 View  Result Date: 12/26/2017 CLINICAL DATA:  Shortness of breath with hypoxia, lethargy and altered mental status.  EXAM: PORTABLE CHEST 1 VIEW COMPARISON:  11/15/2017 FINDINGS: Lungs are adequately inflated demonstrate hazy opacification over the right mid to lower lung likely layering effusion with associated basilar atelectasis. Infection in the right base is possible. Stable moderate cardiomegaly. Remainder of the exam is unchanged. IMPRESSION: Hazy opacification over the right mid to lower lung likely layering effusion with basilar atelectasis. Infection in the right base is possible. Stable moderate cardiomegaly. Electronically Signed   By: Marin Olp M.D.   On: 12/26/2017 17:11   Dg Ankle Left Port  Result Date: 12/26/2017 CLINICAL DATA:  Skin ulceration with altered mental status and lethargy. Open sore of the calcaneus. EXAM: PORTABLE LEFT ANKLE - 2 VIEW COMPARISON:  None. FINDINGS: There is mild soft tissue swelling about the malleoli. Calcaneal enthesopathy is noted along the plantar and dorsal aspect. Osteoarthritis across the talonavicular and midfoot articulations. No frank bone destruction or fracture. Shallow soft tissue ulceration is suggested along the plantar aspect of the hindfoot. No soft tissue emphysema. IMPRESSION: Shallow soft tissue ulceration suggested along the plantar aspect of the hindfoot. No frank bone destruction or fracture. Mild soft tissue swelling about the ankle. Electronically Signed   By: Ashley Royalty M.D.   On: 12/26/2017 17:16    Procedures Procedures (including critical care time)  Medications Ordered in ED Medications  0.9 %  sodium chloride infusion (1,000 mLs Intravenous New Bag/Given 12/26/17 1751)  levofloxacin (LEVAQUIN) IVPB 750 mg (750 mg Intravenous New Bag/Given 12/26/17 1751)     Initial Impression / Assessment and Plan / ED Course  I have reviewed the triage vital signs and the nursing  notes.  Pertinent labs & imaging results that were available during my care of the patient were reviewed by me and considered in my medical decision making (see chart for details).     Patient seen and examined. Work-up initiated.  Antibiotics ordered for presumed pneumonia.  Awaiting rectal temp.   Vital signs reviewed and are as follows: BP 99/66   Pulse 66   Resp 18   SpO2 100%   4:35 PM EKG reviewed. Pt discussed with and seen by Dr. Eulis Foster.   BP 107/63   Pulse 69   Resp (!) 28   SpO2 100%   5:39 PM Question on pneumonia on CXR, definite UTI on UA.   6:42 PM Pt rechecked. Iv in place. She is receiving abx. O2 is 91% on room air. Will call for admission.   7:03 PM Spoke with IMTS who will see for admission.    Final Clinical Impressions(s) / ED Diagnoses   Final diagnoses:  Skin ulcer ([place])  Acute cystitis without hematuria  Community acquired pneumonia of right lower lobe of lung ([place])  Rectal bleeding   Admit for infection/ALOC/rectal bleeding.   ED Discharge Orders    None       Carlisle Cater, Hershal Coria 12/26/17 1905    Daleen Bo, MD 12/27/17 [phone]

## 2017-12-26 NOTE — ED Notes (Signed)
Pt daugter is in [place] and would like to be called.  Vernie Murders [phone]

## 2017-12-27 ENCOUNTER — Inpatient Hospital Stay (HOSPITAL_COMMUNITY): Payer: Medicare Other

## 2017-12-27 DIAGNOSIS — J9611 Chronic respiratory failure with hypoxia: Secondary | ICD-10-CM

## 2017-12-27 DIAGNOSIS — L89629 Pressure ulcer of left heel, unspecified stage: Secondary | ICD-10-CM

## 2017-12-27 DIAGNOSIS — F0390 Unspecified dementia without behavioral disturbance: Secondary | ICD-10-CM

## 2017-12-27 DIAGNOSIS — Z881 Allergy status to other antibiotic agents status: Secondary | ICD-10-CM

## 2017-12-27 DIAGNOSIS — Z8701 Personal history of pneumonia (recurrent): Secondary | ICD-10-CM

## 2017-12-27 DIAGNOSIS — L8990 Pressure ulcer of unspecified site, unspecified stage: Secondary | ICD-10-CM

## 2017-12-27 DIAGNOSIS — J449 Chronic obstructive pulmonary disease, unspecified: Secondary | ICD-10-CM

## 2017-12-27 DIAGNOSIS — R296 Repeated falls: Secondary | ICD-10-CM

## 2017-12-27 DIAGNOSIS — Z8619 Personal history of other infectious and parasitic diseases: Secondary | ICD-10-CM

## 2017-12-27 DIAGNOSIS — Z8249 Family history of ischemic heart disease and other diseases of the circulatory system: Secondary | ICD-10-CM

## 2017-12-27 DIAGNOSIS — B9689 Other specified bacterial agents as the cause of diseases classified elsewhere: Secondary | ICD-10-CM

## 2017-12-27 DIAGNOSIS — Z79899 Other long term (current) drug therapy: Secondary | ICD-10-CM

## 2017-12-27 DIAGNOSIS — Z888 Allergy status to other drugs, medicaments and biological substances status: Secondary | ICD-10-CM

## 2017-12-27 DIAGNOSIS — Z887 Allergy status to serum and vaccine status: Secondary | ICD-10-CM

## 2017-12-27 DIAGNOSIS — R918 Other nonspecific abnormal finding of lung field: Secondary | ICD-10-CM

## 2017-12-27 DIAGNOSIS — R7881 Bacteremia: Secondary | ICD-10-CM

## 2017-12-27 DIAGNOSIS — Z853 Personal history of malignant neoplasm of breast: Secondary | ICD-10-CM

## 2017-12-27 DIAGNOSIS — Z880 Allergy status to penicillin: Secondary | ICD-10-CM

## 2017-12-27 DIAGNOSIS — G9340 Encephalopathy, unspecified: Secondary | ICD-10-CM

## 2017-12-27 DIAGNOSIS — Z9981 Dependence on supplemental oxygen: Secondary | ICD-10-CM

## 2017-12-27 DIAGNOSIS — I5030 Unspecified diastolic (congestive) heart failure: Secondary | ICD-10-CM

## 2017-12-27 DIAGNOSIS — I4891 Unspecified atrial fibrillation: Secondary | ICD-10-CM

## 2017-12-27 DIAGNOSIS — Z7901 Long term (current) use of anticoagulants: Secondary | ICD-10-CM

## 2017-12-27 DIAGNOSIS — I110 Hypertensive heart disease with heart failure: Secondary | ICD-10-CM

## 2017-12-27 DIAGNOSIS — Z66 Do not resuscitate: Secondary | ICD-10-CM

## 2017-12-27 DIAGNOSIS — Z9181 History of falling: Secondary | ICD-10-CM

## 2017-12-27 DIAGNOSIS — Z91048 Other nonmedicinal substance allergy status: Secondary | ICD-10-CM

## 2017-12-27 LAB — BASIC METABOLIC PANEL
Anion gap: 10 (ref 5–15)
BUN: 28 mg/dL — AB (ref 8–23)
CALCIUM: 9.5 mg/dL (ref 8.9–10.3)
CO2: 27 mmol/L (ref 22–32)
CREATININE: 0.63 mg/dL (ref 0.44–1.00)
Chloride: 101 mmol/L (ref 98–111)
GFR calc Af Amer: >60 mL/min (ref >60)
GFR calc non Af Amer: >60 mL/min (ref >60)
GLUCOSE: 74 mg/dL (ref 70–99)
Potassium: 3.8 mmol/L (ref 3.5–5.1)
Sodium: 138 mmol/L (ref 135–145)

## 2017-12-27 LAB — BLOOD CULTURE ID PANEL (REFLEXED)
ACINETOBACTER BAUMANNII: NOT DETECTED
CANDIDA PARAPSILOSIS: NOT DETECTED
CARBAPENEM RESISTANCE: NOT DETECTED
Candida albicans: NOT DETECTED
Candida glabrata: NOT DETECTED
Candida krusei: NOT DETECTED
Candida tropicalis: NOT DETECTED
Enterobacter cloacae complex: DETECTED — AB
Enterobacteriaceae species: DETECTED — AB
Enterococcus species: NOT DETECTED
Escherichia coli: NOT DETECTED
HAEMOPHILUS INFLUENZAE: NOT DETECTED
Klebsiella oxytoca: NOT DETECTED
Klebsiella pneumoniae: NOT DETECTED
Listeria monocytogenes: NOT DETECTED
NEISSERIA MENINGITIDIS: NOT DETECTED
PROTEUS SPECIES: NOT DETECTED
Pseudomonas aeruginosa: NOT DETECTED
SERRATIA MARCESCENS: NOT DETECTED
STAPHYLOCOCCUS AUREUS BCID: NOT DETECTED
STREPTOCOCCUS AGALACTIAE: NOT DETECTED
STREPTOCOCCUS PNEUMONIAE: NOT DETECTED
STREPTOCOCCUS PYOGENES: NOT DETECTED
STREPTOCOCCUS SPECIES: NOT DETECTED
Staphylococcus species: NOT DETECTED

## 2017-12-27 LAB — VITAMIN B12: Vitamin B-12: 810 pg/mL (ref 180–914)

## 2017-12-27 LAB — MRSA PCR SCREENING: MRSA BY PCR: POSITIVE — AB

## 2017-12-27 MED ORDER — SODIUM CHLORIDE 0.9 % IV SOLN
1.0000 g | Freq: Three times a day (TID) | INTRAVENOUS | Status: DC
  Filled 2017-12-27 (×2): qty 1

## 2017-12-27 MED ORDER — ACETAMINOPHEN 325 MG PO TABS
650.0000 mg | ORAL_TABLET | ORAL | Status: DC | PRN
  Administered 2017-12-28: 650 mg via ORAL
  Filled 2017-12-27: qty 2

## 2017-12-27 MED ORDER — SODIUM CHLORIDE 0.9 % IV SOLN
1.0000 g | Freq: Two times a day (BID) | INTRAVENOUS | Status: DC
  Administered 2017-12-27 (×2): 1 g via INTRAVENOUS
  Filled 2017-12-27 (×3): qty 1

## 2017-12-27 MED ORDER — PIPERACILLIN-TAZOBACTAM 3.375 G IVPB
3.3750 g | Freq: Three times a day (TID) | INTRAVENOUS | Status: DC
  Administered 2017-12-27 (×2): 3.375 g via INTRAVENOUS
  Filled 2017-12-27: qty 50

## 2017-12-27 MED ORDER — DOXYCYCLINE HYCLATE 100 MG PO TABS
100.0000 mg | ORAL_TABLET | Freq: Two times a day (BID) | ORAL | Status: DC
  Administered 2017-12-27: 100 mg via ORAL
  Filled 2017-12-27: qty 1

## 2017-12-27 MED ORDER — ACETAMINOPHEN 650 MG RE SUPP: 650.0000 mg | RECTAL | Status: DC | PRN

## 2017-12-27 MED ORDER — OXYCODONE HCL 5 MG PO TABS
5.0000 mg | ORAL_TABLET | Freq: Once | ORAL | Status: DC
  Filled 2017-12-27: qty 1

## 2017-12-27 MED ORDER — POLYETHYLENE GLYCOL 3350 17 G PO PACK: 17.0000 g | PACK | Freq: Every day | ORAL | Status: DC | PRN

## 2017-12-27 MED ORDER — SODIUM CHLORIDE 0.9 % IV SOLN
2.0000 g | Freq: Two times a day (BID) | INTRAVENOUS | Status: DC
  Filled 2017-12-27 (×2): qty 2

## 2017-12-27 MED ORDER — IPRATROPIUM-ALBUTEROL 0.5-2.5 (3) MG/3ML IN SOLN
3.0000 mL | Freq: Three times a day (TID) | RESPIRATORY_TRACT | Status: DC
  Administered 2017-12-28 – 2017-12-29 (×3): 3 mL via RESPIRATORY_TRACT
  Filled 2017-12-27: qty 3

## 2017-12-27 NOTE — Progress Notes (Signed)
Occupational Therapy Treatment Patient Details Name: Alexandra Luna MRN: [id number] DOB: [birth year] Today's Date: 12/27/2017          Follow Up Recommendations  SNF;Supervision/Assistance - 24 hour    Equipment Recommendations  None recommended by OT   Golden Circle, OTR/L Acute Rehab Services Pager [phone] Office [phone]

## 2017-12-27 NOTE — Evaluation (Deleted)
Occupational Therapy Evaluation Patient Details Name: TRENTON VERNE MRN: [id number] DOB: [birth year] Today's Date: 12/27/2017    History of Present Illness Ms. Tri is a 84yo female with PMH of afib on eliquis, breast cancer s/p lumpectomy, HTN, dementia, COPD, and diastolic heart failure presenting via EMS from [facility] (was there for antibiotics-previously at [facility]) for confusion, SOB and after chest xray showed right lower lobe opacity--PNA. From prior chart: Xray 11/12/17 shows acute fractures of left superior and inferor pubic rami, the superior fracture extending to the pubic symphysis. No significant displacement.   Clinical Impression   This 84 yo female admitted with above presents to acute OT with working on basic ADLs at bed level and feel she can A with her eating and grooming if positioned adequately to do so. For all other tasks pt is dependent. No further acute OT needs--defer to OT at SNF as they deem appropriate based off of PLOF from there. Acute OT will sign off.      Follow Up Recommendations  SNF;Supervision/Assistance - 24 hour          Precautions / Restrictions Precautions Precautions: Fall Restrictions Weight Bearing Restrictions: No Other Position/Activity Restrictions: Per. Dr. Sloan Leiter, patient is WBAT. (from 11/14/17 PT note)      Mobility Bed Mobility Overal bed mobility: Needs Assistance Bed Mobility: Supine to Sit;Sit to Supine     Supine to sit: Max assist;+2 for physical assistance Sit to supine: Max assist;+2 for physical assistance         Balance Overall balance assessment: Needs assistance Sitting-balance support: Single extremity supported;Feet supported Sitting balance-Leahy Scale: Zero Sitting balance - Comments: Pt wanting to lay back down once sitting up at EOB so not wanting to hold herself up                                    ADL either performed or assessed with clinical judgement   ADL                                         General ADL Comments: Feel she could feed herself with proper setup/S as well as be able to wash her face and hands post set up (if she is inclined to do so)     Vision Patient Visual Report: No change from baseline              Pertinent Vitals/Pain Pain Assessment: Faces Faces Pain Scale: Hurts little more Pain Location: back Pain Descriptors / Indicators: Discomfort;Sore Pain Intervention(s): Monitored during session;Limited activity within patient's tolerance;Repositioned(asked RN about pain meds--but not time yet for next dose)     Hand Dominance Right   Extremity/Trunk Assessment Upper Extremity Assessment Upper Extremity Assessment: Overall WFL for tasks assessed(She was able to raise both arms up overhead to grab head board to A with pulling herself up as well as using both ands to grab rail on right to initiate coming upon right side of bed)           Communication Communication Communication: HOH   Cognition Arousal/Alertness: Awake/alert Behavior During Therapy: Flat affect Overall Cognitive Status: History of cognitive impairments - at baseline Area of Impairment: Attention;Following commands;Safety/judgement;Problem solving                   Current  Attention Level: Sustained   Following Commands: (when she wants to) Safety/Judgement: Decreased awareness of safety   Problem Solving: Difficulty sequencing;Requires verbal cues                Home Living Family/patient expects to be discharged to:: [place]: Other (comment)(home O2 at 3 liters)   Additional Comments: was at [facility], but transferred to [facility] for IV antibiotics      Prior Functioning/Environment Level of Independence: Needs assistance  Gait / Transfers Assistance Needed: Per pt she can transfer to W/C by herself, but needs A to push it around  (per PT notes from one month ago pt reports she needs A to transfer to [place]) ADL's / Homemaking Assistance Needed: Needs A            OT Problem List: Decreased strength;Decreased activity tolerance;Impaired balance (sitting and/or standing);Pain         OT Goals(Current goals can be found in the care plan section) Acute Rehab OT Goals Patient Stated Goal: to lay back down  OT Frequency:                AM-PAC PT "6 Clicks" Daily Activity     Outcome Measure Help from another person eating meals?: A Little Help from another person taking care of personal grooming?: A Little Help from another person toileting, which includes using toliet, bedpan, or urinal?: Total Help from another person bathing (including washing, rinsing, drying)?: Total Help from another person to put on and taking off regular upper body clothing?: Total Help from another person to put on and taking off regular lower body clothing?: Total 6 Click Score: 10   End of Session Equipment Utilized During Treatment: Oxygen(3 liters) Nurse Communication: Mobility status;Need for lift equipment(called RN station to let them know pt needed purewick put back in)  Activity Tolerance: Other (comment)(self limiting--wanting to lay back down) Patient left: in bed;with call bell/phone within reach;with bed alarm set  OT Visit Diagnosis: Other abnormalities of gait and mobility (R26.89);Pain Pain - part of body: (back and left heel (Prevalon boot))                Time: 9166-0600 OT Time Calculation (min): 20 min Charges:  OT General Charges $OT Visit: 1 Visit OT Evaluation $OT Eval Moderate Complexity: 1 Mod  Golden Circle, OTR/L Acute [facility] [phone] Office [phone]

## 2017-12-27 NOTE — Progress Notes (Signed)
Pharmacy Antibiotic Note  Alexandra Luna is a 84 y.o. female admitted on 12/26/2017 with pneumonia and bacteremia.  Pharmacy has been consulted for Zosyn dosing. WBC WNL. Renal function good.   Noted PCN allergy on profile: pt has tolerated amoxicillin and tolerated Unasyn yesterday   Plan: Zosyn 3.375G IV q8h to be infused over 4 hours Doxycycline per MD Trend WBC, temp, renal function  F/U infectious work-up   Height: 5\' 6"  (167.6 cm) Weight: 188 lb 0.8 oz (85.3 kg) IBW/kg (Calculated) : 59.3  Temp (24hrs), Avg:97.4 F (36.3 C), Min:97.1 F (36.2 C), Max:97.7 F (36.5 C)  Recent Labs  Lab 12/26/17 1620 12/26/17 1636 12/27/17 0535  WBC 8.2  --   --   CREATININE 0.73  --  0.63  LATICACIDVEN  --  1.88  --     Estimated Creatinine Clearance: 53.5 mL/min (by C-G formula based on SCr of 0.63 mg/dL).    Allergies  Allergen Reactions  . Influenza Vaccines Swelling and Other (See Comments)    Pt's arm swelled and dr told her not to take vaccine again  . Tarka [Trandolapril-Verapamil Hcl Er] Other (See Comments) and Hypertension    Increased blood pressure  . Adhesive [Tape] Rash    Adhesive bandaids   . Azithromycin Rash  . Lipitor [Atorvastatin] Rash  . Penicillins Rash    Has taken amoxicillin Has patient had a PCN reaction causing immediate rash, facial/tongue/throat swelling, SOB or lightheadedness with hypotension: Yes Has patient had a PCN reaction causing severe rash involving mucus membranes or skin necrosis: Unk Has patient had a PCN reaction that required hospitalization: Unk Has patient had a PCN reaction occurring within the last 10 years: Unk If all of the above answers are "NO", then may proceed with Cephalosporin use.     Narda Bonds 12/27/2017 7:40 AM

## 2017-12-27 NOTE — Discharge Instructions (Addendum)

## 2017-12-27 NOTE — Progress Notes (Signed)
Occupational Therapy Evaluation Patient Details Name: Alexandra Luna MRN: [id number] DOB: 06-15-1926 Today's Date: 12/27/2017    History of Present Illness Alexandra Luna is a 84yo female with PMH of afib on eliquis, breast cancer s/p lumpectomy, HTN, dementia, COPD, and diastolic heart failure presenting via EMS from [facility] (was there for antibiotics-previously at [facility]) for confusion, SOB and after chest xray showed right lower lobe opacity--PNA. From prior chart: Xray 11/12/17 shows acute fractures of left superior and inferor pubic rami, the superior fracture extending to the pubic symphysis. No significant displacement.   Clinical Impression   This 84 yo female admitted with above presents to acute OT with working on basic ADLs at bed level and feel she can A with her eating and grooming if positioned adequately to do so. For all other tasks pt is dependent. No further acute OT needs--defer to OT at SNF as they deem appropriate based off of PLOF from there. Acute OT will sign off.      Follow Up Recommendations  SNF;Supervision/Assistance - 24 hour          Precautions / Restrictions Precautions Precautions: Fall Restrictions Weight Bearing Restrictions: No Other Position/Activity Restrictions: Per. Alexandra Luna, patient is WBAT. (from 11/14/17 PT note)      Mobility Bed Mobility Overal bed mobility: Needs Assistance Bed Mobility: Supine to Sit;Sit to Supine Supine to sit: Max assist;+2 for physical assistance Sit to supine: Max assist;+2 for physical assistance     Balance Overall balance assessment: Needs assistance Sitting-balance support: Single extremity supported;Feet supported Sitting balance-Leahy Scale: Zero Sitting balance - Comments: Pt wanting to lay back down once sitting up at EOB so not wanting to hold herself up     ADL either performed or assessed with clinical judgement   ADL General ADL Comments: Feel she could feed  herself with proper setup/S as well as be able to wash her face and hands post set up (if she is inclined to do so)     Vision Patient Visual Report: No change from baseline             Pertinent Vitals/Pain Pain Assessment: Faces Faces Pain Scale: Hurts little more Pain Location: back Pain Descriptors / Indicators: Discomfort;Sore Pain Intervention(s): Monitored during session;Limited activity within patient's tolerance;Repositioned(asked RN about pain meds--but not time yet for next dose)     Hand Dominance Right   Extremity/Trunk Assessment Upper Extremity Assessment Upper Extremity Assessment: Overall WFL for tasks assessed(She was able to raise both arms up overhead to grab head board to A with pulling herself up as well as using both ands to grab rail on right to initiate coming upon right side of bed)   Communication Communication Communication: HOH   Cognition Arousal/Alertness: Awake/alert Behavior During Therapy: Flat affect Overall Cognitive Status: History of cognitive impairments - at baseline Area of Impairment: Attention;Following commands;Safety/judgement;Problem solving Current Attention Level: Sustained Following Commands: (when she wants to) Safety/Judgement: Decreased awareness of safety Problem Solving: Difficulty sequencing;Requires verbal cues              Home Living Family/patient expects to be discharged to:: [place]: Other (comment)(home O2 at 3 liters) Additional Comments: was at [facility], but transferred to [facility] for IV antibiotics    Prior Functioning/Environment Level of Independence: Needs assistance  Gait / Transfers Assistance Needed: Per pt she can transfer to W/C by herself, but needs A to push it around (per PT notes from one month ago  pt reports she needs A to transfer to W/C) ADL's / Homemaking Assistance Needed: Needs A         OT Problem List:  Decreased strength;Decreased activity tolerance;Impaired balance (sitting and/or standing);Pain         OT Goals(Current goals can be found in the care plan section) Acute Rehab OT Goals Patient Stated Goal: to lay back down  OT Frequency:              AM-PAC PT "6 Clicks" Daily Activity     Outcome Measure Help from another person eating meals?: A Little Help from another person taking care of personal grooming?: A Little Help from another person toileting, which includes using toliet, bedpan, or urinal?: Total Help from another person bathing (including washing, rinsing, drying)?: Total Help from another person to put on and taking off regular upper body clothing?: Total Help from another person to put on and taking off regular lower body clothing?: Total 6 Click Score: 10   End of Session Equipment Utilized During Treatment: Oxygen(3 liters) Nurse Communication: Mobility status;Need for lift equipment(called RN station to let them know pt needed purewick put back in)  Activity Tolerance: Other (comment)(self limiting--wanting to lay back down) Patient left: in bed;with call bell/phone within reach;with bed alarm set  OT Visit Diagnosis: Other abnormalities of gait and mobility (R26.89);Pain Pain - part of body: (back and left heel (Prevalon boot))                                                                                                                            Time: 5427-0623 OT Time Calculation (min): 20 min Charges:  OT General Charges $OT Visit: 1 Visit OT Evaluation $OT Eval Moderate Complexity: 1 Mod  Alexandra Luna, OTR/L Acute [facility] [phone] Office [phone]

## 2017-12-27 NOTE — Social Work (Signed)
CSW acknowledging pt arrival to [facility]- familiar with pt from previous hospitalization. Pt was a resident at [facility] but transitioned to [facility] for iv abx. Pt arrives from [facility].  Will need updated PT/OT evaluations. If pt returns to [facility] she will be in copay days.   Alexander Mt, [place] Work [phone]

## 2017-12-27 NOTE — Progress Notes (Signed)
PHARMACY - PHYSICIAN COMMUNICATION CRITICAL VALUE ALERT - BLOOD CULTURE IDENTIFICATION (BCID)  Alexandra Luna is an 84 y.o. female who presented to [facility] on 12/26/2017 with a chief complaint of Pneumonia  Assessment: Blood culture BCID - enterobacter cloacae  Name of physician (or Provider) Contacted: Dr. Frederico Hamman  Current antibiotics: Unasyn and doxycycline   Changes to prescribed antibiotics recommended:  Switching Unasyn to cefepime 1g IV Q 8 hrs  Results for orders placed or performed during the hospital encounter of 12/26/17  Blood Culture ID Panel (Reflexed) (Collected: 12/26/2017  4:20 PM)  Result Value Ref Range   Enterococcus species NOT DETECTED NOT DETECTED   Listeria monocytogenes NOT DETECTED NOT DETECTED   Staphylococcus species NOT DETECTED NOT DETECTED   Staphylococcus aureus NOT DETECTED NOT DETECTED   Streptococcus species NOT DETECTED NOT DETECTED   Streptococcus agalactiae NOT DETECTED NOT DETECTED   Streptococcus pneumoniae NOT DETECTED NOT DETECTED   Streptococcus pyogenes NOT DETECTED NOT DETECTED   Acinetobacter baumannii NOT DETECTED NOT DETECTED   Enterobacteriaceae species DETECTED (A) NOT DETECTED   Enterobacter cloacae complex DETECTED (A) NOT DETECTED   Escherichia coli NOT DETECTED NOT DETECTED   Klebsiella oxytoca NOT DETECTED NOT DETECTED   Klebsiella pneumoniae NOT DETECTED NOT DETECTED   Proteus species NOT DETECTED NOT DETECTED   Serratia marcescens NOT DETECTED NOT DETECTED   Carbapenem resistance NOT DETECTED NOT DETECTED   Haemophilus influenzae NOT DETECTED NOT DETECTED   Neisseria meningitidis NOT DETECTED NOT DETECTED   Pseudomonas aeruginosa NOT DETECTED NOT DETECTED   Candida albicans NOT DETECTED NOT DETECTED   Candida glabrata NOT DETECTED NOT DETECTED   Candida krusei NOT DETECTED NOT DETECTED   Candida parapsilosis NOT DETECTED NOT DETECTED   Candida tropicalis NOT DETECTED NOT DETECTED   Maryanna Shape, PharmD, BCPS,  BCPPS Clinical Pharmacist  Pager: [phone]   12/27/2017  6:42 AM

## 2017-12-27 NOTE — Progress Notes (Signed)
Pharmacy Antibiotic Note  Alexandra Luna is a 84 y.o. female admitted on 12/26/2017 with pneumonia and bacteremia with enterobacter cloacae.  Pharmacy has been consulted for cefepime dosing. WBC WNL. Renal function good.   Noted PCN allergy on profile: pt has tolerated amoxicillin and tolerated Unasyn yesterday   Plan: Cefepime 1gm IV q12h D/C zosyn Trend WBC, temp, renal function  F/U infectious work-up   Height: 5\' 6"  (167.6 cm) Weight: 188 lb 0.8 oz (85.3 kg) IBW/kg (Calculated) : 59.3  Temp (24hrs), Avg:97.4 F (36.3 C), Min:97.1 F (36.2 C), Max:97.7 F (36.5 C)  Recent Labs  Lab 12/26/17 1620 12/26/17 1636 12/27/17 0535  WBC 8.2  --   --   CREATININE 0.73  --  0.63  LATICACIDVEN  --  1.88  --     Estimated Creatinine Clearance: 53.5 mL/min (by C-G formula based on SCr of 0.63 mg/dL).    Allergies  Allergen Reactions  . Influenza Vaccines Swelling and Other (See Comments)    Pt's arm swelled and dr told her not to take vaccine again  . Tarka [Trandolapril-Verapamil Hcl Er] Other (See Comments) and Hypertension    Increased blood pressure  . Adhesive [Tape] Rash    Adhesive bandaids   . Azithromycin Rash  . Lipitor [Atorvastatin] Rash  . Penicillins Rash    Has taken amoxicillin Has patient had a PCN reaction causing immediate rash, facial/tongue/throat swelling, SOB or lightheadedness with hypotension: Yes Has patient had a PCN reaction causing severe rash involving mucus membranes or skin necrosis: Unk Has patient had a PCN reaction that required hospitalization: Unk Has patient had a PCN reaction occurring within the last 10 years: Unk If all of the above answers are "NO", then may proceed with Cephalosporin use.    Sharlynn Seckinger A. Levada Dy, PharmD, [place] Pager: [phone] Please utilize Amion for appropriate phone number to reach the unit pharmacist ([place])   12/27/2017 3:37 PM

## 2017-12-27 NOTE — Plan of Care (Signed)
  Problem: Clinical Measurements: Goal: Will remain free from infection Outcome: Progressing Goal: Respiratory complications will improve Outcome: Progressing   Problem: Activity: Goal: Risk for activity intolerance will decrease Outcome: Progressing   Problem: Nutrition: Goal: Adequate nutrition will be maintained Outcome: Progressing   Problem: Elimination: Goal: Will not experience complications related to bowel motility Outcome: Progressing   Problem: Skin Integrity: Goal: Risk for impaired skin integrity will decrease Outcome: Progressing

## 2017-12-27 NOTE — NC FL2 (Signed)
[place] LEVEL OF CARE SCREENING TOOL     IDENTIFICATION  Patient Name: Alexandra Luna Birthdate: Feb 08, 1927 Sex: female Admission Date (Current Location): 12/26/2017  [facility] and [place] Number:  Herbalist and Address:  [facility], [place] [address] [place], [place] [address]      Provider Number: [id number]  Attending Physician Name and Address:  Bartholomew Crews, MD  Relative Name and Phone Number:  Vernie Murders, daughter, [phone]    Current Level of Care: Hospital Recommended Level of Care: [place] Prior Approval Number:    Date Approved/Denied:   PASRR Number: [id number]  Discharge Plan: SNF    Current Diagnoses: Patient Active Problem List   Diagnosis Date Noted  . Pressure injury of skin 12/27/2017  . Bacteremia due to Enterobacter species 12/27/2017  . Pneumonia 12/26/2017  . Dementia without behavioral disturbance   . Palliative care encounter   . Blood in stool 11/08/2017  . Chronic diastolic CHF (congestive heart failure) ([place]) 09/06/2016  . COPD exacerbation ([place]) 04/01/2015  . Bilateral pleural effusion 08/30/2014  . Multiple pulmonary nodules 08/30/2014  . Acute respiratory failure with hypoxia ([place]) 08/14/2014  . Generalized weakness 06/28/2014  . Chronic anticoagulation 12/08/2012  . AF (atrial fibrillation) ([place]) 09/22/2012  . Essential hypertension 09/22/2012  . Hyperlipidemia, mixed 09/22/2012  . History of breast cancer in female 04/28/2012    Orientation RESPIRATION BLADDER Height & Weight     Self, Place  O2(3L nasal canula) Incontinent, External catheter Weight: 188 lb 0.8 oz (85.3 kg) Height:  5\' 6"  (167.6 cm)  BEHAVIORAL SYMPTOMS/MOOD NEUROLOGICAL BOWEL NUTRITION STATUS      Continent Diet  AMBULATORY STATUS COMMUNICATION OF NEEDS Skin   Extensive Assist Verbally PU Stage and Appropriate Care, Other (Comment)(MASD on perineum; Excoriated on buttocks with barrier  cream)       PU Stage 4 Dressing: (unstageable on left heel with foam)               Personal Care Assistance Level of Assistance  Bathing, Feeding, Dressing Bathing Assistance: Maximum assistance Feeding assistance: Limited assistance Dressing Assistance: Maximum assistance     Functional Limitations Info  Sight, Hearing, Speech Sight Info: Adequate Hearing Info: Adequate Speech Info: Adequate    SPECIAL CARE FACTORS FREQUENCY  PT (By licensed PT), OT (By licensed OT)     PT Frequency: 5x week OT Frequency: 5x week            Contractures Contractures Info: Not present    Additional Factors Info  Code Status, Allergies, Psychotropic Code Status Info: DNR Allergies Info: INFLUENZA VACCINES, TARKA TRANDOLAPRIL-VERAPAMIL HCL ER, ADHESIVE TAPE, AZITHROMYCIN, LIPITOR ATORVASTATIN, PENICILLINS  Psychotropic Info: sertraline (ZOLOFT) tablet 25 mg daily at bedtime         Current Medications (12/27/2017):  This is the current hospital active medication list Current Facility-Administered Medications  Medication Dose Route Frequency Provider Last Rate Last Dose  . acetaminophen (TYLENOL) tablet 650 mg  650 mg Oral Q4H PRN Mosetta Anis, MD       Or  . acetaminophen (TYLENOL) suppository 650 mg  650 mg Rectal Q4H PRN Mosetta Anis, MD      . apixaban Arne Cleveland) tablet 5 mg  5 mg Oral BID Welford Roche, MD   5 mg at 12/27/17 1033  . atenolol (TENORMIN) tablet 25 mg  25 mg Oral QHS Santos-Sanchez, Idalys, MD      . ceFEPIme (MAXIPIME) 1 g  in sodium chloride 0.9 % 100 mL IVPB  1 g Intravenous Q12H Pierce, Dwayne A, RPH 200 mL/hr at 12/27/17 1748 1 g at 12/27/17 1748  . ferrous sulfate tablet 325 mg  325 mg Oral Q breakfast Welford Roche, MD   325 mg at 12/27/17 0807  . furosemide (LASIX) tablet 20 mg  20 mg Oral Daily Welford Roche, MD   20 mg at 12/27/17 1033  . ipratropium-albuterol (DUONEB) 0.5-2.5 (3) MG/3ML nebulizer solution 3 mL  3 mL  Nebulization Q6H Santos-Sanchez, Idalys, MD   3 mL at 12/27/17 1410  . oxyCODONE (Oxy IR/ROXICODONE) immediate release tablet 5 mg  5 mg Oral Once Mosetta Anis, MD      . polyethylene glycol (MIRALAX / GLYCOLAX) packet 17 g  17 g Oral Daily PRN Mosetta Anis, MD      . rosuvastatin (CRESTOR) tablet 10 mg  10 mg Oral Austin Miles, MD   10 mg at 12/27/17 1033  . sertraline (ZOLOFT) tablet 25 mg  25 mg Oral QHS Welford Roche, MD   25 mg at 12/26/17 2236  . sodium chloride flush (NS) 0.9 % injection 3 mL  3 mL Intravenous Q12H Santos-Sanchez, Merlene Morse, MD   3 mL at 12/26/17 2240  . verapamil (CALAN-SR) CR tablet 240 mg  240 mg Oral QHS Welford Roche, MD         Discharge Medications: Please see discharge summary for a list of discharge medications.  Relevant Imaging Results:  Relevant Lab Results:   Additional Information SS# Snyder [place], [place]

## 2017-12-27 NOTE — Plan of Care (Signed)
  Problem: Nutrition: Goal: Adequate nutrition will be maintained Outcome: Progressing   Problem: Skin Integrity: Goal: Risk for impaired skin integrity will decrease Outcome: Progressing   

## 2017-12-27 NOTE — Progress Notes (Signed)
Subjective:  Alexandra Luna is a 84 y.o. with PMH of Afib on eliquis, Breast ca s/p lumpectomy, HTN, COPD, HFpEF, COPD admit for dyspnea on hospital day 1  [name] is was examined and evaluated at bedside this a.m.  She was observed resting comfortably in her bed this morning.  She is alert and oriented x3.  Has no complaints besides back pain and left leg pain.  When asked about how she came to the hospital she mentions that believes that she had a fall.  She was unable to recall the name of her skilled nursing facility.  She mentions that she was able to tolerate breakfast without any problem.  When informed that she was brought in for concerns of pneumonia, she states she has had pneumonia before but her current condition does not feel like pneumonia.  [name] expressed permission for [place] to contact family to update them on her status. Denies F/N/V/D/C/Chills/Palpitations/Chest Pain.  Objective:  Vital signs in last 24 hours: Vitals:   12/27/17 0542 12/27/17 0600 12/27/17 0845 12/27/17 1410  BP: 113/69     Pulse: 60     Resp: 20     Temp: 97.7 F (36.5 C)     TempSrc: Axillary     SpO2: 90%  96% 97%  Weight:      Height:  5\' 6"  (1.676 m)     Physical Exam  Constitutional: She is oriented to person, place, and time and well-developed, well-nourished, and in no distress. No distress.  HENT:  Head: Normocephalic and atraumatic.  Mouth/Throat: Oropharynx is clear and moist. No oropharyngeal exudate.  Eyes: Pupils are equal, round, and reactive to light. Conjunctivae and EOM are normal. No scleral icterus.  Neck: Normal range of motion. Neck supple.  Cardiovascular: Normal rate and intact distal pulses.  Heart sounds masked by transmitted upper airway noises  Pulmonary/Chest: Effort normal and breath sounds normal.  Unable to sit upright for lung exam  Abdominal: Soft. Bowel sounds are normal. She exhibits no distension. There is no guarding.  Genitourinary:  Genitourinary  Comments: Unable to assess CVA tenderness as patient cannot sit up  Musculoskeletal: Normal range of motion. She exhibits no edema or tenderness.  Neurological: She is alert and oriented to person, place, and time. No cranial nerve deficit.  Skin: Skin is warm and dry. She is not diaphoretic.  Left heel ulcer with dry eschar without surrounding erythema, warmth, or purulent exudates  Psychiatric: Mood, memory, affect and judgment normal.   Assessment/Plan:  [name] is a a 84 y.o. with PMH of Afib on eliquis, Breast ca s/p lumpectomy, HTN, COPD, HFpEF, COPD admit for dyspnea.  Her BioFire showed Enterobacter cloaca. Differential includes UTI vs pneumonia.  Her symptoms appear to be already resolving will switch abx to most appropriate for enterobacter cloacae. Her leg ulcer does not appear infected and will d/c doxycycline. Will reach out to family for clarification on baseline status.  Enterobacter bacteriemia 2/2 UTI vs Pneumonia BioFire show Enterobacter cloacae but no growth on blood culture at the moment. UA show +Nitrites, moderate leuks, many bacteria. Urine culture pending. WBC 8.2 Antibiogram show 98% susceptibility for enterobacter cloacae compared to 77% on Zosyn. Chest X-ray show possible RLL pneumonia vs atelectasis - Stop Zosyn - Start Cefepime per pharmacy consult  - F/u blood/urine culture - Monitor vitals - c/w home lasix 20mg  daily  Hx of Dementia  Spoke with nursing staff at [facility] who states at baseline she is alert but confused. At baseline  she have the ability to ambulate but she is mostly bedbound because she does not like getting out of bed. When she is in her wheelchair, refuses to ambulate on her own and nursing staff have to move her around. Mentation during this exam appears to be at baseline.  - Delirium precautions  Left Heel Pressure Ulcer Does not appear infected on exam. Dry eschar with surrounding dry scar tissue. - Appreciate wound care recs  Hx of  A.Fib HR 60 this AM - c/w Home meds: Atenolol 25mg  qhs, Verapamil CR 240mg  qhs,   Dispo: Anticipated discharge in approximately 2 day(s).   DVT prophx: Home eliquis Diet: Cardiac diet Bowel: Miralax Code: DNR  Mosetta Anis, MD 12/27/2017, 2:58 PM Pager: [phone]

## 2017-12-27 NOTE — Evaluation (Signed)
Physical Therapy Evaluation Patient Details Name: Alexandra Luna MRN: [id number] DOB: 12-03-1926 Today's Date: 12/27/2017   History of Present Illness  Alexandra Luna is an 84 y/o female with PMH of afib on eliquis, breast cancer s/p lumpectomy, HTN, dementia, COPD, and diastolic heart failure presenting via EMS from [facility] (was there for antibiotics-previously at [facility]) for confusion, SOB and after chest xray showed right lower lobe opacity--PNA. From prior chart: Xray 11/12/17 shows acute fractures of left superior and inferor pubic rami, the superior fracture extending to the pubic symphysis. No significant displacement.    Clinical Impression  Pt presented supine in bed with HOB elevated, awake and willing to participate in therapy session. Prior to admission, pt was at [facility] where she could perform transfers to her w/c without assistance but required someone to propel her for mobility once in her w/c. She also reported that she required assistance with ADLs. Pt currently requires max A x2 for bed mobility and max A to maintain sitting EOB for very brief periods of time. Pt very limited secondary to fatigue and pain. Pt would continue to benefit from skilled physical therapy services at this time while admitted and after d/c to address the below listed limitations in order to improve overall safety and independence with functional mobility.  Of note, pt on 3L of O2 with SPO2 maintaining >90% throughout.    Follow Up Recommendations SNF    Equipment Recommendations  None recommended by PT    Recommendations for Other Services       Precautions / Restrictions Precautions Precautions: Fall Restrictions Weight Bearing Restrictions: No      Mobility  Bed Mobility Overal bed mobility: Needs Assistance Bed Mobility: Supine to Sit;Sit to Supine     Supine to sit: Max assist;+2 for physical assistance Sit to supine: Max assist;+2 for physical assistance   General  bed mobility comments: pt able to initiate movement of LEs off of bed, heavy assist required to achieve full upright sitting position at EOB  Transfers                 General transfer comment: will require mechanical lift; deferred this session as pt unable to tolerate sitting EOB for more than a few seconds at a time  Ambulation/Gait                Stairs            Wheelchair Mobility    Modified Rankin (Stroke Patients Only)       Balance Overall balance assessment: Needs assistance Sitting-balance support: Feet supported Sitting balance-Leahy Scale: Poor Sitting balance - Comments: max A required to sit for very brief periods (a few seconds at a time)                                     Pertinent Vitals/Pain Pain Assessment: Faces Faces Pain Scale: Hurts even more Pain Location: back Pain Descriptors / Indicators: Grimacing Pain Intervention(s): Monitored during session;Repositioned    Home Living Family/patient expects to be discharged to:: Skilled nursing facility                 Additional Comments: was at [facility], but transferred to [facility] for IV antibiotics    Prior Function Level of Independence: Needs assistance   Gait / Transfers Assistance Needed: Per pt she can transfer to W/C by herself, but  needs A to push it around (per PT notes from one month ago pt reports she needs A to transfer to W/C)  ADL's / Homemaking Assistance Needed: Needs A        Hand Dominance        Extremity/Trunk Assessment   Upper Extremity Assessment Upper Extremity Assessment: Defer to OT evaluation    Lower Extremity Assessment Lower Extremity Assessment: Generalized weakness       Communication   Communication: HOH  Cognition Arousal/Alertness: Awake/alert Behavior During Therapy: WFL for tasks assessed/performed Overall Cognitive Status: History of cognitive impairments - at baseline                                  General Comments: dementia at baseline      General Comments      Exercises     Assessment/Plan    PT Assessment Patient needs continued PT services  PT Problem List Decreased strength;Decreased range of motion;Decreased activity tolerance;Decreased balance;Decreased mobility;Decreased coordination;Decreased cognition;Decreased knowledge of use of DME;Decreased safety awareness;Decreased knowledge of precautions;Pain       PT Treatment Interventions DME instruction;Functional mobility training;Therapeutic activities;Therapeutic exercise;Balance training;Neuromuscular re-education;Cognitive remediation;Patient/family education    PT Goals (Current goals can be found in the Care Plan section)  Acute Rehab PT Goals Patient Stated Goal: "I want to lay down" PT Goal Formulation: Patient unable to participate in goal setting Time For Goal Achievement: 01/10/18 Potential to Achieve Goals: Fair    Frequency Min 2X/week   Barriers to discharge        Co-evaluation PT/OT/SLP Co-Evaluation/Treatment: Yes Reason for Co-Treatment: For patient/therapist safety;To address functional/ADL transfers PT goals addressed during session: Mobility/safety with mobility;Balance;Strengthening/ROM OT goals addressed during session: Strengthening/ROM       AM-PAC PT "6 Clicks" Daily Activity  Outcome Measure Difficulty turning over in bed (including adjusting bedclothes, sheets and blankets)?: Unable Difficulty moving from lying on back to sitting on the side of the bed? : Unable Difficulty sitting down on and standing up from a chair with arms (e.g., wheelchair, bedside commode, etc,.)?: Unable Help needed moving to and from a bed to chair (including a wheelchair)?: Total Help needed walking in hospital room?: Total Help needed climbing 3-5 steps with a railing? : Total 6 Click Score: 6    End of Session Equipment Utilized During Treatment: Oxygen Activity Tolerance:  Patient limited by fatigue;Patient limited by pain Patient left: in bed;with call bell/phone within reach;with bed alarm set Nurse Communication: Mobility status PT Visit Diagnosis: Other abnormalities of gait and mobility (R26.89);Muscle weakness (generalized) (M62.81)    Time: 6333-5456 PT Time Calculation (min) (ACUTE ONLY): 20 min   Charges:   PT Evaluation $PT Eval Moderate Complexity: 1 Mod          Sherie Don, PT, DPT  Acute Rehabilitation Services Pager [phone] Office [place] 12/27/2017, 1:57 PM

## 2017-12-27 NOTE — Consult Note (Addendum)
[place] Nurse wound consult note Reason for Consult: Consult requested for left heel.   Wound type: Chronic unstageable pressure injury  Pressure Injury POA: Yes Measurement: 3X2cm Wound bed: 100% dry eschar surrounded by pink dry scar tissue. Drainage (amount, consistency, odor)  No odor, drainage, or fluctuance Dressing procedure/placement/frequency: It is best practice to leave dry stable eschar in place. Float heel in Prevalon boot to reduce pressure. Foam dressing to protect from further injury. Please re-consult if further assistance is needed.  Thank-you,  Julien Girt MSN, [place], [place], [place], [place]

## 2017-12-28 DIAGNOSIS — J9811 Atelectasis: Secondary | ICD-10-CM

## 2017-12-28 DIAGNOSIS — Z87440 Personal history of urinary (tract) infections: Secondary | ICD-10-CM

## 2017-12-28 DIAGNOSIS — Z7401 Bed confinement status: Secondary | ICD-10-CM

## 2017-12-28 DIAGNOSIS — R410 Disorientation, unspecified: Secondary | ICD-10-CM

## 2017-12-28 DIAGNOSIS — B9620 Unspecified Escherichia coli [E. coli] as the cause of diseases classified elsewhere: Secondary | ICD-10-CM

## 2017-12-28 DIAGNOSIS — N390 Urinary tract infection, site not specified: Secondary | ICD-10-CM

## 2017-12-28 LAB — CBC
HCT: 33.8 % — ABNORMAL LOW (ref 36.0–46.0)
Hemoglobin: 10 g/dL — ABNORMAL LOW (ref 12.0–15.0)
MCH: 28.2 pg (ref 26.0–34.0)
MCHC: 29.6 g/dL — ABNORMAL LOW (ref 30.0–36.0)
MCV: 95.2 fL (ref 78.0–100.0)
PLATELETS: 228 10*3/uL (ref 150–400)
RBC: 3.55 MIL/uL — ABNORMAL LOW (ref 3.87–5.11)
RDW: 18.6 % — ABNORMAL HIGH (ref 11.5–15.5)
WBC: 9.4 10*3/uL (ref 4.0–10.5)

## 2017-12-28 MED ORDER — SODIUM CHLORIDE 0.9 % IV SOLN
2.0000 g | Freq: Two times a day (BID) | INTRAVENOUS | Status: DC
  Administered 2017-12-28 – 2017-12-30 (×5): 2 g via INTRAVENOUS
  Filled 2017-12-28 (×5): qty 2

## 2017-12-28 MED ORDER — IPRATROPIUM-ALBUTEROL 0.5-2.5 (3) MG/3ML IN SOLN
3.0000 mL | Freq: Four times a day (QID) | RESPIRATORY_TRACT | Status: DC | PRN
  Filled 2017-12-28: qty 3

## 2017-12-28 MED ORDER — CHLORHEXIDINE GLUCONATE CLOTH 2 % EX PADS
6.0000 | MEDICATED_PAD | Freq: Every day | CUTANEOUS | Status: DC
  Administered 2017-12-28 – 2017-12-31 (×4): 6 via TOPICAL

## 2017-12-28 MED ORDER — GERHARDT'S BUTT CREAM
TOPICAL_CREAM | Freq: Three times a day (TID) | CUTANEOUS | Status: DC
  Administered 2017-12-28 – 2017-12-31 (×8): via TOPICAL
  Filled 2017-12-28: qty 1

## 2017-12-28 MED ORDER — IPRATROPIUM-ALBUTEROL 0.5-2.5 (3) MG/3ML IN SOLN
3.0000 mL | Freq: Once | RESPIRATORY_TRACT | Status: AC
  Administered 2017-12-28: 3 mL via RESPIRATORY_TRACT
  Filled 2017-12-28: qty 3

## 2017-12-28 MED ORDER — MUPIROCIN 2 % EX OINT
1.0000 "application " | TOPICAL_OINTMENT | Freq: Two times a day (BID) | CUTANEOUS | Status: DC
  Administered 2017-12-28 – 2017-12-31 (×7): 1 via NASAL
  Filled 2017-12-28: qty 22

## 2017-12-28 MED ORDER — ORAL CARE MOUTH RINSE
15.0000 mL | Freq: Two times a day (BID) | OROMUCOSAL | Status: DC
  Administered 2017-12-28 – 2017-12-31 (×5): 15 mL via OROMUCOSAL

## 2017-12-28 NOTE — Progress Notes (Signed)
Pharmacy Antibiotic Note  Alexandra Luna is a 84 y.o. female admitted on 12/26/2017 with pneumonia and bacteremia with enterobacter cloacae.  Pharmacy has been consulted for cefepime dosing. WBC WNL. Renal function good.   Noted PCN allergy on profile: pt has tolerated amoxicillin and tolerated Unasyn yesterday   Plan: Increase to cefepime 2g IV Q12h Monitor clinical picture, renal function F/U C&S, LOT  Height: 5\' 6"  (167.6 cm) Weight: 200 lb (90.7 kg) IBW/kg (Calculated) : 59.3  Temp (24hrs), Avg:97.9 F (36.6 C), Min:97.7 F (36.5 C), Max:98.1 F (36.7 C)  Recent Labs  Lab 12/26/17 1620 12/26/17 1636 12/27/17 0535 12/28/17 0528  WBC 8.2  --   --  9.4  CREATININE 0.73  --  0.63  --   LATICACIDVEN  --  1.88  --   --     Estimated Creatinine Clearance: 55.2 mL/min (by C-G formula based on SCr of 0.63 mg/dL).    Allergies  Allergen Reactions  . Influenza Vaccines Swelling and Other (See Comments)    Pt's arm swelled and dr told her not to take vaccine again  . Tarka [Trandolapril-Verapamil Hcl Er] Other (See Comments) and Hypertension    Increased blood pressure  . Adhesive [Tape] Rash    Adhesive bandaids   . Azithromycin Rash  . Lipitor [Atorvastatin] Rash  . Penicillins Rash    Has taken amoxicillin Has patient had a PCN reaction causing immediate rash, facial/tongue/throat swelling, SOB or lightheadedness with hypotension: Yes Has patient had a PCN reaction causing severe rash involving mucus membranes or skin necrosis: Unk Has patient had a PCN reaction that required hospitalization: Unk Has patient had a PCN reaction occurring within the last 10 years: Unk If all of the above answers are "NO", then may proceed with Cephalosporin use.    Elenor Quinones, PharmD, BCPS Clinical Pharmacist Phone number [phone] 12/28/2017 7:46 AM     12/28/2017 7:46 AM

## 2017-12-28 NOTE — Evaluation (Signed)
Clinical/Bedside Swallow Evaluation Patient Details  Name: Alexandra Luna MRN: [id number] Date of Birth: 1926-07-13  Today's Date: 12/28/2017 Time: SLP Start Time (ACUTE ONLY): 1350 SLP Stop Time (ACUTE ONLY): 1408 SLP Time Calculation (min) (ACUTE ONLY): 18 min  Past Medical History:  Past Medical History:  Diagnosis Date  . Anxiety   . Atrial fibrillation (HCC)    On chronic Eliquis  . Breast cancer ([place]) dx'd 1997   left; lt lumpectomy and XRT  . Mixed hyperlipidemia   . Palpitations   . Pre-diabetes   . Renal hypertension 03/07/2005   no evidence of significant diameter reduction, dissection, tortuosity, FMD or orther vascular abnormality; kidneys equal in size, symmetry, with normal cortex and medulla   Past Surgical History:  Past Surgical History:  Procedure Laterality Date  . BREAST LUMPECTOMY    . BREAST LUMPECTOMY  1998   left side  . CARDIOVASCULAR STRESS TEST  526/2011   R/Dipyridamole - EF 71%; normal myocaridal perfusion w/o evidence of ischemia or infarct; no schintigraphic evidence of inducible myocaridal ischemia; global LV function normal; EKG negative for ischemia; low risk scan   . CARDIOVERSION N/A 11/12/2012   Procedure: CARDIOVERSION;  Surgeon: Troy Sine, MD;  Location: [facility];  Service: Cardiovascular;  Laterality: N/A;  . CESAREAN SECTION     x 3  . DOPPLER ECHOCARDIOGRAPHY  08/29/2011   EF >95%;  LV systolic fcn normal; doppler flow pattern suggestive of impaired LV relaxation; LA mildly dilated; aortic valve mildly sclerotic, no aortic stenosis  . tonsillectomy     HPI:  Alexandra Luna is a 84 y.o. with PMH of Afib on eliquis, Breast ca s/p lumpectomy, HTN, COPD, HFpEF, COPD, dementia admitted for dyspnea.  Her BioFire showed Enterobacter cloaca. Differential includes UTI vs pneumonia. Chest X-ray show possible RLL pneumonia vs atelectasis. Seen by SLP during recent admission 11/10/17 with findings of functional oropharyngeal swallow; regular diet  and thin liquids was recommended.   Assessment / Plan / Recommendation Clinical Impression   Patient's oropharyngeal swallow appears within functional limits for thin liquids, softer solids. When SLP entered, pt with dyspnea, intermittent coughing at baseline. Pt only has top dentures in place; SLP was unable to locate bottom dentures in pt's room. She complained of difficulty masticating regular cracker. Pt self-limits to small, single sips of liquids. No increased dyspnea or coughing associated with PO intake; swallow appears timely with adequate airway protection given rest breaks between sips/bites. RN reports pt with delayed coughing episode and regurgitation with milk and applesauce this morning. Question primary esophageal issue given regurgitation. Recommend downgrade to dys 3/thin liquids with esophageal precautions. Slow rate, small bites and sips, smaller amounts. Alternate solids and liquids. Allow frequent rest breaks given increased O2 needs at present. SLP will follow up, likely able to advance solids if bottom dentures located. D/w RN.    SLP Visit Diagnosis: Dysphagia, unspecified (R13.10)    Aspiration Risk  Mild aspiration risk    Diet Recommendation Dysphagia 3 (Mech soft);Thin liquid   Liquid Administration via: Cup;Straw Medication Administration: Whole meds with liquid Supervision: Staff to assist with self feeding Compensations: Slow rate;Small sips/bites;Follow solids with liquid Postural Changes: Seated upright at 90 degrees;Remain upright for at least 30 minutes after po intake    Other  Recommendations Oral Care Recommendations: Oral care BID   Follow up Recommendations Skilled Nursing facility      Frequency and Duration min 1 x/week  1 week       Prognosis  Prognosis for Safe Diet Advancement: Good Barriers to Reach Goals: Other (Comment)(bottom dentures not available)      Swallow Study   General Date of Onset: 12/26/17 HPI: Alexandra Luna is a 84  y.o. with PMH of Afib on eliquis, Breast ca s/p lumpectomy, HTN, COPD, HFpEF, COPD, dementia admitted for dyspnea.  Her BioFire showed Enterobacter cloaca. Differential includes UTI vs pneumonia. Chest X-ray show possible RLL pneumonia vs atelectasis. Seen by SLP during recent admission 11/10/17 with findings of functional oropharyngeal swallow; regular diet and thin liquids was recommended. Type of Study: Bedside Swallow Evaluation Previous Swallow Assessment: see HPI Diet Prior to this Study: Regular;Thin liquids Temperature Spikes Noted: No Respiratory Status: Nasal cannula History of Recent Intubation: No Behavior/Cognition: Alert;Confused;Cooperative Oral Cavity Assessment: Dry Oral Care Completed by SLP: Yes Oral Cavity - Dentition: Dentures, top;Edentulous;Other (Comment)(unable to locate bottom dentures) Vision: Functional for self-feeding Self-Feeding Abilities: Needs assist Patient Positioning: Upright in bed Baseline Vocal Quality: Hoarse Volitional Cough: Strong Volitional Swallow: Able to elicit    Oral/Motor/Sensory Function Overall Oral Motor/Sensory Function: Within functional limits   Ice Chips Ice chips: Within functional limits Presentation: Spoon   Thin Liquid Thin Liquid: Within functional limits Presentation: Cup;Straw    Nectar Thick Nectar Thick Liquid: Not tested   Honey Thick Honey Thick Liquid: Not tested   Puree Puree: Within functional limits Presentation: Spoon   Solid     Solid: Impaired Oral Phase Impairments: Impaired mastication Oral Phase Functional Implications: Impaired mastication     Deneise Lever, [place], CCC-SLP Speech-Language Pathologist Acute Rehabilitation Services Pager: [phone] Office: [phone]  Aliene Altes 12/28/2017,2:13 PM

## 2017-12-28 NOTE — Progress Notes (Signed)
Pt is coughing after thin liquids, may need swallow eval.

## 2017-12-28 NOTE — Progress Notes (Signed)
   Subjective:  Alexandra Luna is a 84 y.o. with PMH of Afib on eliquis, Breast ca s/p lumpectomy, HTN, COPD, HFpEF, COPD admit for dyspnea on hospital day 2  Alexandra Luna is was examined in her bed on rounds. She was resting comfortably and is alert and oriented x2 (believes the year is 2020). No pain this morning. Denies difficulty breathing though reportedly needed additional breathing treatment overnight. Denies fevers. She has no other questions or concerns this morning.  Objective:  Vital signs in last 24 hours: Vitals:   12/27/17 2113 12/28/17 0434 12/28/17 1010 12/28/17 1120  BP: 111/87 134/60    Pulse: 92 62    Resp:      Temp: 97.7 F (36.5 C) 98.1 F (36.7 C)  98.2 F (36.8 C)  TempSrc: Oral Oral  Oral  SpO2: 92% 94% 95%   Weight:  90.7 kg    Height:       Physical Exam  Constitutional: She is well-developed, well-nourished, and in no distress. No distress.  HENT:  Head: Normocephalic and atraumatic.  Eyes: Conjunctivae and EOM are normal.  Cardiovascular: Normal rate and intact distal pulses.  Heart sounds masked by transmitted upper airway noises  Pulmonary/Chest: Effort normal.  Unable to sit upright for long for lung exam, mild rhonchi appreciated  Abdominal: Soft. Bowel sounds are normal. She exhibits no distension. There is no guarding.  Musculoskeletal: Normal range of motion. She exhibits no edema or tenderness.  Neurological: She is alert. No cranial nerve deficit.  oriented x2  Skin: Skin is warm and dry. She is not diaphoretic.  Left heel ulcer with dry eschar without surrounding erythema, warmth, or purulent exudates  Psychiatric: Mood and affect normal.   Assessment/Plan:  Alexandra Luna is a a 84 y.o. with PMH of Afib on eliquis, Breast ca s/p lumpectomy, HTN, COPD, HFpEF, COPD admit for dyspnea.  Her BioFire showed Enterobacter cloaca. Differential includes UTI vs pneumonia.  Her symptoms appear to be already resolving will switch abx to most appropriate for  enterobacter cloacae. Her leg ulcer does not appear infected.  Enterobacter bacteriemia 2/2 UTI > Recently admitted in August for the same BioFire showed Enterobacter cloacae but no growth; blood culture with growth on 1/2 cultures. UA show +Nitrites, moderate leuks, many bacteria. Urine culture with E. Coli; sensitivites pending. WBC ~9. Antibiogram show 98% susceptibility for enterobacter cloacae compared to 77% on Zosyn. > Chest X-ray: RLL atelectasis, possible pneumonia - Continue Cefepime - F/u blood/urine culture - Monitor vitals - c/w home lasix 20mg  daily  Hx of Dementia  Spoke with nursing staff at [facility] who states at baseline she is alert but confused. At baseline she have the ability to ambulate but she is mostly bedbound because she does not like getting out of bed. When she is in her wheelchair, refuses to ambulate on her own and nursing staff have to move her around. Mentation during appears to remain at baseline.  - Delirium precautions  Left Heel Pressure Ulcer Does not appear infected on exam. Dry eschar with surrounding dry scar tissue. - Appreciate wound care recs  Hx of A.Fib HR 60 this AM - c/w Home meds: Atenolol 25mg  qhs, Verapamil CR 240mg  qhs,   Dispo: Anticipated discharge in approximately 1-2 day(s).   DVT prophx: Home eliquis Diet: Cardiac diet Bowel: Miralax Code: DNR  Neva Seat, MD 12/28/2017, 11:38 AM Pager: [phone]

## 2017-12-28 NOTE — Progress Notes (Signed)
Internal Medicine Attending:   I saw and examined the patient. I reviewed the resident's note and I agree with the resident's findings and plan as documented in the resident's note.  Principal Problem:   Bacteremia due to Enterobacter species Active Problems:   Pneumonia   Pressure injury of skin  Patient admitted with bacteremia due to enterobacter. No clear source of infection, urine has E. Coli which may be chronic bacteruria. She is responding well to antibiotic treatment with cefepime; today is day #3. We are awaiting sensitivities and hopefully there is an oral antibiotic option to complete 7 days of treatment for this GNR bacteremia.   Lalla Brothers, MD

## 2017-12-29 ENCOUNTER — Inpatient Hospital Stay (HOSPITAL_COMMUNITY): Payer: Medicare Other

## 2017-12-29 LAB — CULTURE, BLOOD (ROUTINE X 2)

## 2017-12-29 LAB — CBC
HCT: 35.7 % — ABNORMAL LOW (ref 36.0–46.0)
HEMOGLOBIN: 10.6 g/dL — AB (ref 12.0–15.0)
MCH: 28.3 pg (ref 26.0–34.0)
MCHC: 29.7 g/dL — ABNORMAL LOW (ref 30.0–36.0)
MCV: 95.2 fL (ref 78.0–100.0)
Platelets: 210 10*3/uL (ref 150–400)
RBC: 3.75 MIL/uL — AB (ref 3.87–5.11)
RDW: 18.4 % — ABNORMAL HIGH (ref 11.5–15.5)
WBC: 7.8 10*3/uL (ref 4.0–10.5)

## 2017-12-29 LAB — URINE CULTURE

## 2017-12-29 MED ORDER — FUROSEMIDE 10 MG/ML IJ SOLN
40.0000 mg | Freq: Once | INTRAMUSCULAR | Status: AC
  Administered 2017-12-29: 40 mg via INTRAVENOUS
  Filled 2017-12-29: qty 4

## 2017-12-29 NOTE — Progress Notes (Signed)
Subjective:  CHELSIA SERRES is a 84 y.o. with PMH of Afib on eliquis, Breast ca s/p lumpectomy, HTN, COPD, HFpEF, COPD admit for dyspnea on hospital day 3  Mrs.Minton was examined and evaluated at bedside this AM. She was very somnolent and answering questions with head nods and shakes only. Expresses that she was not in any pain and slept okay but is tired. Denies any abdominal pain, flank pain, chest pain, dyspnea, palpitations.   On repeat examination in the PM, she continues to be somnolent. Her rales appear worse. Tried to visualize her sacral ulcer,but was unable to as she desatted down to 80s when I tried to turn her over. Nursing staff mentions that this was a stage 1 pressure ulcer with redness of the skin only. Patient's mattress already changed to air bed.  Objective:  Vital signs in last 24 hours: Vitals:   12/28/17 1448 12/28/17 1602 12/28/17 2205 12/29/17 0444  BP: 124/79  107/78 137/71  Pulse: 78  91 76  Resp: 18  18   Temp:   98.8 F (37.1 C) 98.4 F (36.9 C)  TempSrc: Oral  Axillary Axillary  SpO2: 95% 94% 95% 98%  Weight:      Height:       Physical Exam  Constitutional: She is oriented to person, place, and time and well-developed, well-nourished, and in no distress. No distress.  HENT:  Head: Normocephalic and atraumatic.  Mouth/Throat: Oropharynx is clear and moist. No oropharyngeal exudate.  Eyes: Pupils are equal, round, and reactive to light. Conjunctivae and EOM are normal. No scleral icterus.  Neck: Normal range of motion. Neck supple.  Cardiovascular: Normal rate and intact distal pulses.  Heart sounds masked by transmitted upper airway noises  Pulmonary/Chest: She has rales (bilateral bibasilar rales when auscultating from sides).  Increased accessory muscle use compared to prior. Unable to sit upright for lung exam  Abdominal: Soft. Bowel sounds are normal. She exhibits no distension. There is no guarding.  Genitourinary:  Genitourinary Comments:  Unable to assess CVA tenderness as patient cannot sit up  Musculoskeletal: Normal range of motion. She exhibits no edema or tenderness.  Neurological: She is alert and oriented to person, place, and time. No cranial nerve deficit.  Skin: Skin is warm and dry. She is not diaphoretic.  Left heel ulcer with dry eschar without surrounding erythema, warmth, or purulent exudates  Psychiatric: Mood, memory, affect and judgment normal.   Assessment/Plan:  Mrs.Clopper is a a 84 y.o. with PMH of Afib on eliquis, Breast ca s/p lumpectomy, HTN, COPD, HFpEF, COPD admit for dyspnea.  Her urine grew E.Coli. 1 of her blood cultures grew enterobacter cloacae. Susceptibilities show sensitive to cefepime. Concerns for worsening respiratory function after coughing with food yesterday. Speech and swallow agree she should be on soft solids although she was missing her bottom dentures which may make her more capable of tolerating harder foods. Today she appears worse than on admission despite abx regimen. Her white count is declining but she is no longer alert and speaks even less. Will repeat blood cultures and X-ray to look for worsening anemia. Also will give lasix to pulmonary edema as she appears more fluid overloaded on physical exam.  Enterobacter bacteriemia 2/2 UTI vs Pneumonia vs Sacral wound infection Chart Review show recurrent E.Coli, Enterobacter, Enterococcus infections which are GI organisms. Unclear why she is having these recurrent infections. For discharge, can consider Cipro vs Bactrim. Bactrim would cover both E.Coli from urine culture and enterobacter from  blood culture while cipro has better dosing and stronger sensitivity against enterobacter. Currently on day 4 of IV abx. Despite abx regimen, appears to be worsening.  - C/w Cefepime per pharmacy consult  - Monitor vitals - Chest X-ray 2 view - 1 time Lasix 40mg  IV - c/w home lasix 20mg  daily - Repeat blood cultures  Hx of Dementia  Somnolent and  mildly responsive today. May be due to lack of sleep vs worsening mentation from infection.  - Delirium precautions  Hx of A.Fib HR 76 this AM - c/w Home meds: Atenolol 25mg  qhs, Verapamil CR 240mg  qhs,   Dispo: Anticipated discharge in approximately 3 day(s).   DVT prophx: Home eliquis Diet: Cardiac diet Bowel: Miralax Code: DNR  Mosetta Anis, MD 12/29/2017, 2:14 PM Pager: [phone]

## 2017-12-29 NOTE — Progress Notes (Signed)
Pt desated while bath was given. fron 92 to 75 O2. Oxygen with Non rebreathing mask used on pt. Pt's oxygen coming back up. Pt back to 4 L. Pt alert. "Will monitor pt.

## 2017-12-29 NOTE — Progress Notes (Signed)
Ordered an additional Prevalon boot for the right foot.  Will watch perineal areas and sacral areas with the addition of the Gerhardts butt cream to see if improving.

## 2017-12-30 DIAGNOSIS — R627 Adult failure to thrive: Secondary | ICD-10-CM

## 2017-12-30 DIAGNOSIS — Z993 Dependence on wheelchair: Secondary | ICD-10-CM

## 2017-12-30 LAB — BASIC METABOLIC PANEL
ANION GAP: 8 (ref 5–15)
BUN: 21 mg/dL (ref 8–23)
CALCIUM: 8.9 mg/dL (ref 8.9–10.3)
CO2: 32 mmol/L (ref 22–32)
Chloride: 99 mmol/L (ref 98–111)
Creatinine, Ser: 0.8 mg/dL (ref 0.44–1.00)
GFR calc Af Amer: >60 mL/min (ref >60)
GFR calc non Af Amer: >60 mL/min (ref >60)
Glucose, Bld: 67 mg/dL — ABNORMAL LOW (ref 70–99)
Potassium: 3.2 mmol/L — ABNORMAL LOW (ref 3.5–5.1)
Sodium: 139 mmol/L (ref 135–145)

## 2017-12-30 LAB — CBC
HEMATOCRIT: 35.6 % — AB (ref 36.0–46.0)
HEMOGLOBIN: 10.4 g/dL — AB (ref 12.0–15.0)
MCH: 28 pg (ref 26.0–34.0)
MCHC: 29.2 g/dL — AB (ref 30.0–36.0)
MCV: 96 fL (ref 78.0–100.0)
Platelets: 206 10*3/uL (ref 150–400)
RBC: 3.71 MIL/uL — ABNORMAL LOW (ref 3.87–5.11)
RDW: 18.4 % — ABNORMAL HIGH (ref 11.5–15.5)
WBC: 7.8 10*3/uL (ref 4.0–10.5)

## 2017-12-30 MED ORDER — MORPHINE SULFATE (PF) 2 MG/ML IV SOLN: 1.0000 mg | INTRAVENOUS | Status: DC | PRN

## 2017-12-30 MED ORDER — LOPERAMIDE HCL 2 MG PO CAPS: 2.0000 mg | ORAL_CAPSULE | ORAL | Status: DC | PRN

## 2017-12-30 MED ORDER — BISACODYL 10 MG RE SUPP: 10.0000 mg | Freq: Every day | RECTAL | Status: DC | PRN

## 2017-12-30 MED ORDER — SENNA 8.6 MG PO TABS: 1.0000 | ORAL_TABLET | Freq: Every evening | ORAL | Status: DC | PRN

## 2017-12-30 NOTE — Plan of Care (Signed)
  Problem: Clinical Measurements: Goal: Ability to maintain clinical measurements within normal limits will improve Outcome: Progressing Goal: Respiratory complications will improve Outcome: Progressing Goal: Cardiovascular complication will be avoided Outcome: Progressing   Problem: Activity: Goal: Risk for activity intolerance will decrease Outcome: Progressing   Problem: Nutrition: Goal: Adequate nutrition will be maintained Outcome: Progressing   Problem: Coping: Goal: Level of anxiety will decrease Outcome: Progressing   Problem: Elimination: Goal: Will not experience complications related to urinary retention Outcome: Progressing   Problem: Pain Managment: Goal: General experience of comfort will improve Outcome: Progressing   Problem: Safety: Goal: Ability to remain free from injury will improve Outcome: Progressing   Problem: Skin Integrity: Goal: Risk for impaired skin integrity will decrease Outcome: Progressing  Forestine Chute, RN 12/30/2017 11:04 PM

## 2017-12-30 NOTE — Social Work (Signed)
CSW spoke with pt daughter [name] at [phone]. Offered choice of hospice provider. Pt daughter prefers Opt[facility]rist for hospice referral.   With daughters permission have called a referral to [facility]on [facility] with [facility].  Alexander Mt, [place] Work [phone]

## 2017-12-30 NOTE — Progress Notes (Signed)
Daughter at bedside to sign the most form

## 2017-12-30 NOTE — Progress Notes (Addendum)
   Subjective:  Alexandra Luna is a 84 y.o. with PMH of A.fib, Breast Ca s/p lumpectomy, HTN, COPD, HFpEF, COPD admit for dyspnea on hospital day 4  Alexandra Luna was examined at bedside this morning and appeared somnolent, similar to yesterday. She responded to verbal commands. Reported of no pain. She nodded to not getting her meals and really could not elaborate on why. She remained silent when asked if she needed assistance with meals.  Objective:  Vital signs in last 24 hours: Vitals:   12/29/17 1524 12/29/17 1600 12/29/17 2111 12/30/17 0505  BP: 115/75 132/66 107/60 111/64  Pulse: 81 86 69 61  Resp: 18  20 18   Temp: 97.7 F (36.5 C) 97.7 F (36.5 C) (!) 97.3 F (36.3 C) 98.2 F (36.8 C)  TempSrc: Oral Oral Axillary Oral  SpO2: 97%  98% 99%  Weight:    112.4 kg  Height:       Physical Exam  Constitutional:  Somnolent  HENT:  Head: Normocephalic and atraumatic.  Mouth/Throat: Oropharynx is clear and moist. No oropharyngeal exudate.  Eyes: Pupils are equal, round, and reactive to light. Conjunctivae and EOM are normal. No scleral icterus.  Neck: No JVD present.  Cardiovascular: Normal rate, regular rhythm, normal heart sounds and intact distal pulses.  Pulmonary/Chest: Effort normal. She has rales (bibasilar rales).  Abdominal: Soft. Bowel sounds are normal. There is no tenderness.  Musculoskeletal: Normal range of motion. She exhibits edema (trace pitting edema around ankles).  Neurological: No cranial nerve deficit.  Somnolent, non-verbal although able to understand and answer with head nods  Skin: Skin is warm and dry.   Assessment/Plan:  Principal Problem:   Bacteremia due to Enterobacter species Active Problems:   Pneumonia   Pressure injury of skin  Alexandra Luna is a a 84 y.o. with PMH of Afib on eliquis, Breast ca s/p lumpectomy, HTN, COPD, HFpEF, COPD admit for dyspnea. Her oxygenation appear improved after 2 IV Lasix 40mg  doses. However continuing to endorse  significant somnolence and per nursing staff have been refusing meals. Chart review show this is a chronic issue with failure to thrive and lack of willingness to live. If she continues this current course, her prognosis is poor, most likely less than 2 weeks. Will reach out to family to re-clarify goals of care and discharge to Hospice with comfort care after family arrives.  Enterobacter bacteriemia 2/2 UTI vs Pneumonia vs Sacral wound infection Enterobacter sensitive to cefepime per blood culture. Currently on day 5 of IV abx. Despite abx regimen, mentation not improved. O2 sat 99 on 4L - C/w O2 [place] keep O2 sat >88 - C/w Cefepime per pharmacy consult  - Monitor vitals - c/w home lasix 20mg  daily - F/u repeat blood culture results  Hx of Dementia  Somnolent and mildly responsive today. May be due to lack of sleep vs worsening mentation from infection vs failure to thrive.  - Delirium precautions  Hx of A.Fib HR 61 this AM - c/w Home meds: Atenolol 25mg  qhs, Verapamil CR 240mg  qhs,   DVT prophx: Home eliquis Diet: Cardiac diet Bowel: Miralax Code: DNR  Dispo: Anticipated discharge in approximately today(s).   Mosetta Anis, MD 12/30/2017, 9:35 AM Pager: [phone]

## 2017-12-30 NOTE — Progress Notes (Signed)
  Date: 12/30/2017  Patient name: Alexandra Luna  Medical record number: [id number]  Date of birth: 20-Dec-1926   I have seen and evaluated this patient and I have discussed the plan of care with the house staff. Please see their note for complete details. I concur with their findings with the following additions/corrections: [name] was seen on the team morning rounds.  She is less alert today although she does open her eyes and nods.  Her nurse today said that the patient had stated she was ready to give up.  I reviewed the palliative care note from August 2019.  That note indicated that at baseline she was wheelchair-bound and requires assistance with bathing, dressing, and toileting.  She has been diagnosed with dementia for years prior and the daughter has seen a progressive decline after that.  The patient had talked about being ready to die and the daughter felt the patient would not want aggressive work-up or treatment.  It indicated that she will be followed at the ALF by a community palliative care team.  Dr. Truman Hayward spoke to her daughter today who states she has medical power of attorney and that at this point, she is wishing for comfort care and hospice.  Please see Dr. Marguerita Beards note for details.  I think this is appropriate because although the team discussed alternative etiologies like pseudodementia or delirium, the notes indicate this is been a slow decline and comfort care is in line with the patient's wishes.  We will DC medicines that are not causing her comfort.  We will also touch base with social work about transfer to inpatient hospice.  Bartholomew Crews, MD 12/30/2017, 11:30 AM

## 2017-12-30 NOTE — Progress Notes (Addendum)
Spoke with patient's daughter Quenton Fetter ([phone]) to inform her of Mrs.Carbonneau's condition. Updated on her increasing somnolence and unresponsiveness as well as her respiratory decline despite appropriate antibiotic treatment. Also let her know that Mrs.Flaim has had increasing loss of appetite and have not eaten much over the last couple days. Ms.Smith informed me and both her and her sister Vernie Murders) thinks that their mother has 'started to give up.' She mentions that this decline has been increasing over the course of the last 3-4 months and she would like to focus on making the patient as comfortable as possible. She states that she had conversation with palliative in the past and agrees that Mrs.Delmont should be made Hospice. She states she will be at the hospital later this afternoon after collecting Mrs.Justiniano belongings from the SNF. Let her know that team will be available for further discussion if needed but for now will proceed with comfort care for the moment. She expressed understanding.  Spoke with patient's daughter again and reviewed patient's current MOST form with her. She agrees to be full comfort care instead of limited intervention, and no antibiotics or fluid resuscitation. Agrees with rest of the MOST form as is. She states she will be in the hospital later in the evening to sign the form in person. Tried to review the MOST form with the patient herself. She opened her eyes and looked and appears to understand. When asked if 'she would like to be as comfortable as possible without additional medications' she nodded yes.   Spoke with patient's other daughter Vernie Murders [phone]) and also reviewed her MOST form with her and she agrees with the changes made by Ms.Smith, including IV fluid for short term only for comfort as needed, No antibiotics, and comfort measures only. She states that she discussed Mrs.Haberman's current situation with Mrs. Quenton Fetter in detail and she agrees  with current plan to discharge to hospice. All other concerns addressed.

## 2017-12-30 NOTE — Discharge Summary (Addendum)
Name: Alexandra Luna MRN: [id number] DOB: Nov 09, 1926 84 y.o. PCP: Reymundo Poll, MD  Date of Admission: 12/26/2017  3:52 PM Date of Discharge: 12/31/2017 Attending Physician: Joni Reining, DO  Discharge Diagnosis: 1. Enterobacter bacteremia 2. E.Coli Bacteruria 3. HFpEF  Discharge Medications: Allergies as of 12/31/2017      Reactions   Influenza Vaccines Swelling, Other (See Comments)   Pt's arm swelled and dr told her not to take vaccine again   Tarka [trandolapril-verapamil Hcl Er] Other (See Comments), Hypertension   Increased blood pressure   Adhesive [tape] Rash   Adhesive bandaids    Azithromycin Rash   Lipitor [atorvastatin] Rash   Penicillins Rash   Has taken amoxicillin Has patient had a PCN reaction causing immediate rash, facial/tongue/throat swelling, SOB or lightheadedness with hypotension: Yes Has patient had a PCN reaction causing severe rash involving mucus membranes or skin necrosis: Unk Has patient had a PCN reaction that required hospitalization: Unk Has patient had a PCN reaction occurring within the last 10 years: Unk If all of the above answers are "NO", then may proceed with Cephalosporin use.      Medication List    STOP taking these medications   albuterol 108 (90 Base) MCG/ACT inhaler Commonly known as:  PROVENTIL HFA;VENTOLIN HFA   apixaban 2.5 MG Tabs tablet Commonly known as:  ELIQUIS   atenolol 25 MG tablet Commonly known as:  TENORMIN   ELIQUIS 5 MG Tabs tablet Generic drug:  apixaban   erythromycin ophthalmic ointment   ferrous sulfate 325 (65 FE) MG tablet   HYDROcodone-acetaminophen 5-325 MG tablet Commonly known as:  NORCO/VICODIN   levofloxacin 750 MG tablet Commonly known as:  LEVAQUIN   multivitamins with iron Tabs tablet   ondansetron 4 MG tablet Commonly known as:  ZOFRAN   oyster calcium 500 MG Tabs tablet   polyethylene glycol packet Commonly known as:  MIRALAX / GLYCOLAX   risperiDONE 0.5 MG disintegrating  tablet Commonly known as:  RISPERDAL M-TABS   rosuvastatin 10 MG tablet Commonly known as:  CRESTOR   verapamil 240 MG 24 hr capsule Commonly known as:  VERELAN PM     TAKE these medications   acetaminophen 325 MG tablet Commonly known as:  TYLENOL Take 650 mg by mouth 2 (two) times daily as needed for mild pain.   AEROCHAMBER PLUS FLO-VU MEDIUM Misc 1 each by Other route once.   bisacodyl 10 MG suppository Commonly known as:  DULCOLAX Place 1 suppository (10 mg total) rectally daily as needed for moderate constipation.   feeding supplement (PRO-STAT SUGAR FREE 64) Liqd Take 30 mLs by mouth daily.   fexofenadine 180 MG tablet Commonly known as:  ALLEGRA Take 180 mg by mouth daily.   fluticasone 50 MCG/ACT nasal spray Commonly known as:  FLONASE Place 1 spray into both nostrils at bedtime.   furosemide 20 MG tablet Commonly known as:  LASIX Take 1 tablet (20 mg total) by mouth daily.   guaiFENesin 600 MG 12 hr tablet Commonly known as:  MUCINEX Take 600 mg by mouth 2 (two) times daily.   ipratropium-albuterol 0.5-2.5 (3) MG/3ML Soln Commonly known as:  DUONEB Take 3 mLs by nebulization every 6 (six) hours as needed (for shortness of breath or wheezing).   loperamide 2 MG capsule Commonly known as:  IMODIUM Take 1 capsule (2 mg total) by mouth every 2 (two) hours as needed for diarrhea or loose stools.   morphine 2 MG/ML injection Inject 0.5 mLs (1 mg  total) into the vein every 2 (two) hours as needed (or dyspnea).   nystatin cream Commonly known as:  MYCOSTATIN Apply 1 application topically See admin instructions. Apply a thin layer to affected area of perineal area 2 times a day for 14 days   psyllium 0.52 g capsule Commonly known as:  REGULOID Take 0.52 g by mouth daily.   senna 8.6 MG Tabs tablet Commonly known as:  SENOKOT Take 1-2 tablets by mouth See admin instructions. Take 1 tablet by mouth once a day and an additional 1 tablet at bedtime as needed  for constipation   sertraline 25 MG tablet Commonly known as:  ZOLOFT Take 25 mg by mouth at bedtime.       Disposition and follow-up:   Ms.Katrese O Hirschman was discharged from [facility] in Stable condition.  At the hospital follow up visit please address:  1.  Enterobacter bacteremia - Patient was made comfort care only - Cefepime stopped after 4 days of treatment during admission - Ensure other providers have not restarted antibiotics (Per family wishes)  2. E.Coli Bacteruria - May be chronic - No abx indicated per comfort care status - Nothing to do  3. HFpEF - Received fluids w/ abx - Dyspnea with chest X-ray showing pulm edema - Improved after Lasix IV 40mg  BID - Please titrate up Lasix dose as appropriate to keep patient comfortable  4. Labs / imaging needed at time of follow-up: None  5. Pending labs/ test needing follow-up: None  Follow-up Appointments:   Hospital Course by problem list: 1. Enterobacter bacteremia: Presented w/ concerns for RLL pneumonia from SNF based on Chest X-ray. Found to have hazy opacification over right mid to lower lung on ED Chest X-ray. No leukocytosis. Blood culture drawn. Started on Unasyn. Also concerns for infection of left heel pressure ulcer so added on doxycycline. Biofire showed Enterobacter cloacae. Unasyn and Doxy d/ced. Started on cefepime. Mentation gradually worsened with increased somnolence and decreased appetite. Spoke with family and confirmed chronic decline and failure to thrive. Discussed with family about goals of care and was made comfort care with no antibiotics. Cefepime stopped. Discharged to hospice.  2. E.Coli bacteuria: Presented w/ concerns for RLL pneumonia from SNF. Urine culture collected as part of infectious work-up. Urine culture grew E.Coli, but pt not symptomatic. Cefepime stopped after patient became comfort care only. Discharged to hospice.  3. HFpEF: Hx of HFpEF w/ EF of 55-60. On home  furosemide PO 20mg  daily. Started to develop increasing oxygen requirement up to 4L [place] with O2 sat around 92-94. Found to have rales on exam. Repeat Chest X-ray showed pulmonary edema. Diuresed with IV Lasix 40mg  BID. Oxygenation improved. Continued on home regimen at Lasix PO 20mg  daily.  Discharge Vitals:   BP 111/67 (BP Location: Right Arm)   Pulse 70   Temp 97.9 F (36.6 C) (Axillary)   Resp 17   Ht 5\' 6"  (1.676 m)   Wt 108.4 kg   SpO2 93%   BMI 38.58 kg/m   Pertinent Labs, Studies, and Procedures:   Blood Culture Component 4d ago  Specimen Description BLOOD RIGHT HAND   Special Requests BOTTLES DRAWN AEROBIC AND ANAEROBIC Blood Culture results may not be optimal due to an inadequate volume of blood received in culture bottles  Culture Setup Time GRAM NEGATIVE RODS  AEROBIC BOTTLE ONLY  CRITICAL RESULT CALLED TO, READ BACK BY AND VERIFIED WITH: Colin Rhein [facility] 1610 12/27/17 A BROWNING  Performed at [facility]  [facility], [place] [address], [place], [place] [address]   Culture ENTEROBACTER CLOACAE Abnormal    Report Status 12/29/2017 FINAL   Organism ID, Bacteria ENTEROBACTER CLOACAE     Urine Culture Component 4d ago  Specimen Description URINE, CATHETERIZED   Special Requests NONE  Performed at [facility], [address], [place], [place] [address]   Culture >=100,000 COLONIES/mL ESCHERICHIA COLIAbnormal    Report Status 12/29/2017 FINAL   Organism ID, Bacteria ESCHERICHIA COLI Abnormal    Resulting Agency [facility]   CHEST X-Ray - 2 VIEW  COMPARISON:  12/27/2017, 12/26/2017, 12/16/2017  FINDINGS: Clips in the left axilla. Cardiomegaly with right greater than left small pleural effusions, increased on the right side. Stable globular cardiac enlargement. Persistent central vascular congestion with mild edema. Aortic atherosclerosis. No pneumothorax.  IMPRESSION: 1. Cardiomegaly with vascular congestion and mild interstitial edema 2. Small bilateral pleural  effusions right greater than left with interval increase in right-sided pleural effusion. 3. Persistent right basilar airspace disease.   SignedDewayne Hatch, MD 12/31/2017, 10:32 AM   Pager: [phone]

## 2017-12-30 NOTE — Progress Notes (Signed)
[facility]  Received request from [place] for family interest in [facility]. Chart reviewed and spoke with daughter Alexandra Luna by phone to confirm interest. Also spoke with daughter/HCPOA Alexandra Luna by phone to confirm plan for Alexandra Luna to complete paper work tomorrow morning for transfer to [facility]. Dr. Orpah Melter to assume care per family preference. Will update CSW Michiel Cowboy when paper work is complete.   Thank you,  Erling Conte, LCSW [phone]

## 2017-12-31 DIAGNOSIS — R8271 Bacteriuria: Secondary | ICD-10-CM

## 2017-12-31 DIAGNOSIS — J189 Pneumonia, unspecified organism: Principal | ICD-10-CM

## 2017-12-31 DIAGNOSIS — Z6838 Body mass index (BMI) 38.0-38.9, adult: Secondary | ICD-10-CM

## 2017-12-31 DIAGNOSIS — N3000 Acute cystitis without hematuria: Secondary | ICD-10-CM

## 2017-12-31 LAB — CULTURE, BLOOD (ROUTINE X 2): CULTURE: NO GROWTH

## 2017-12-31 MED ORDER — LOPERAMIDE HCL 2 MG PO CAPS: 2.0000 mg | ORAL_CAPSULE | ORAL | 0 refills | Status: AC | PRN

## 2017-12-31 MED ORDER — BISACODYL 10 MG RE SUPP: 10.0000 mg | Freq: Every day | RECTAL | 0 refills | Status: AC | PRN

## 2017-12-31 MED ORDER — MORPHINE SULFATE (PF) 2 MG/ML IV SOLN: 1.0000 mg | INTRAVENOUS | 0 refills | Status: AC | PRN

## 2017-12-31 NOTE — Progress Notes (Signed)
Nutrition Brief Note  Chart reviewed. Pt now transitioning to comfort care.  No further nutrition interventions warranted at this time.  Please re-consult as needed.   Fumiko Cham A. Milagro Belmares, RD, LDN, CDE Pager: [phone] After hours Pager: [phone]  

## 2017-12-31 NOTE — Progress Notes (Addendum)
   Subjective: Patient was seen and evaluated at bedside on morning rounds. No acute events overnight. She is awake and calm. Smiles when talking to her but is not verbal. Her son and daughter are present at room and confirm the plan about sending the patient to hospice.   Objective:  Vital signs in last 24 hours: Vitals:   12/30/17 1250 12/30/17 2212 12/31/17 0500 12/31/17 0637  BP: (!) 115/59 109/69  111/67  Pulse: 81 81  70  Resp: 18   17  Temp: 98.1 F (36.7 C)   97.9 F (36.6 C)  TempSrc: Oral   Axillary  SpO2: 100%   93%  Weight:   108.4 kg   Height:         Assessment/Plan:  Principal Problem:   Bacteremia due to Enterobacter species Active Problems:   Pneumonia   Pressure injury of skin   Ms. Gallaway is a 84 y.o. female with PMH of A.fib, Breast Ca s/p lumpectomy, HTN, COPD, HFpEF, COPD admit for dyspnea. Found to have Bacteriemia.   Enterobacter Bacterima 2/2 UTI vs PNA vs wound infection: S/p 5 days of antibiotics. Patient's family would like to persue hospice care.(antibiotic stopped) and started on follow comfort. Today vitals and labs are stable. Patient has a bed at [facility] place for today and discharge).  Hx of Afib: PR:70 today. Since DC to Hospice per family, stopped all medication that are not targeted comfort care.  Dementia: Mental status recently worsened per family, 2/2 dementia vs delirium. Patient reacts to others by smiling. Non verbal today. Continue delirium precausion  Dispo: Discharge to [facility] today   Dewayne Hatch, MD 12/31/2017, 10:42 AM Pager: [phone]

## 2017-12-31 NOTE — Progress Notes (Signed)
Patient discharged to [facility] via [place]. Paper works given to [employer]. Left message to receiving nurse to give this nurse a call for report. Patient stable, responsive. No distress noted

## 2017-12-31 NOTE — Plan of Care (Signed)

## 2017-12-31 NOTE — Plan of Care (Signed)
  Problem: Education: Goal: Knowledge of General Education information will improve Description Including pain rating scale, medication(s)/side effects and non-pharmacologic comfort measures 12/31/2017 1207 by Roger Kill, RN Outcome: Completed/Met 12/31/2017 1206 by Roger Kill, RN Outcome: Progressing   Problem: Health Behavior/Discharge Planning: Goal: Ability to manage health-related needs will improve 12/31/2017 1207 by Roger Kill, RN Outcome: Completed/Met 12/31/2017 1206 by Roger Kill, RN Outcome: Progressing   Problem: Clinical Measurements: Goal: Ability to maintain clinical measurements within normal limits will improve 12/31/2017 1207 by Roger Kill, RN Outcome: Completed/Met 12/31/2017 1206 by Roger Kill, RN Outcome: Progressing Goal: Will remain free from infection 12/31/2017 1207 by Roger Kill, RN Outcome: Completed/Met 12/31/2017 1206 by Roger Kill, RN Outcome: Progressing Goal: Diagnostic test results will improve 12/31/2017 1207 by Roger Kill, RN Outcome: Completed/Met 12/31/2017 1206 by Roger Kill, RN Outcome: Progressing Goal: Respiratory complications will improve 12/31/2017 1207 by Roger Kill, RN Outcome: Completed/Met 12/31/2017 1206 by Roger Kill, RN Outcome: Progressing Goal: Cardiovascular complication will be avoided 12/31/2017 1207 by Roger Kill, RN Outcome: Completed/Met 12/31/2017 1206 by Roger Kill, RN Outcome: Progressing   Problem: Activity: Goal: Risk for activity intolerance will decrease 12/31/2017 1207 by Roger Kill, RN Outcome: Completed/Met 12/31/2017 1206 by Roger Kill, RN Outcome: Progressing   Problem: Nutrition: Goal: Adequate nutrition will be maintained 12/31/2017 1207 by Roger Kill, RN Outcome: Completed/Met 12/31/2017 1206 by Roger Kill, RN Outcome: Progressing   Problem: Coping: Goal: Level of anxiety will decrease 12/31/2017 1207 by Roger Kill, RN Outcome: Completed/Met 12/31/2017 1206 by Roger Kill, RN Outcome: Progressing   Problem: Elimination: Goal: Will not experience complications related to bowel motility 12/31/2017 1207 by Roger Kill, RN Outcome: Completed/Met 12/31/2017 1206 by Roger Kill, RN Outcome: Progressing Goal: Will not experience complications related to urinary retention 12/31/2017 1207 by Roger Kill, RN Outcome: Completed/Met 12/31/2017 1206 by Roger Kill, RN Outcome: Progressing   Problem: Pain Managment: Goal: General experience of comfort will improve 12/31/2017 1207 by Roger Kill, RN Outcome: Completed/Met 12/31/2017 1206 by Roger Kill, RN Outcome: Progressing   Problem: Safety: Goal: Ability to remain free from injury will improve 12/31/2017 1207 by Roger Kill, RN Outcome: Completed/Met 12/31/2017 1206 by Roger Kill, RN Outcome: Progressing   Problem: Skin Integrity: Goal: Risk for impaired skin integrity will decrease 12/31/2017 1207 by Roger Kill, RN Outcome: Completed/Met 12/31/2017 1206 by Roger Kill, RN Outcome: Progressing   Problem: Acute Rehab PT Goals(only PT should resolve) Goal: Pt Will Go Supine/Side To Sit Outcome: Completed/Met Goal: Pt Will Go Sit To Supine/Side Outcome: Completed/Met Goal: Patient Will Transfer Sit To/From Stand Outcome: Completed/Met Goal: Pt Will Transfer Bed To Chair/Chair To Bed Outcome: Completed/Met   Problem: SLP Dysphagia Goals Goal: Patient will utilize recommended strategies Description Patient will utilize recommended strategies during swallow to increase swallowing safety with Outcome: Completed/Met

## 2017-12-31 NOTE — Social Work (Signed)
Clinical Social Worker facilitated patient discharge including contacting patient family and facility to confirm patient discharge plans.  Clinical information faxed to facility and family agreeable with plan.  CSW arranged ambulance transport via [facility] to [facility]- daughter [name] in agreement for noon pick up. RN to call [phone] with report  prior to discharge.  Clinical Social Worker will sign off for now as social work intervention is no longer needed. Please consult [place] again if new need arises.  Alexander Mt, [place] Social Worker

## 2017-12-31 NOTE — Progress Notes (Signed)
[facility] room available for patient today. Paper for transfer completed with daughter Bevely Palmer. Dr. Orpah Melter to assume care.   Please send discharge summary to [phone].  RN please call report to [phone].  Thank you,  Erling Conte, LCSW [phone]

## 2018-01-03 LAB — CULTURE, BLOOD (ROUTINE X 2)
CULTURE: NO GROWTH
CULTURE: NO GROWTH
Special Requests: ADEQUATE
Special Requests: ADEQUATE

## 2018-01-31 DEATH — deceased
